# Patient Record
Sex: Male | Born: 1955 | Race: White | Hispanic: No | Smoking: Current every day smoker
Health system: Southern US, Community
[De-identification: ages and names within clinical notes are randomized; demographics above are authoritative.]

## PROBLEM LIST (undated history)

## (undated) DIAGNOSIS — M069 Rheumatoid arthritis, unspecified: Secondary | ICD-10-CM

## (undated) DIAGNOSIS — M359 Systemic involvement of connective tissue, unspecified: Secondary | ICD-10-CM

## (undated) DIAGNOSIS — I219 Acute myocardial infarction, unspecified: Secondary | ICD-10-CM

## (undated) DIAGNOSIS — E785 Hyperlipidemia, unspecified: Secondary | ICD-10-CM

## (undated) HISTORY — PX: LEG SURGERY: SHX1003

---

## 2005-02-16 ENCOUNTER — Emergency Department: Payer: Self-pay | Admitting: Internal Medicine

## 2005-02-23 ENCOUNTER — Emergency Department: Payer: Self-pay | Admitting: Internal Medicine

## 2005-03-21 ENCOUNTER — Emergency Department: Payer: Self-pay | Admitting: Emergency Medicine

## 2005-03-25 ENCOUNTER — Emergency Department: Payer: Self-pay | Admitting: General Practice

## 2005-04-10 ENCOUNTER — Emergency Department: Payer: Self-pay | Admitting: Emergency Medicine

## 2006-05-11 ENCOUNTER — Emergency Department: Payer: Self-pay | Admitting: General Practice

## 2007-11-09 ENCOUNTER — Emergency Department: Payer: Self-pay | Admitting: Emergency Medicine

## 2008-07-09 ENCOUNTER — Emergency Department: Payer: Self-pay | Admitting: Emergency Medicine

## 2009-01-24 ENCOUNTER — Emergency Department: Payer: Self-pay | Admitting: Emergency Medicine

## 2009-03-06 ENCOUNTER — Emergency Department: Payer: Self-pay | Admitting: Emergency Medicine

## 2009-04-24 ENCOUNTER — Emergency Department: Payer: Self-pay | Admitting: Emergency Medicine

## 2009-07-18 ENCOUNTER — Emergency Department: Payer: Self-pay | Admitting: Internal Medicine

## 2010-01-25 ENCOUNTER — Emergency Department: Payer: Self-pay | Admitting: Emergency Medicine

## 2010-04-14 ENCOUNTER — Emergency Department (HOSPITAL_COMMUNITY): Admission: EM | Admit: 2010-04-14 | Discharge: 2010-04-14 | Payer: Self-pay | Admitting: Emergency Medicine

## 2010-04-17 ENCOUNTER — Emergency Department: Payer: Self-pay | Admitting: Emergency Medicine

## 2010-04-21 ENCOUNTER — Emergency Department (HOSPITAL_COMMUNITY): Admission: EM | Admit: 2010-04-21 | Discharge: 2010-04-21 | Payer: Self-pay | Admitting: Emergency Medicine

## 2010-04-21 ENCOUNTER — Emergency Department: Payer: Self-pay | Admitting: Emergency Medicine

## 2010-06-05 ENCOUNTER — Emergency Department: Payer: Self-pay | Admitting: Emergency Medicine

## 2010-12-22 ENCOUNTER — Emergency Department: Payer: Self-pay | Admitting: Emergency Medicine

## 2011-01-11 ENCOUNTER — Emergency Department (HOSPITAL_COMMUNITY)
Admission: EM | Admit: 2011-01-11 | Discharge: 2011-01-11 | Disposition: A | Discharge status: Home / Self Care | Payer: Self-pay | Attending: Emergency Medicine | Admitting: Emergency Medicine

## 2011-01-11 DIAGNOSIS — K029 Dental caries, unspecified: Secondary | ICD-10-CM | POA: Insufficient documentation

## 2011-01-11 DIAGNOSIS — K089 Disorder of teeth and supporting structures, unspecified: Secondary | ICD-10-CM | POA: Insufficient documentation

## 2011-01-12 ENCOUNTER — Emergency Department: Payer: Self-pay | Admitting: Unknown Physician Specialty

## 2011-02-17 LAB — URINALYSIS, ROUTINE W REFLEX MICROSCOPIC
Glucose, UA: NEGATIVE mg/dL
Hgb urine dipstick: NEGATIVE
Ketones, ur: NEGATIVE mg/dL
Nitrite: NEGATIVE
Protein, ur: NEGATIVE mg/dL
Specific Gravity, Urine: 1.038 — ABNORMAL HIGH (ref 1.005–1.030)
Urobilinogen, UA: 1 mg/dL (ref 0.0–1.0)
pH: 5 (ref 5.0–8.0)

## 2011-04-22 ENCOUNTER — Emergency Department (HOSPITAL_COMMUNITY)
Admission: EM | Admit: 2011-04-22 | Discharge: 2011-04-23 | Disposition: A | Discharge status: Home / Self Care | Payer: Self-pay | Attending: Emergency Medicine | Admitting: Emergency Medicine

## 2011-04-22 DIAGNOSIS — K089 Disorder of teeth and supporting structures, unspecified: Secondary | ICD-10-CM | POA: Insufficient documentation

## 2011-04-22 DIAGNOSIS — K0381 Cracked tooth: Secondary | ICD-10-CM | POA: Insufficient documentation

## 2011-04-22 DIAGNOSIS — K029 Dental caries, unspecified: Secondary | ICD-10-CM | POA: Insufficient documentation

## 2011-07-19 ENCOUNTER — Emergency Department: Payer: Self-pay | Admitting: *Deleted

## 2011-12-22 ENCOUNTER — Emergency Department: Payer: Self-pay | Admitting: Emergency Medicine

## 2012-03-28 ENCOUNTER — Emergency Department: Payer: Self-pay | Admitting: Emergency Medicine

## 2012-04-26 ENCOUNTER — Emergency Department (HOSPITAL_COMMUNITY)
Admission: EM | Admit: 2012-04-26 | Discharge: 2012-04-26 | Disposition: A | Discharge status: Home / Self Care | Payer: Self-pay | Attending: Emergency Medicine | Admitting: Emergency Medicine

## 2012-04-26 ENCOUNTER — Encounter (HOSPITAL_COMMUNITY): Payer: Self-pay | Admitting: *Deleted

## 2012-04-26 DIAGNOSIS — X500XXA Overexertion from strenuous movement or load, initial encounter: Secondary | ICD-10-CM | POA: Insufficient documentation

## 2012-04-26 DIAGNOSIS — M545 Low back pain, unspecified: Secondary | ICD-10-CM | POA: Insufficient documentation

## 2012-04-26 DIAGNOSIS — T148XXA Other injury of unspecified body region, initial encounter: Secondary | ICD-10-CM | POA: Insufficient documentation

## 2012-04-26 DIAGNOSIS — R109 Unspecified abdominal pain: Secondary | ICD-10-CM | POA: Insufficient documentation

## 2012-04-26 MED ORDER — HYDROCODONE-ACETAMINOPHEN 5-325 MG PO TABS
1.0000 | ORAL_TABLET | Freq: Once | ORAL | Status: AC
  Administered 2012-04-26: 1 via ORAL
  Filled 2012-04-26: qty 1

## 2012-04-26 MED ORDER — CYCLOBENZAPRINE HCL 10 MG PO TABS
10.0000 mg | ORAL_TABLET | Freq: Once | ORAL | Status: AC
  Administered 2012-04-26: 10 mg via ORAL
  Filled 2012-04-26: qty 1

## 2012-04-26 MED ORDER — HYDROCODONE-ACETAMINOPHEN 5-500 MG PO TABS: 1.0000 | ORAL_TABLET | Freq: Four times a day (QID) | ORAL | Status: DC | PRN

## 2012-04-26 MED ORDER — CYCLOBENZAPRINE HCL 10 MG PO TABS: 10.0000 mg | ORAL_TABLET | Freq: Two times a day (BID) | ORAL | Status: DC | PRN

## 2012-04-26 MED ORDER — IBUPROFEN 800 MG PO TABS
800.0000 mg | ORAL_TABLET | Freq: Once | ORAL | Status: AC
  Administered 2012-04-26: 800 mg via ORAL
  Filled 2012-04-26: qty 1

## 2012-04-26 NOTE — Discharge Instructions (Signed)
Sprain A sprain happens when the bands of tissue that connect bones and hold joints together (ligaments) stretch too much or tear. HOME CARE  Raise (elevate) the injured area to lessen puffiness (swelling).   Put ice on the injured area.   Put ice in a plastic bag.   Place a towel between your skin and the bag.   Leave the ice on for 15 to 20 minutes, 3 to 4 times a day.   Do this for the first 24 hours or as told by your doctor.   Wear any splints, braces, castings, or elastic wraps as told by your child's doctor.   Eat healthy foods.   Only take medicine as told by your doctor.  GET HELP RIGHT AWAY IF:   There is numbness or tingling in the injured limb.   The toes or fingers become blue or white in the injured limb.   The sprained limb is cold to the touch.   There is a sharp, shooting pain in the injured limb.   The puffiness is getting worse instead of better.  MAKE SURE YOU:   Understand these instructions.   Will watch this condition.   Will get help right away if you are not doing well or get worse.  Document Released: 05/05/2008 Document Revised: 11/06/2011 Document Reviewed: 10/03/2009 [facility] Patient Information 2012 [place], [place].Muscle Strain A muscle strain (pulled muscle) happens when a muscle is over-stretched. Recovery usually takes 5 to 6 weeks.  HOME CARE   Put ice on the injured area.   Put ice in a plastic bag.   Place a towel between your skin and the bag.   Leave the ice on for 15 to 20 minutes at a time, every hour for the first 2 days.   Do not use the muscle for several days or until your doctor says you can. Do not use the muscle if you have pain.   Wrap the injured area with an elastic bandage for comfort. Do not put it on too tightly.   Only take medicine as told by your doctor.   Warm up before exercise. This helps prevent muscle strains.  GET HELP RIGHT AWAY IF:  There is increased pain or puffiness (swelling) in the  affected area. MAKE SURE YOU:   Understand these instructions.   Will watch your condition.   Will get help right away if you are not doing well or get worse.  Document Released: 08/26/2008 Document Revised: 11/06/2011 Document Reviewed: 08/26/2008 [facility] Patient Information 2012 [place], [place].

## 2012-04-26 NOTE — ED Provider Notes (Signed)
History   This chart was scribed for Gwyneth Sprout, MD by Brooks Sailors. The patient was seen in room STRE4/STRE4. Patient's care was started at 1040.   CSN: [id number]  Arrival date & time 04/26/12  1040   First MD Initiated Contact with Patient 04/26/12 1122      Chief Complaint  Patient presents with  . Back Pain    (Consider location/radiation/quality/duration/timing/severity/associated sxs/prior treatment) HPI  Paul Day is a 56 y.o. male who presents to the Emergency Department complaining of constant moderate back pain onset 30 minutes ago from an injury. Pt was helping carry a table down stairs when he missed a step putting the full weight of the table on his back. Pt says he feels like he "twisted" his back. Pt says the pain is most severe in his lower back. 9/10 on the pain scale.  Denies loss of bladder/bowel control. Pt says the pain does not radiate and denies pain in the extremities. Has not taken any medications today for pain.   History reviewed. No pertinent past medical history.  History reviewed. No pertinent past surgical history.  History reviewed. No pertinent family history.  History  Substance Use Topics  . Smoking status: Current Some Day Smoker  . Smokeless tobacco: Not on file  . Alcohol Use: No      Review of Systems  All other systems reviewed and are negative.    Allergies  Review of patient's allergies indicates not on file.  Home Medications  No current outpatient prescriptions on file.  BP 128/78  Pulse 74  Temp(Src) 97.4 F (36.3 C) (Oral)  Resp 14  SpO2 99%  Physical Exam  Nursing note and vitals reviewed. Constitutional: He is oriented to person, place, and time. He appears well-developed and well-nourished. No distress.  HENT:  Head: Normocephalic and atraumatic.  Eyes: EOM are normal.  Neck: Neck supple. No tracheal deviation present.  Cardiovascular: Normal rate.   Pulmonary/Chest: Effort normal. No respiratory  distress.  Abdominal: Soft. There is tenderness (subrapubic, mild.).  Musculoskeletal: Normal range of motion. He exhibits tenderness.       Significant tenderness left para lumbar. Normal strength in legs.   Neurological: He is alert and oriented to person, place, and time.  Skin: Skin is warm and dry.  Psychiatric: He has a normal mood and affect. His behavior is normal.    ED Course  Procedures (including critical care time)  Pt seen at 1136. Pt advised to avoid heavy lifting and exertion over the next few days.   Labs Reviewed - No data to display No results found.   1. Muscle strain       MDM   Patient with a mechanical injury to the back today while he was lifting a table and twisted. He has had symptoms suggestive of muscle strain with spasm in the lumbar area. He denies any neurologic issues. He has normal strength and sensation on exam. Patient has some mild suprapubic pain on abdominal exam however he states that it is always there because he has kidney stones. He states that was there before his injury and is not bothering him anymore than usual.  Patient was given supportive care for muscle strain and followup precautions.      I personally performed the services described in this documentation, which was scribed in my presence.  The recorded information has been reviewed and considered.    Gwyneth Sprout, MD 04/26/12 1142

## 2012-04-26 NOTE — ED Notes (Signed)
States he was helping to lift some tables and twisted his back. C/o lower back pain . Patient was ambulatory to ed.

## 2012-05-03 ENCOUNTER — Encounter (HOSPITAL_COMMUNITY): Payer: Self-pay | Admitting: Emergency Medicine

## 2012-05-03 ENCOUNTER — Emergency Department (HOSPITAL_COMMUNITY)
Admission: EM | Admit: 2012-05-03 | Discharge: 2012-05-03 | Disposition: A | Discharge status: Home / Self Care | Payer: Self-pay | Attending: Emergency Medicine | Admitting: Emergency Medicine

## 2012-05-03 ENCOUNTER — Emergency Department (HOSPITAL_COMMUNITY): Payer: Self-pay

## 2012-05-03 DIAGNOSIS — F17200 Nicotine dependence, unspecified, uncomplicated: Secondary | ICD-10-CM | POA: Insufficient documentation

## 2012-05-03 DIAGNOSIS — W010XXA Fall on same level from slipping, tripping and stumbling without subsequent striking against object, initial encounter: Secondary | ICD-10-CM | POA: Insufficient documentation

## 2012-05-03 DIAGNOSIS — S39012A Strain of muscle, fascia and tendon of lower back, initial encounter: Secondary | ICD-10-CM

## 2012-05-03 DIAGNOSIS — R51 Headache: Secondary | ICD-10-CM | POA: Insufficient documentation

## 2012-05-03 DIAGNOSIS — M25519 Pain in unspecified shoulder: Secondary | ICD-10-CM | POA: Insufficient documentation

## 2012-05-03 DIAGNOSIS — Y92009 Unspecified place in unspecified non-institutional (private) residence as the place of occurrence of the external cause: Secondary | ICD-10-CM | POA: Insufficient documentation

## 2012-05-03 DIAGNOSIS — S335XXA Sprain of ligaments of lumbar spine, initial encounter: Secondary | ICD-10-CM | POA: Insufficient documentation

## 2012-05-03 DIAGNOSIS — M069 Rheumatoid arthritis, unspecified: Secondary | ICD-10-CM | POA: Insufficient documentation

## 2012-05-03 HISTORY — DX: Rheumatoid arthritis, unspecified: M06.9

## 2012-05-03 MED ORDER — OXYCODONE-ACETAMINOPHEN 5-325 MG PO TABS
1.0000 | ORAL_TABLET | ORAL | Status: AC
  Administered 2012-05-03: 1 via ORAL
  Filled 2012-05-03: qty 1

## 2012-05-03 MED ORDER — OXYCODONE-ACETAMINOPHEN 5-325 MG PO TABS: 1.0000 | ORAL_TABLET | Freq: Four times a day (QID) | ORAL | Status: AC | PRN

## 2012-05-03 NOTE — ED Notes (Signed)
Patient reports slipping and falling while walking down a slippery grass hill today; fall caused lower back pain. Patient complaining of left shoulder pain for the past 3-4 months; reports history of arthritis.  Patient states that he is out of his pain medication.

## 2012-05-03 NOTE — Discharge Instructions (Signed)
Back Exercises Back exercises help treat and prevent back injuries. The goal of back exercises is to increase the strength of your abdominal and back muscles and the flexibility of your back. These exercises should be started when you no longer have back pain. Back exercises include:  Pelvic Tilt. Lie on your back with your knees bent. Tilt your pelvis until the lower part of your back is against the floor. Hold this position 5 to 10 sec and repeat 5 to 10 times.   Knee to Chest. Pull first 1 knee up against your chest and hold for 20 to 30 seconds, repeat this with the other knee, and then both knees. This may be done with the other leg straight or bent, whichever feels better.   Sit-Ups or Curl-Ups. Bend your knees 90 degrees. Start with tilting your pelvis, and do a partial, slow sit-up, lifting your trunk only 30 to 45 degrees off the floor. Take at least 2 to 3 seconds for each sit-up. Do not do sit-ups with your knees out straight. If partial sit-ups are difficult, simply do the above but with only tightening your abdominal muscles and holding it as directed.   Hip-Lift. Lie on your back with your knees flexed 90 degrees. Push down with your feet and shoulders as you raise your hips a couple inches off the floor; hold for 10 seconds, repeat 5 to 10 times.   Back arches. Lie on your stomach, propping yourself up on bent elbows. Slowly press on your hands, causing an arch in your low back. Repeat 3 to 5 times. Any initial stiffness and discomfort should lessen with repetition over time.   Shoulder-Lifts. Lie face down with arms beside your body. Keep hips and torso pressed to floor as you slowly lift your head and shoulders off the floor.  Do not overdo your exercises, especially in the beginning. Exercises may cause you some mild back discomfort which lasts for a few minutes; however, if the pain is more severe, or lasts for more than 15 minutes, do not continue exercises until you see your  caregiver. Improvement with exercise therapy for back problems is slow.  See your caregivers for assistance with developing a proper back exercise program. Document Released: 12/25/2004 Document Revised: 11/06/2011 Document Reviewed: 11/17/2005 [facility] Patient Information 2012 [place], [place]. RESOURCE GUIDE  Chronic Pain Problems: Contact [facility]  [phone] Patients need to be referred by their primary care doctor.  Insufficient Money for Medicine: Contact United Way:  call "211" or [facility] 760-524-8704.  No Primary Care Doctor: - Call Health Connect  [phone] - can help you locate a primary care doctor that  accepts your insurance, provides certain services, etc. - Physician Referral [phone]  Agencies that provide inexpensive medical care: - [facility]  [phone] - [facility]  [phone] - [facility]  [phone] [facility]  [phone] - [facility]  [phone] [facility]  [phone]  Medicaid-accepting [facility] Providers: - [facility]- [address]  [phone], Mon-Fri 9am-7pm, Sat 9am-1pm - [facility]- [address] [place], Suite [place]  [phone] - [facility]- [address], [address] [place]  [phone] [facility]- [address]  [phone] - Renaye Rakers- [address] [place], [address], [phone]  Only accepts [place] [facility] [place] patients after they have their name  applied to  their card  Self Pay (no insurance) in [facility]: - Sickle Cell Patients: Dr Willey Blade, [facility] - [facility]  [address] [place], [phone] - [facility]- [address] [place]  [phone]       Patrcia Dolly [facility]gent Care [place]- [address]       -     [facility]- see information above (Speak to [employer] if you do not have  insurance)       -  [facility]- [address] [place], [phone]       -  [facility] [place]- [address],  [phone]       -  [facility]- [address] [phone]       -  Dr Julio Sicks-  [address], [place], [phone]       -  [facility]- [address], [phone]       -  [facility]- [address] [phone], also [address], [phone]       -    [facility]- [address] [place], [phone], 1st & 3rd Saturday   every month, 10am-1pm  1) Find a Doctor and Pay Out of Pocket Although you won't have to find out who is covered by your insurance plan, it is a good idea to ask around and get recommendations. You will then need to call the office and see if the doctor you have chosen will accept you as a new patient and what types of options they offer for patients who are self-pay. Some doctors offer discounts or will set up payment plans for their patients who do not have insurance, but you will need to ask so you aren't surprised when you get to your appointment.  2) Contact Your Local Health Department Not all health departments have doctors that can see patients for sick visits, but many do, so it is worth a call to see if yours does. If you don't know where your local health department is, you can check in your phone book. The CDC also has a tool to help you locate your state's health department, and many state websites also have listings of all of their local health departments.  3) Find a Walk-in Clinic If your illness is not likely to be very severe or complicated, you may want to try a walk in clinic. These are popping up all over the country in pharmacies, drugstores, and shopping centers. They're usually staffed by nurse practitioners or physician assistants that have been trained to treat common illnesses and complaints. They're usually fairly quick and inexpensive. However, if you have serious  medical issues or chronic medical problems, these are probably not your best option  STD Testing - [facility] Department of [facility], STD Clinic, [address] [place], phone [phone] or [phone].  Monday - Friday, call for an appointment. [facility] Department of [employer], STD Clinic, [address], [place], phone [phone] or [phone].  Monday - Friday, call for an appointment.  Abuse/Neglect: [facility] Child Abuse Hotline (469)334-4794 [facility] Child Abuse Hotline (281) 856-9201 (After Hours)  Emergency Shelter:  [facility] 660-166-3166  Maternity Homes: - Room at the [place] [facility] 731-486-2597 - [facility] (913)689-1319  MRSA Hotline #:  161-0960  [facility] Resources  [facility] [place]  United Way [facility] Dept. [address]                 [address]         [address] [place] [address]  Blondell Reveal Phone:  [phone]                                  Phone:  [phone]                   Phone:  [phone]  [facility], [phone] - [place] - [phone]       -     [facility] in [place], [address]                                  [phone], [facility] Child Abuse Hotline (639)522-0552 or (205)019-9374 (After Hours)   Behavioral Health Services  Substance Abuse Resources: - [facility]  225-342-7260 - [facility] (725) [phone] - [facility] 743-619-3076 Floydene Flock (631) [phone] - Residential & Outpatient Substance Abuse Program  [phone]  Psychological Services: [facility]  [phone] Services  [phone] - [facility], [phone] [place]. [address], [place], ACCESS LINE: 2286460286 or (504) [phone], [employer]  Dental Assistance  If unable to pay or uninsured, contact:  [facility] or [facility]. to become qualified for the adult dental clinic.  Patients with Medicaid: [facility] [phone] [address], [phone] [address] [phone]  If unable to pay, or uninsured, contact [facility] [phone]) or [facility] Department [phone] in [place], [phone] in [facility]) to become qualified for the adult dental clinic  Other Low-Cost Community Dental Services: - [facility]- [address] [place], [place], [place], [address], [phone], Ext. [phone], 2nd and 4th Thursday of the month at 6:30am.  10 clients each day by appointment, can sometimes see walk-in patients if someone does not show for an appointment. [facility]- [address] Ether Griffins [place], [place], [address], [phone] - [facility]- [address] [place], [place], [address], [phone] - [facility]- [phone] [facility]- [phone] [facility] Department- [phone]

## 2012-05-03 NOTE — ED Provider Notes (Signed)
History     CSN: [id number]  Arrival date & time 05/03/12  2104   First MD Initiated Contact with Patient 05/03/12 2155      Chief Complaint  Patient presents with  . Back Pain  . Shoulder Pain    (Consider location/radiation/quality/duration/timing/severity/associated sxs/prior treatment) Patient is a 56 y.o. male presenting with fall. The history is provided by the patient.  Fall The accident occurred 6 to 12 hours ago. The fall occurred while walking. Distance fallen: from standing. He landed on grass. There was no blood loss. Point of impact: buttocks. Pain location: low back. The pain is moderate. He was ambulatory at the scene. There was no entrapment after the fall. Associated symptoms include headaches (mild). Pertinent negatives include no fever, no numbness, no abdominal pain, no bowel incontinence, no nausea, no vomiting, no hematuria and no loss of consciousness. Treatments tried: vicodin. The treatment provided mild relief.    Past Medical History  Diagnosis Date  . Rheumatoid arthritis     History reviewed. No pertinent past surgical history.  History reviewed. No pertinent family history.  History  Substance Use Topics  . Smoking status: Current Some Day Smoker -- 1.0 packs/day  . Smokeless tobacco: Not on file  . Alcohol Use: No      Review of Systems  Constitutional: Negative for fever.  HENT: Negative for rhinorrhea, drooling and neck pain.   Eyes: Negative for pain.  Respiratory: Negative for cough and shortness of breath.   Cardiovascular: Negative for chest pain and leg swelling.  Gastrointestinal: Negative for nausea, vomiting, abdominal pain, diarrhea and bowel incontinence.  Genitourinary: Negative for dysuria and hematuria.  Musculoskeletal: Negative for gait problem.  Skin: Negative for color change.  Neurological: Positive for headaches (mild). Negative for loss of consciousness and numbness.  Hematological: Negative for adenopathy.    Psychiatric/Behavioral: Negative for behavioral problems.  All other systems reviewed and are negative.    Allergies  Review of patient's allergies indicates no known allergies.  Home Medications   Current Outpatient Rx  Name Route Sig Dispense Refill  . CYCLOBENZAPRINE HCL 10 MG PO TABS Oral Take 10 mg by mouth 2 (two) times daily as needed. For muscle spasms.    Marland Kitchen HYDROCODONE-ACETAMINOPHEN 5-500 MG PO TABS Oral Take 1-2 tablets by mouth every 6 (six) hours as needed. For pain.    . OXYCODONE-ACETAMINOPHEN 5-325 MG PO TABS Oral Take 1 tablet by mouth every 6 (six) hours as needed for pain. 15 tablet 0    BP 124/71  Pulse 70  Temp(Src) 97.9 F (36.6 C) (Oral)  Resp 20  SpO2 98%  Physical Exam  Nursing note and vitals reviewed. Constitutional: He is oriented to person, place, and time. He appears well-developed and well-nourished.  HENT:  Head: Normocephalic and atraumatic.  Right Ear: External ear normal.  Left Ear: External ear normal.  Nose: Nose normal.  Mouth/Throat: Oropharynx is clear and moist. No oropharyngeal exudate.  Eyes: Conjunctivae and EOM are normal. Pupils are equal, round, and reactive to light.  Neck: Normal range of motion. Neck supple.  Cardiovascular: Normal rate, regular rhythm, normal heart sounds and intact distal pulses.  Exam reveals no gallop and no friction rub.   No murmur heard. Pulmonary/Chest: Effort normal and breath sounds normal. No respiratory distress. He has no wheezes.  Abdominal: Soft. Bowel sounds are normal. He exhibits no distension. There is no tenderness. There is no rebound and no guarding.  Musculoskeletal: Normal range of motion. He exhibits no  edema and no tenderness.       Mild ttp of paraspinal lumbar region. Mild ttp of lumbar spinal area.   Neurological: He is alert and oriented to person, place, and time.  Skin: Skin is warm and dry.  Psychiatric: He has a normal mood and affect. His behavior is normal.    ED  Course  Procedures (including critical care time)  Labs Reviewed - No data to display Dg Lumbar Spine Complete  05/03/2012  *RADIOLOGY REPORT*  Clinical Data: Larey Seat.  Back pain.  LUMBAR SPINE - COMPLETE 4+ VIEW  Comparison: None  Findings: Normal alignment of the lumbar vertebral bodies.  No acute fracture.  Mild degenerative disc disease and facet disease. The visualized bony pelvis is intact.  IMPRESSION: Normal alignment and no acute bony findings.  Original Report Authenticated By: P. Loralie Champagne, M.D.     1. Back strain       MDM  11:36 PM 56 y.o. male pw worsening back pain. Pt seen here on 04/26/12 after her twisted his back while lifting a table, pt given vicodin for MSK pain. Pt states that he slipped while walking down a grassy hill today and landed on his buttocks. He notes worsening back pain now. Pt requesting percocet instead of vicodin. Will get plain films and give percocet.  11:36 PM: Pt feeling much better after percocet. Imaging non-contrib.  I have discussed the diagnosis/risks/treatment options with the patient and believe the pt to be eligible for discharge home to follow-up with pcp for any worsening back pain. We also discussed returning to the ED immediately if new or worsening sx occur. We discussed the sx which are most concerning (e.g., back pain red flags discussed) that necessitate immediate return. Any new prescriptions provided to the patient are listed below.  New Prescriptions   OXYCODONE-ACETAMINOPHEN (PERCOCET) 5-325 MG PER TABLET    Take 1 tablet by mouth every 6 (six) hours as needed for pain.    Clinical Impression 1. Back strain           Purvis Sheffield, MD 05/03/12 2336

## 2012-05-03 NOTE — ED Notes (Signed)
Pt denies any radiating pain, numbness or weakness in legs

## 2012-05-04 NOTE — ED Provider Notes (Signed)
I have seen and examined this patient with the resident.  I agree with the resident's note, assessment and plan except as indicated.    Patient with back pain and shoulder pain after 2 separate falls.  Patient had no neurologic deficits here.  He is improved with Percocet here he'll be safely discharged home.  Paul Christen, MD 05/04/12 [phone]

## 2012-12-26 ENCOUNTER — Emergency Department: Payer: Self-pay | Admitting: Unknown Physician Specialty

## 2012-12-26 LAB — COMPREHENSIVE METABOLIC PANEL
Alkaline Phosphatase: 126 U/L (ref 50–136)
Bilirubin,Total: 0.2 mg/dL (ref 0.2–1.0)
Creatinine: 0.98 mg/dL (ref 0.60–1.30)
EGFR (Non-African Amer.): >60
Osmolality: 276 (ref 275–301)
Sodium: 139 mmol/L (ref 136–145)

## 2012-12-26 LAB — URINALYSIS, COMPLETE
Bacteria: NONE SEEN
Glucose,UR: NEGATIVE mg/dL (ref 0–75)
Ketone: NEGATIVE
Nitrite: NEGATIVE
Ph: 5 (ref 4.5–8.0)
RBC,UR: 2470 /HPF (ref 0–5)

## 2012-12-26 LAB — CBC
MCH: 29.7 pg (ref 26.0–34.0)
Platelet: 231 10*3/uL (ref 150–440)
RBC: 4.54 10*6/uL (ref 4.40–5.90)
WBC: 7.7 10*3/uL (ref 3.8–10.6)

## 2012-12-27 ENCOUNTER — Emergency Department: Payer: Self-pay | Admitting: Emergency Medicine

## 2012-12-27 LAB — URINALYSIS, COMPLETE
Bilirubin,UR: NEGATIVE
Glucose,UR: NEGATIVE mg/dL (ref 0–75)
Protein: NEGATIVE
Specific Gravity: 1.014 (ref 1.003–1.030)
Squamous Epithelial: <1
WBC UR: 3 /HPF (ref 0–5)

## 2013-08-01 ENCOUNTER — Emergency Department: Payer: Self-pay | Admitting: Emergency Medicine

## 2013-08-01 LAB — CBC
MCH: 29.5 pg (ref 26.0–34.0)
MCHC: 33.9 g/dL (ref 32.0–36.0)
MCV: 87 fL (ref 80–100)
Platelet: 220 10*3/uL (ref 150–440)
RBC: 4.59 10*6/uL (ref 4.40–5.90)

## 2013-08-01 LAB — BASIC METABOLIC PANEL
Anion Gap: 6 — ABNORMAL LOW (ref 7–16)
Calcium, Total: 9.1 mg/dL (ref 8.5–10.1)
Co2: 25 mmol/L (ref 21–32)
Creatinine: 1.17 mg/dL (ref 0.60–1.30)
EGFR (Non-African Amer.): >60
Osmolality: 284 (ref 275–301)
Potassium: 4.1 mmol/L (ref 3.5–5.1)

## 2013-08-15 ENCOUNTER — Emergency Department: Payer: Self-pay | Admitting: Emergency Medicine

## 2013-08-15 LAB — URINALYSIS, COMPLETE
Bilirubin,UR: NEGATIVE
Ph: 5 (ref 4.5–8.0)
Protein: 25

## 2013-09-07 ENCOUNTER — Emergency Department: Payer: Self-pay | Admitting: Emergency Medicine

## 2013-11-28 ENCOUNTER — Encounter (HOSPITAL_COMMUNITY): Payer: Self-pay | Admitting: Emergency Medicine

## 2013-11-28 ENCOUNTER — Emergency Department (HOSPITAL_COMMUNITY)
Admission: EM | Admit: 2013-11-28 | Discharge: 2013-11-28 | Disposition: A | Discharge status: Home / Self Care | Payer: Self-pay | Attending: Emergency Medicine | Admitting: Emergency Medicine

## 2013-11-28 DIAGNOSIS — F17200 Nicotine dependence, unspecified, uncomplicated: Secondary | ICD-10-CM | POA: Insufficient documentation

## 2013-11-28 DIAGNOSIS — Z87442 Personal history of urinary calculi: Secondary | ICD-10-CM | POA: Insufficient documentation

## 2013-11-28 DIAGNOSIS — Z8739 Personal history of other diseases of the musculoskeletal system and connective tissue: Secondary | ICD-10-CM | POA: Insufficient documentation

## 2013-11-28 DIAGNOSIS — N390 Urinary tract infection, site not specified: Secondary | ICD-10-CM | POA: Insufficient documentation

## 2013-11-28 LAB — URINALYSIS, ROUTINE W REFLEX MICROSCOPIC
Nitrite: NEGATIVE
Specific Gravity, Urine: 1.023 (ref 1.005–1.030)
Urobilinogen, UA: 0.2 mg/dL (ref 0.0–1.0)

## 2013-11-28 LAB — URINE MICROSCOPIC-ADD ON

## 2013-11-28 MED ORDER — CIPROFLOXACIN HCL 500 MG PO TABS
500.0000 mg | ORAL_TABLET | Freq: Two times a day (BID) | ORAL | Status: DC
Start: 2013-11-28 — End: 2016-01-28

## 2013-11-28 MED ORDER — PHENAZOPYRIDINE HCL 200 MG PO TABS: 200.0000 mg | ORAL_TABLET | Freq: Three times a day (TID) | ORAL | Status: DC

## 2013-11-28 MED ORDER — PHENAZOPYRIDINE HCL 100 MG PO TABS
100.0000 mg | ORAL_TABLET | Freq: Once | ORAL | Status: AC
  Administered 2013-11-28: 100 mg via ORAL
  Filled 2013-11-28: qty 1

## 2013-11-28 MED ORDER — CIPROFLOXACIN HCL 500 MG PO TABS
500.0000 mg | ORAL_TABLET | Freq: Once | ORAL | Status: AC
  Administered 2013-11-28: 500 mg via ORAL
  Filled 2013-11-28: qty 1

## 2013-11-28 NOTE — ED Notes (Signed)
Onset today dysuria and "hard to urinate" stated by patient. Pain currently at rest 0/10 upon urinating pain 10/10 burning.

## 2013-11-28 NOTE — ED Notes (Signed)
Pt up ambulatory to the bathroom to attempt to provide an urine specimen

## 2013-11-28 NOTE — ED Provider Notes (Addendum)
CSN: [id number]     Arrival date & time 11/28/13  4098 History   None    Chief Complaint  Patient presents with  . Dysuria    HPI  Patient states "it hurts when I piss".  He states morning he felt like he had to bear down at some pain. He does not use an empty his bladder. He has not had frequency. Her second time urinated about an hour or so later he states it is still painful. The only similar episode he has had  was when he had a kidney stone in the past. He states that it burns when the urine comes out. He has no flank pain. No nausea vomiting or fever.  Past Medical History  Diagnosis Date  . Rheumatoid arthritis(714.0)    History reviewed. No pertinent past surgical history. No family history on file. History  Substance Use Topics  . Smoking status: Current Some Day Smoker -- 1.00 packs/day  . Smokeless tobacco: Not on file  . Alcohol Use: No    Review of Systems  Constitutional: Negative for fever, chills, diaphoresis, appetite change and fatigue.  HENT: Negative for mouth sores, sore throat and trouble swallowing.   Eyes: Negative for visual disturbance.  Respiratory: Negative for cough, chest tightness, shortness of breath and wheezing.   Cardiovascular: Negative for chest pain.  Gastrointestinal: Negative for nausea, vomiting, abdominal pain, diarrhea and abdominal distention.  Endocrine: Negative for polydipsia, polyphagia and polyuria.  Genitourinary: Positive for dysuria and difficulty urinating. Negative for frequency and hematuria.  Musculoskeletal: Negative for gait problem.  Skin: Negative for color change, pallor and rash.  Neurological: Negative for dizziness, syncope, light-headedness and headaches.  Hematological: Does not bruise/bleed easily.  Psychiatric/Behavioral: Negative for behavioral problems and confusion.    Allergies  Review of patient's allergies indicates no known allergies.  Home Medications   Current Outpatient Rx  Name  Route  Sig   Dispense  Refill  . ciprofloxacin (CIPRO) 500 MG tablet   Oral   Take 1 tablet (500 mg total) by mouth every 12 (twelve) hours.   10 tablet   0   . phenazopyridine (PYRIDIUM) 200 MG tablet   Oral   Take 1 tablet (200 mg total) by mouth 3 (three) times daily.   6 tablet   0    BP 117/68  Pulse 72  Temp(Src) 98 F (36.7 C) (Oral)  Resp 18  SpO2 95% Physical Exam  Constitutional: He is oriented to person, place, and time. He appears well-developed and well-nourished. No distress.  HENT:  Head: Normocephalic.  Eyes: Conjunctivae are normal. Pupils are equal, round, and reactive to light. No scleral icterus.  Neck: Normal range of motion. Neck supple. No thyromegaly present.  Cardiovascular: Normal rate and regular rhythm.  Exam reveals no gallop and no friction rub.   No murmur heard. Pulmonary/Chest: Effort normal and breath sounds normal. No respiratory distress. He has no wheezes. He has no rales.  Abdominal: Soft. Bowel sounds are normal. He exhibits no distension. There is no tenderness. There is no rebound.  No suprapubic pain. No flank pain. Nontender the abdomen.  Musculoskeletal: Normal range of motion.  Neurological: He is alert and oriented to person, place, and time.  Skin: Skin is warm and dry. No rash noted.  Psychiatric: He has a normal mood and affect. His behavior is normal.    ED Course  Procedures (including critical care time) Labs Review Labs Reviewed  URINALYSIS, ROUTINE W REFLEX MICROSCOPIC -  Abnormal; Notable for the following:    Color, Urine AMBER (*)    APPearance CLOUDY (*)    Hgb urine dipstick LARGE (*)    Ketones, ur 15 (*)    Leukocytes, UA MODERATE (*)    All other components within normal limits  URINE MICROSCOPIC-ADD ON - Abnormal; Notable for the following:    Bacteria, UA FEW (*)    All other components within normal limits  URINE CULTURE   Imaging Review No results found.  EKG Interpretation   None       MDM   1.  Urinary tract infection     Limited bedside ultrasound by myself shows no asymmetry of the pelvic calyces. No unilateral hydronephrosis. No distention of the urinary bladder. He is nontender over the bladder to examine or with the ultrasound.  UA returned with white blood cells, red blood cells, bacteria, and leukocytes. Culture pending. He has not had a perineal pain is unexpected prostatitis. He does have dysuria. Does not have pain or colic not expect the stone. Plan will be empirically for urinary tract infection with Pyridium and Cipro.    Rolland Porter, MD 11/28/13 1610  Rolland Porter, MD 11/28/13 [id number]

## 2013-11-28 NOTE — ED Notes (Signed)
Pt undressed, in gown, on continuous pulse oximetry and blood pressure cuff 

## 2013-11-29 LAB — URINE CULTURE: Colony Count: NO GROWTH

## 2014-01-06 ENCOUNTER — Emergency Department: Payer: Self-pay | Admitting: Emergency Medicine

## 2014-01-06 LAB — BASIC METABOLIC PANEL
ANION GAP: 6 — AB (ref 7–16)
BUN: 13 mg/dL (ref 7–18)
CHLORIDE: 108 mmol/L — AB (ref 98–107)
CO2: 27 mmol/L (ref 21–32)
Calcium, Total: 8.8 mg/dL (ref 8.5–10.1)
Creatinine: 0.85 mg/dL (ref 0.60–1.30)
Glucose: 163 mg/dL — ABNORMAL HIGH (ref 65–99)
OSMOLALITY: 285 (ref 275–301)
Potassium: 3.7 mmol/L (ref 3.5–5.1)
SODIUM: 141 mmol/L (ref 136–145)

## 2014-01-06 LAB — CBC
HCT: 38.1 % — AB (ref 40.0–52.0)
HGB: 12.7 g/dL — ABNORMAL LOW (ref 13.0–18.0)
MCH: 28.6 pg (ref 26.0–34.0)
MCHC: 33.3 g/dL (ref 32.0–36.0)
MCV: 86 fL (ref 80–100)
PLATELETS: 234 10*3/uL (ref 150–440)
RBC: 4.43 10*6/uL (ref 4.40–5.90)
RDW: 12.8 % (ref 11.5–14.5)
WBC: 6 10*3/uL (ref 3.8–10.6)

## 2014-01-06 LAB — TROPONIN I: Troponin-I: <0.02 ng/mL

## 2014-01-07 LAB — TROPONIN I: Troponin-I: <0.02 ng/mL

## 2014-02-03 ENCOUNTER — Emergency Department: Payer: Self-pay | Admitting: Emergency Medicine

## 2014-02-18 ENCOUNTER — Emergency Department: Payer: Self-pay | Admitting: Emergency Medicine

## 2014-05-14 ENCOUNTER — Emergency Department: Payer: Self-pay | Admitting: Emergency Medicine

## 2015-02-18 ENCOUNTER — Emergency Department: Payer: Self-pay | Admitting: Physician Assistant

## 2015-10-17 ENCOUNTER — Encounter: Payer: Self-pay | Admitting: Intensive Care

## 2015-10-17 ENCOUNTER — Emergency Department
Admission: EM | Admit: 2015-10-17 | Discharge: 2015-10-17 | Disposition: A | Discharge status: Home / Self Care | Payer: Self-pay | Attending: Emergency Medicine | Admitting: Emergency Medicine

## 2015-10-17 DIAGNOSIS — K0889 Other specified disorders of teeth and supporting structures: Secondary | ICD-10-CM | POA: Insufficient documentation

## 2015-10-17 DIAGNOSIS — F17210 Nicotine dependence, cigarettes, uncomplicated: Secondary | ICD-10-CM | POA: Insufficient documentation

## 2015-10-17 DIAGNOSIS — K029 Dental caries, unspecified: Secondary | ICD-10-CM | POA: Insufficient documentation

## 2015-10-17 DIAGNOSIS — K0381 Cracked tooth: Secondary | ICD-10-CM | POA: Insufficient documentation

## 2015-10-17 DIAGNOSIS — Z79899 Other long term (current) drug therapy: Secondary | ICD-10-CM | POA: Insufficient documentation

## 2015-10-17 DIAGNOSIS — Z792 Long term (current) use of antibiotics: Secondary | ICD-10-CM | POA: Insufficient documentation

## 2015-10-17 MED ORDER — AMOXICILLIN 500 MG PO CAPS: 500.0000 mg | ORAL_CAPSULE | Freq: Three times a day (TID) | ORAL | Status: DC

## 2015-10-17 MED ORDER — TRAMADOL HCL 50 MG PO TABS: 50.0000 mg | ORAL_TABLET | Freq: Four times a day (QID) | ORAL | Status: DC | PRN

## 2015-10-17 MED ORDER — IBUPROFEN 800 MG PO TABS: 800.0000 mg | ORAL_TABLET | Freq: Three times a day (TID) | ORAL | Status: DC | PRN

## 2015-10-17 MED ORDER — IBUPROFEN 800 MG PO TABS
800.0000 mg | ORAL_TABLET | Freq: Once | ORAL | Status: AC
Start: 2015-10-17 — End: 2015-10-17
  Administered 2015-10-17: 800 mg via ORAL
  Filled 2015-10-17: qty 1

## 2015-10-17 MED ORDER — TRAMADOL HCL 50 MG PO TABS
50.0000 mg | ORAL_TABLET | Freq: Once | ORAL | Status: AC
  Administered 2015-10-17: 50 mg via ORAL
  Filled 2015-10-17: qty 1

## 2015-10-17 NOTE — ED Notes (Signed)
Patient presents with upper tooth pain X 2-3 days. Patient c/o upper lip and nose pain. Patient states his tooth broke off

## 2015-10-17 NOTE — ED Provider Notes (Signed)
[facility] Emergency Department Provider Note  ____________________________________________  Time seen: Approximately 7:47 PM  I have reviewed the triage vital signs and the nursing notes.   HISTORY  Chief Complaint Dental Pain    HPI Paul Day is a 59 y.o. male patient complain of dental pain for the last 3 days. Patient states pain is radiating from his fractured upper incisor. Patient denies any fever or chills associated with this complaint. Patient is rating his complaint as a 9/10. No palliative measures taken for this complaint.   Past Medical History  Diagnosis Date  . Rheumatoid arthritis(714.0)     Patient Active Problem List   Diagnosis Date Noted  . Rheumatoid arthritis(714.0)     Past Surgical History  Procedure Laterality Date  . Leg surgery Right     Current Outpatient Rx  Name  Route  Sig  Dispense  Refill  . amoxicillin (AMOXIL) 500 MG capsule   Oral   Take 1 capsule (500 mg total) by mouth 3 (three) times daily.   30 capsule   0   . ciprofloxacin (CIPRO) 500 MG tablet   Oral   Take 1 tablet (500 mg total) by mouth every 12 (twelve) hours.   10 tablet   0   . ibuprofen (ADVIL,MOTRIN) 800 MG tablet   Oral   Take 1 tablet (800 mg total) by mouth every 8 (eight) hours as needed for moderate pain.   15 tablet   0   . phenazopyridine (PYRIDIUM) 200 MG tablet   Oral   Take 1 tablet (200 mg total) by mouth 3 (three) times daily.   6 tablet   0   . traMADol (ULTRAM) 50 MG tablet   Oral   Take 1 tablet (50 mg total) by mouth every 6 (six) hours as needed for moderate pain.   12 tablet   0     Allergies Review of patient's allergies indicates no known allergies.  History reviewed. No pertinent family history.  Social History Social History  Substance Use Topics  . Smoking status: Current Every Day Smoker -- 1.00 packs/day    Types: Cigarettes  . Smokeless tobacco: Never Used  . Alcohol Use: No     Review of Systems Constitutional: No fever/chills Eyes: No visual changes. ENT: No sore throat. Dental pain Cardiovascular: Denies chest pain. Respiratory: Denies shortness of breath. Gastrointestinal: No abdominal pain.  No nausea, no vomiting.  No diarrhea.  No constipation. Genitourinary: Negative for dysuria. Musculoskeletal: Negative for back pain. Skin: Negative for rash. Neurological: Negative for headaches, focal weakness or numbness. 10-point ROS otherwise negative.  ____________________________________________   PHYSICAL EXAM:  VITAL SIGNS: ED Triage Vitals  Enc Vitals Group     BP 10/17/15 1946 126/72 mmHg     Pulse Rate 10/17/15 1946 98     Resp 10/17/15 1946 14     Temp 10/17/15 1946 98.5 F (36.9 C)     Temp Source 10/17/15 1946 Oral     SpO2 10/17/15 1946 98 %     Weight 10/17/15 1946 146 lb (66.225 kg)     Height --      Head Cir --      Peak Flow --      Pain Score 10/17/15 1946 9     Pain Loc --      Pain Edu? --      Excl. in GC? --     Constitutional: Alert and oriented. Well appearing and in no  acute distress. Eyes: Conjunctivae are normal. PERRL. EOMI. Head: Atraumatic. Nose: No congestion/rhinnorhea. Mouth/Throat: Mucous membranes are moist.  Oropharynx non-erythematous. Multiple fractured and carious with edematous gingiva. No abscess. Neck: No stridor.  No cervical spine tenderness to palpation. Hematological/Lymphatic/Immunilogical: No cervical lymphadenopathy. Cardiovascular: Normal rate, regular rhythm. Grossly normal heart sounds.  Good peripheral circulation. Respiratory: Normal respiratory effort.  No retractions. Lungs CTAB. Gastrointestinal: Soft and nontender. No distention. No abdominal bruits. No CVA tenderness. Musculoskeletal: No lower extremity tenderness nor edema.  No joint effusions. Neurologic:  Normal speech and language. No gross focal neurologic deficits are appreciated. No gait instability. Skin:  Skin is warm, dry  and intact. No rash noted. Psychiatric: Mood and affect are normal. Speech and behavior are normal.  ____________________________________________   LABS (all labs ordered are listed, but only abnormal results are displayed)  Labs Reviewed - No data to display ____________________________________________  EKG   ____________________________________________  RADIOLOGY   ____________________________________________   PROCEDURES  Procedure(s) performed: None  Critical Care performed: No  ____________________________________________   INITIAL IMPRESSION / ASSESSMENT AND PLAN / ED COURSE  Pertinent labs & imaging results that were available during my care of the patient were reviewed by me and considered in my medical decision making (see chart for details).  The pain secondary to fractured teeth. Patient given a list of dental clinics to choose to follow-up. Patient given a prescription for amoxicillin and ibuprofen and tramadol. ____________________________________________   FINAL CLINICAL IMPRESSION(S) / ED DIAGNOSES  Final diagnoses:  Pain due to dental caries      Joni Reining, PA-C 10/17/15 1958  Myrna Blazer, MD 10/17/15 2015

## 2015-10-17 NOTE — Discharge Instructions (Signed)
Follow-up from the list of dental clinics provided. °OPTIONS FOR DENTAL FOLLOW UP CARE ° °[place] [facility] and [facility] - Local Safety Net Dental Clinics °http://www.ncdhhs.gov/dph/oralhealth/services/safetynetclinics.htm °  °[facility] (336-562-3123) ° °[facility] [place] (919-933-9087) ° °[facility] ([phone] ext [phone]) ° °[facility] ([phone]) ° °[facility] ([phone]) °This clinic caters to the indigent population and is on a lottery system. °Location: °[facility], [address], [place] °Clinic Hours: °Wednesdays from 6pm - 9pm, patients seen by a lottery system. °For dates, call or go to www.med.[facility].edu/[facility]/patients/Dental-[facility] °Services: °Cleanings, fillings and simple extractions. °Payment Options: °DENTAL WORK IS FREE OF CHARGE. Bring proof of income or support. °Best way to get seen: °Arrive at 5:15 pm - this is a lottery, NOT first come/first serve, so arriving earlier will not increase your chances of being seen. °  °  °[facility] °[phone] °Select option 1 for emergencies °  °Location: °[facility], [address], [place] °Clinic Hours: °No walk-ins accepted - call the day before to schedule an appointment. °Check in times are 9:30 am and 1:30 pm. °Services: °Simple extractions, temporary fillings, pulpectomy/pulp debridement, uncomplicated abscess drainage. °Payment Options: °PAYMENT IS DUE AT THE TIME OF SERVICE.  Fee is usually $100-200, additional surgical procedures (e.g. abscess drainage) may be extra. °Cash, checks, Visa/MasterCard accepted.  Can file Medicaid if patient is covered for dental - patient should call case worker to check. °No discount for [facility] patients. °Best way to get seen: °MUST call the day before and get onto the schedule. Can usually be seen the next 1-2 days. No walk-ins accepted. °  °   °[facility] °[phone] °  °Location: °[facility], [address], [place] °Clinic Hours: °M, W, Th, F 8am or 1:30pm, Tues 9a or 1:30 - first come/first served. °Services: °Simple extractions, temporary fillings, uncomplicated abscess drainage.  You do not need to be an [place] resident. °Payment Options: °PAYMENT IS DUE AT THE TIME OF SERVICE. °Dental insurance, otherwise sliding scale - bring proof of income or support. °Depending on income and treatment needed, cost is usually $50-200. °Best way to get seen: °Arrive early as it is first come/first served. °  °  °[facility] °[phone] °  °Location: °[address] °Clinic Hours: °Mon-Thu 8a-5p °Services: °Most basic dental services including extractions and fillings. °Payment Options: °PAYMENT IS DUE AT THE TIME OF SERVICE. °Sliding scale, up to 50% off - bring proof if income or support. °Medicaid with dental option accepted. °Best way to get seen: °Call to schedule an appointment, can usually be seen within 2 weeks OR they will try to see walk-ins - show up at 8a or 2p (you may have to wait). °  °  °[facility] °919-245-2435 °[place] RESIDENTS ONLY °  °Location: °[facility], [address], [place], [place] [address] °Clinic Hours: By appointment only. °Monday - Thursday 8am-5pm, Friday 8am-12pm °Services: Cleanings, fillings, extractions. °Payment Options: °PAYMENT IS DUE AT THE TIME OF SERVICE. °Cash, Visa or MasterCard. Sliding scale - $30 minimum per service. °Best way to get seen: °Come in to office, complete packet and make an appointment - need proof of income °or support monies for each household member and proof of [place] residence. °Usually takes about a month to get in. °  °  °[facility] °[phone] °  °Location: °[address] [place] °Clinic Hours: Walk-in Urgent Care  Dental Services are offered Monday-Friday mornings only. °The numbers of emergencies accepted daily is limited to the number of °providers available. °Maximum 15 - Mondays, Wednesdays & Thursdays °Maximum 10 - Tuesdays & Fridays °Services: °You do not need to be a [place] resident to be seen for a dental emergency. °Emergencies are defined as pain, swelling, abnormal bleeding, or dental trauma. Walkins will receive x-rays if needed. °NOTE: Dental cleaning is not an emergency. °Payment Options: °PAYMENT IS DUE AT THE TIME OF SERVICE. °Minimum co-pay is $40.00 for uninsured patients. °Minimum co-pay is $3.00 for Medicaid with dental coverage. °Dental Insurance is accepted and must be presented at time of visit. °Medicare does not cover dental. °Forms of payment: Cash, credit card, checks. °Best way to get seen: °If not previously registered with the clinic, walk-in dental registration begins at 7:15 am and is on a first come/first serve basis. °If previously registered with the clinic, call to make an appointment. °  °  °[facility] °[phone] °[place] RESIDENTS ONLY °  °Location: °[address], [place], [place] °Clinic Hours: °Mon-Thu 10a-2p °Services: Extractions only! °Payment Options: °FREE (donations accepted) - bring proof of income or support °Best way to get seen: °Call and schedule an appointment OR come at 8am on the 1st Monday of every month (except for holidays) when it is first come/first served. °  °  °[facility] °[phone] °  °Location: °[address], [place] °Clinic Hours: °Friday mornings °Services, Payment Options, Best way to get seen: °Call for info ° °

## 2016-01-28 ENCOUNTER — Emergency Department
Admission: EM | Admit: 2016-01-28 | Discharge: 2016-01-28 | Disposition: A | Discharge status: Home / Self Care | Payer: Self-pay | Attending: Emergency Medicine | Admitting: Emergency Medicine

## 2016-01-28 ENCOUNTER — Encounter: Payer: Self-pay | Admitting: *Deleted

## 2016-01-28 DIAGNOSIS — K029 Dental caries, unspecified: Secondary | ICD-10-CM | POA: Insufficient documentation

## 2016-01-28 DIAGNOSIS — K0889 Other specified disorders of teeth and supporting structures: Secondary | ICD-10-CM | POA: Insufficient documentation

## 2016-01-28 DIAGNOSIS — K0381 Cracked tooth: Secondary | ICD-10-CM | POA: Insufficient documentation

## 2016-01-28 DIAGNOSIS — F17210 Nicotine dependence, cigarettes, uncomplicated: Secondary | ICD-10-CM | POA: Insufficient documentation

## 2016-01-28 MED ORDER — AMOXICILLIN 500 MG PO TABS: 500.0000 mg | ORAL_TABLET | Freq: Two times a day (BID) | ORAL | Status: DC

## 2016-01-28 MED ORDER — OXYCODONE-ACETAMINOPHEN 5-325 MG PO TABS: 1.0000 | ORAL_TABLET | ORAL | Status: DC | PRN

## 2016-01-28 NOTE — ED Notes (Signed)
Pt has left lower toothache since last night. States my tooth broke off.

## 2016-01-28 NOTE — ED Provider Notes (Signed)
[facility] Emergency Department Provider Note  ____________________________________________  Time seen: Approximately 8:32 PM  I have reviewed the triage vital signs and the nursing notes.   HISTORY  Chief Complaint Dental Pain    HPI Paul Day is a 60 y.o. male who presents with severe dental pain and fractured teeth on the lower gums. In addition he complains that his gums appear to be swollen tender difficulty eating. Chronic in nature paced reports not having any insurance or money to see the dentist at this time. No relief with over-the-counter medication and describes pain as 10 over 10 at this time   Past Medical History  Diagnosis Date  . Rheumatoid arthritis(714.0)     Patient Active Problem List   Diagnosis Date Noted  . Rheumatoid arthritis(714.0)     Past Surgical History  Procedure Laterality Date  . Leg surgery Right     Current Outpatient Rx  Name  Route  Sig  Dispense  Refill  . amoxicillin (AMOXIL) 500 MG tablet   Oral   Take 1 tablet (500 mg total) by mouth 2 (two) times daily.   20 tablet   0   . oxyCODONE-acetaminophen (ROXICET) 5-325 MG tablet   Oral   Take 1-2 tablets by mouth every 4 (four) hours as needed for severe pain.   15 tablet   0     Allergies Review of patient's allergies indicates no known allergies.  No family history on file.  Social History Social History  Substance Use Topics  . Smoking status: Current Every Day Smoker -- 1.00 packs/day    Types: Cigarettes  . Smokeless tobacco: Never Used  . Alcohol Use: No    Review of Systems Constitutional: No fever/chills Eyes: No visual changes. ENT: Positive for dental pain. Cardiovascular: Denies chest pain. Respiratory: Denies shortness of breath. Musculoskeletal: Negative for back pain. Skin: Negative for rash. Neurological: Negative for headaches, focal weakness or numbness.  10-point ROS otherwise  negative.  ____________________________________________   PHYSICAL EXAM:  VITAL SIGNS: ED Triage Vitals  Enc Vitals Group     BP 01/28/16 1957 112/66 mmHg     Pulse Rate 01/28/16 1957 110     Resp 01/28/16 1957 20     Temp 01/28/16 1957 98.3 F (36.8 C)     Temp Source 01/28/16 1957 Oral     SpO2 01/28/16 1957 97 %     Weight 01/28/16 1957 130 lb (58.968 kg)     Height 01/28/16 1957 5\' 6"  (1.676 m)     Head Cir --      Peak Flow --      Pain Score 01/28/16 1958 8     Pain Loc --      Pain Edu? --      Excl. in GC? --     Constitutional: Alert and oriented. Well appearing and in no acute distress. Mouth/Throat: Mucous membranes are moist.  Lower gums are erythematous. Teeth are warm to the gums and severely fractured throughout with multiple dental caries noted within the mouth itself. Neck: No stridor.   Skin:  Skin is warm, dry and intact. No rash noted. Psychiatric: Mood and affect are normal. Speech and behavior are normal.  ____________________________________________   LABS (all labs ordered are listed, but only abnormal results are displayed)  Labs Reviewed - No data to display   PROCEDURES  Procedure(s) performed: None  Critical Care performed: No  ____________________________________________   INITIAL IMPRESSION / ASSESSMENT AND PLAN /  ED COURSE  Pertinent labs & imaging results that were available during my care of the patient were reviewed by me and considered in my medical decision making (see chart for details).  Dental caries, chronic in nature with some lower gum erythema. Rx given for amoxicillin 500 mg 3 times a day and Percocet 5/325. A list of local dental providers was provided to the patient is encouraged to follow up with him as soon as possible. ____________________________________________   FINAL CLINICAL IMPRESSION(S) / ED DIAGNOSES  Final diagnoses:  Pain, dental     This chart was dictated using voice recognition  software/Dragon. Despite best efforts to proofread, errors can occur which can change the meaning. Any change was purely unintentional.   Evangeline Dakin, PA-C 01/28/16 2124  Emily Filbert, MD 01/28/16 2219

## 2016-02-05 ENCOUNTER — Emergency Department: Payer: Self-pay

## 2016-02-05 ENCOUNTER — Encounter: Payer: Self-pay | Admitting: Emergency Medicine

## 2016-02-05 ENCOUNTER — Emergency Department
Admission: EM | Admit: 2016-02-05 | Discharge: 2016-02-05 | Disposition: A | Discharge status: Home / Self Care | Payer: Self-pay | Attending: Emergency Medicine | Admitting: Emergency Medicine

## 2016-02-05 DIAGNOSIS — T148 Other injury of unspecified body region: Secondary | ICD-10-CM | POA: Insufficient documentation

## 2016-02-05 DIAGNOSIS — Y998 Other external cause status: Secondary | ICD-10-CM | POA: Insufficient documentation

## 2016-02-05 DIAGNOSIS — F1410 Cocaine abuse, uncomplicated: Secondary | ICD-10-CM | POA: Insufficient documentation

## 2016-02-05 DIAGNOSIS — F1910 Other psychoactive substance abuse, uncomplicated: Secondary | ICD-10-CM

## 2016-02-05 DIAGNOSIS — Y9289 Other specified places as the place of occurrence of the external cause: Secondary | ICD-10-CM | POA: Insufficient documentation

## 2016-02-05 DIAGNOSIS — S8990XA Unspecified injury of unspecified lower leg, initial encounter: Secondary | ICD-10-CM | POA: Insufficient documentation

## 2016-02-05 DIAGNOSIS — Y9389 Activity, other specified: Secondary | ICD-10-CM | POA: Insufficient documentation

## 2016-02-05 DIAGNOSIS — R079 Chest pain, unspecified: Secondary | ICD-10-CM

## 2016-02-05 DIAGNOSIS — S29001A Unspecified injury of muscle and tendon of front wall of thorax, initial encounter: Secondary | ICD-10-CM | POA: Insufficient documentation

## 2016-02-05 DIAGNOSIS — W2209XA Striking against other stationary object, initial encounter: Secondary | ICD-10-CM | POA: Insufficient documentation

## 2016-02-05 DIAGNOSIS — F1210 Cannabis abuse, uncomplicated: Secondary | ICD-10-CM | POA: Insufficient documentation

## 2016-02-05 DIAGNOSIS — S299XXA Unspecified injury of thorax, initial encounter: Secondary | ICD-10-CM | POA: Insufficient documentation

## 2016-02-05 DIAGNOSIS — F1110 Opioid abuse, uncomplicated: Secondary | ICD-10-CM | POA: Insufficient documentation

## 2016-02-05 DIAGNOSIS — T148XXA Other injury of unspecified body region, initial encounter: Secondary | ICD-10-CM

## 2016-02-05 DIAGNOSIS — R000 Tachycardia, unspecified: Secondary | ICD-10-CM | POA: Insufficient documentation

## 2016-02-05 DIAGNOSIS — F17210 Nicotine dependence, cigarettes, uncomplicated: Secondary | ICD-10-CM | POA: Insufficient documentation

## 2016-02-05 LAB — COMPREHENSIVE METABOLIC PANEL
ALT: 35 U/L (ref 17–63)
AST: 40 U/L (ref 15–41)
Albumin: 4.1 g/dL (ref 3.5–5.0)
Alkaline Phosphatase: 322 U/L — ABNORMAL HIGH (ref 38–126)
Anion gap: 13 (ref 5–15)
BILIRUBIN TOTAL: 0.3 mg/dL (ref 0.3–1.2)
BUN: 18 mg/dL (ref 6–20)
CO2: 25 mmol/L (ref 22–32)
CREATININE: 0.62 mg/dL (ref 0.61–1.24)
Calcium: 10.1 mg/dL (ref 8.9–10.3)
Chloride: 100 mmol/L — ABNORMAL LOW (ref 101–111)
GFR calc Af Amer: >60 mL/min (ref >60)
Glucose, Bld: 134 mg/dL — ABNORMAL HIGH (ref 65–99)
POTASSIUM: 3.5 mmol/L (ref 3.5–5.1)
Sodium: 138 mmol/L (ref 135–145)
TOTAL PROTEIN: 9.5 g/dL — AB (ref 6.5–8.1)

## 2016-02-05 LAB — URINE DRUG SCREEN, QUALITATIVE (ARMC ONLY)
Amphetamines, Ur Screen: NOT DETECTED
BARBITURATES, UR SCREEN: NOT DETECTED
BENZODIAZEPINE, UR SCRN: NOT DETECTED
CANNABINOID 50 NG, UR ~~LOC~~: POSITIVE — AB
Cocaine Metabolite,Ur ~~LOC~~: POSITIVE — AB
MDMA (Ecstasy)Ur Screen: NOT DETECTED
METHADONE SCREEN, URINE: NOT DETECTED
OPIATE, UR SCREEN: POSITIVE — AB
PHENCYCLIDINE (PCP) UR S: NOT DETECTED
Tricyclic, Ur Screen: NOT DETECTED

## 2016-02-05 LAB — CBC
HCT: 45.9 % (ref 40.0–52.0)
Hemoglobin: 15.3 g/dL (ref 13.0–18.0)
MCH: 25.6 pg — AB (ref 26.0–34.0)
MCHC: 33.3 g/dL (ref 32.0–36.0)
MCV: 76.7 fL — AB (ref 80.0–100.0)
PLATELETS: 389 10*3/uL (ref 150–440)
RBC: 5.98 MIL/uL — AB (ref 4.40–5.90)
RDW: 13.5 % (ref 11.5–14.5)
WBC: 7.8 10*3/uL (ref 3.8–10.6)

## 2016-02-05 LAB — TROPONIN I: Troponin I: <0.03 ng/mL (ref <0.031)

## 2016-02-05 LAB — LACTIC ACID, PLASMA
LACTIC ACID, VENOUS: 2.4 mmol/L — AB (ref 0.5–2.0)
Lactic Acid, Venous: 1.3 mmol/L (ref 0.5–2.0)

## 2016-02-05 LAB — ETHANOL: Alcohol, Ethyl (B): <5 mg/dL (ref <5)

## 2016-02-05 MED ORDER — SODIUM CHLORIDE 0.9 % IV SOLN
Freq: Once | INTRAVENOUS | Status: AC
  Administered 2016-02-05: 10:00:00 via INTRAVENOUS

## 2016-02-05 MED ORDER — IBUPROFEN 600 MG PO TABS: 600.0000 mg | ORAL_TABLET | Freq: Three times a day (TID) | ORAL | Status: DC | PRN

## 2016-02-05 MED ORDER — MORPHINE SULFATE (PF) 4 MG/ML IV SOLN
4.0000 mg | Freq: Once | INTRAVENOUS | Status: AC
  Administered 2016-02-05: 4 mg via INTRAVENOUS
  Filled 2016-02-05: qty 1

## 2016-02-05 MED ORDER — CYCLOBENZAPRINE HCL 10 MG PO TABS: 10.0000 mg | ORAL_TABLET | Freq: Three times a day (TID) | ORAL | Status: DC | PRN

## 2016-02-05 MED ORDER — LEVOFLOXACIN 500 MG PO TABS: 500.0000 mg | ORAL_TABLET | Freq: Every day | ORAL | Status: DC

## 2016-02-05 MED ORDER — DIAZEPAM 5 MG/ML IJ SOLN
5.0000 mg | Freq: Once | INTRAMUSCULAR | Status: AC
  Administered 2016-02-05: 5 mg via INTRAVENOUS
  Filled 2016-02-05: qty 2

## 2016-02-05 MED ORDER — IOHEXOL 300 MG/ML  SOLN
75.0000 mL | Freq: Once | INTRAMUSCULAR | Status: AC | PRN
  Administered 2016-02-05: 75 mL via INTRAVENOUS
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
## 2016-02-05 NOTE — ED Notes (Signed)
PT WAS GIVEN WARM BLANKET

## 2016-02-05 NOTE — ED Notes (Signed)
Patient transported to CT 

## 2016-02-05 NOTE — ED Notes (Signed)
Patient called out, went in to check on patient.  Family states that patient cant sleep.  Informed patient and family that it is mid afternoon.  When asked what else they needed, the denied further needs.  No acute distress noted.
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
## 2016-02-05 NOTE — ED Notes (Signed)
1015 first set blood cx drawn, 1017 second set blood cx drawn

## 2016-02-05 NOTE — Discharge Instructions (Signed)
Nonspecific Chest Pain  °Chest pain can be caused by many different conditions. There is always a chance that your pain could be related to something serious, such as a heart attack or a blood clot in your lungs. Chest pain can also be caused by conditions that are not life-threatening. If you have chest pain, it is very important to follow up with your health care provider. °CAUSES  °Chest pain can be caused by: °· Heartburn. °· Pneumonia or bronchitis. °· Anxiety or stress. °· Inflammation around your heart (pericarditis) or lung (pleuritis or pleurisy). °· A blood clot in your lung. °· A collapsed lung (pneumothorax). It can develop suddenly on its own (spontaneous pneumothorax) or from trauma to the chest. °· Shingles infection (varicella-zoster virus). °· Heart attack. °· Damage to the bones, muscles, and cartilage that make up your chest wall. This can include: °¨ Bruised bones due to injury. °¨ Strained muscles or cartilage due to frequent or repeated coughing or overwork. °¨ Fracture to one or more ribs. °¨ Sore cartilage due to inflammation (costochondritis). °RISK FACTORS  °Risk factors for chest pain may include: °· Activities that increase your risk for trauma or injury to your chest. °· Respiratory infections or conditions that cause frequent coughing. °· Medical conditions or overeating that can cause heartburn. °· Heart disease or family history of heart disease. °· Conditions or health behaviors that increase your risk of developing a blood clot. °· Having had chicken pox (varicella zoster). °SIGNS AND SYMPTOMS °Chest pain can feel like: °· Burning or tingling on the surface of your chest or deep in your chest. °· Crushing, pressure, aching, or squeezing pain. °· Dull or sharp pain that is worse when you move, cough, or take a deep breath. °· Pain that is also felt in your back, neck, shoulder, or arm, or pain that spreads to any of these areas. °Your chest pain may come and go, or it may stay  constant. °DIAGNOSIS °Lab tests or other studies may be needed to find the cause of your pain. Your health care provider may have you take a test called an ambulatory ECG (electrocardiogram). An ECG records your heartbeat patterns at the time the test is performed. You may also have other tests, such as: °· Transthoracic echocardiogram (TTE). During echocardiography, sound waves are used to create a picture of all of the heart structures and to look at how blood flows through your heart. °· Transesophageal echocardiogram (TEE). This is a more advanced imaging test that obtains images from inside your body. It allows your health care provider to see your heart in finer detail. °· Cardiac monitoring. This allows your health care provider to monitor your heart rate and rhythm in real time. °· Holter monitor. This is a portable device that records your heartbeat and can help to diagnose abnormal heartbeats. It allows your health care provider to track your heart activity for several days, if needed. °· Stress tests. These can be done through exercise or by taking medicine that makes your heart beat more quickly. °· Blood tests. °· Imaging tests. °TREATMENT  °Your treatment depends on what is causing your chest pain. Treatment may include: °· Medicines. These may include: °¨ Acid blockers for heartburn. °¨ Anti-inflammatory medicine. °¨ Pain medicine for inflammatory conditions. °¨ Antibiotic medicine, if an infection is present. °¨ Medicines to dissolve blood clots. °¨ Medicines to treat coronary artery disease. °· Supportive care for conditions that do not require medicines. This may include: °¨ Resting. °¨ Applying heat   or cold packs to injured areas. °¨ Limiting activities until pain decreases. °HOME CARE INSTRUCTIONS °· If you were prescribed an antibiotic medicine, finish it all even if you start to feel better. °· Avoid any activities that bring on chest pain. °· Do not use any tobacco products, including  cigarettes, chewing tobacco, or electronic cigarettes. If you need help quitting, ask your health care provider. °· Do not drink alcohol. °· Take medicines only as directed by your health care provider. °· Keep all follow-up visits as directed by your health care provider. This is important. This includes any further testing if your chest pain does not go away. °· If heartburn is the cause for your chest pain, you may be told to keep your head raised (elevated) while sleeping. This reduces the chance that acid will go from your stomach into your esophagus. °· Make lifestyle changes as directed by your health care provider. These may include: °¨ Getting regular exercise. Ask your health care provider to suggest some activities that are safe for you. °¨ Eating a heart-healthy diet. A registered dietitian can help you to learn healthy eating options. °¨ Maintaining a healthy weight. °¨ Managing diabetes, if necessary. °¨ Reducing stress. °SEEK MEDICAL CARE IF: °· Your chest pain does not go away after treatment. °· You have a rash with blisters on your chest. °· You have a fever. °SEEK IMMEDIATE MEDICAL CARE IF:  °· Your chest pain is worse. °· You have an increasing cough, or you cough up blood. °· You have severe abdominal pain. °· You have severe weakness. °· You faint. °· You have chills. °· You have sudden, unexplained chest discomfort. °· You have sudden, unexplained discomfort in your arms, back, neck, or jaw. °· You have shortness of breath at any time. °· You suddenly start to sweat, or your skin gets clammy. °· You feel nauseous or you vomit. °· You suddenly feel light-headed or dizzy. °· Your heart begins to beat quickly, or it feels like it is skipping beats. °These symptoms may represent a serious problem that is an emergency. Do not wait to see if the symptoms will go away. Get medical help right away. Call your local emergency services (911 in the [place] Do not drive yourself to the hospital. °  °This  information is not intended to replace advice given to you by your health care provider. Make sure you discuss any questions you have with your health care provider. °  °Document Released: 08/27/2005 Document Revised: 12/08/2014 Document Reviewed: 06/23/2014 °Elsevier Interactive Patient Education ©2016 [employer] °Pulmonary Nodule °A pulmonary nodule is a small, round growth of tissue in the lung. Pulmonary nodules can range in size from less than 1/5 inch (4 mm) to a little bigger than an inch (25 mm). Most pulmonary nodules are detected when imaging tests of the lung are being performed for a different problem. Pulmonary nodules are usually not cancerous (benign). However, some pulmonary nodules are cancerous (malignant). Follow-up treatment or testing is based on the size of the pulmonary nodule and your risk of getting lung cancer.  °CAUSES °Benign pulmonary nodules can be caused by various things. Some of the causes include:  °· Bacterial, fungal, or viral infections. This is usually an old infection that is no longer active, but it can sometimes be a current, active infection. °· A benign mass of tissue. °· Inflammation from conditions such as rheumatoid arthritis.   °· Abnormal blood vessels in the lungs. °Malignant pulmonary nodules can result from   lung cancer or from cancers that spread to the lung from other places in the body. °SIGNS AND SYMPTOMS °Pulmonary nodules usually do not cause symptoms. °DIAGNOSIS °Most often, pulmonary nodules are found incidentally when an X-ray or CT scan is performed to look for some other problem in the lung area. To help determine whether a pulmonary nodule is benign or malignant, your health care provider will take a medical history and order a variety of tests. Tests done may include:  °· Blood tests. °· A skin test called a tuberculin test. This test is used to determine if you have been exposed to the germ that causes tuberculosis.   °· Chest X-rays. If possible, a  new X-ray may be compared with X-rays you have had in the past.    °· CT scan. This test shows smaller pulmonary nodules more clearly than an X-ray.   °· Positron emission tomography (PET) scan. In this test, a safe amount of a radioactive substance is injected into the bloodstream. Then, the scan takes a picture of the pulmonary nodule. The radioactive substance is eliminated from your body in your urine.   °· Biopsy. A tiny piece of the pulmonary nodule is removed so it can be checked under a microscope. °TREATMENT  °Pulmonary nodules that are benign normally do not require any treatment because they usually do not cause symptoms or breathing problems. Your health care provider may want to monitor the pulmonary nodule through follow-up CT scans. The frequency of these CT scans will vary based on the size of the nodule and the risk factors for lung cancer. For example, CT scans will need to be done more frequently if the pulmonary nodule is larger and if you have a history of smoking and a family history of cancer. Further testing or biopsies may be done if any follow-up CT scan shows that the size of the pulmonary nodule has increased. °HOME CARE INSTRUCTIONS °· Only take over-the-counter or prescription medicines as directed by your health care provider. °· Keep all follow-up appointments with your health care provider. °SEEK MEDICAL CARE IF: °· You have trouble breathing when you are active.   °· You feel sick or unusually tired.   °· You do not feel like eating.   °· You lose weight without trying to.   °· You develop chills or night sweats.   °SEEK IMMEDIATE MEDICAL CARE IF: °· You cannot catch your breath, or you begin wheezing.   °· You cannot stop coughing.   °· You cough up blood.   °· You become dizzy or feel like you are going to pass out.   °· You have sudden chest pain.   °· You have a fever or persistent symptoms for more than 2-3 days.   °· You have a fever and your symptoms suddenly get worse. °MAKE  SURE YOU: °· Understand these instructions. °· Will watch your condition. °· Will get help right away if you are not doing well or get worse. °  °This information is not intended to replace advice given to you by your health care provider. Make sure you discuss any questions you have with your health care provider. °  °Document Released: 09/14/2009 Document Revised: 07/20/2013 Document Reviewed: 05/09/2013 °Elsevier Interactive Patient Education ©2016 [employer] ° °

## 2016-02-10 LAB — CULTURE, BLOOD (ROUTINE X 2)
CULTURE: NO GROWTH
Culture: NO GROWTH

## 2016-02-11 ENCOUNTER — Encounter: Payer: Self-pay | Admitting: Emergency Medicine

## 2016-02-11 ENCOUNTER — Emergency Department: Payer: Self-pay

## 2016-02-11 ENCOUNTER — Inpatient Hospital Stay
Admission: EM | Admit: 2016-02-11 | Discharge: 2016-02-13 | DRG: 194 | Disposition: A | Discharge status: Home / Self Care | Payer: Self-pay | Attending: Internal Medicine | Admitting: Internal Medicine

## 2016-02-11 DIAGNOSIS — M069 Rheumatoid arthritis, unspecified: Secondary | ICD-10-CM | POA: Diagnosis present

## 2016-02-11 DIAGNOSIS — J189 Pneumonia, unspecified organism: Principal | ICD-10-CM | POA: Diagnosis present

## 2016-02-11 DIAGNOSIS — R6520 Severe sepsis without septic shock: Secondary | ICD-10-CM | POA: Diagnosis present

## 2016-02-11 DIAGNOSIS — Z9889 Other specified postprocedural states: Secondary | ICD-10-CM

## 2016-02-11 DIAGNOSIS — E876 Hypokalemia: Secondary | ICD-10-CM | POA: Diagnosis present

## 2016-02-11 DIAGNOSIS — Z79899 Other long term (current) drug therapy: Secondary | ICD-10-CM

## 2016-02-11 DIAGNOSIS — I249 Acute ischemic heart disease, unspecified: Secondary | ICD-10-CM

## 2016-02-11 DIAGNOSIS — I248 Other forms of acute ischemic heart disease: Secondary | ICD-10-CM | POA: Diagnosis present

## 2016-02-11 DIAGNOSIS — F419 Anxiety disorder, unspecified: Secondary | ICD-10-CM | POA: Diagnosis present

## 2016-02-11 DIAGNOSIS — F1490 Cocaine use, unspecified, uncomplicated: Secondary | ICD-10-CM | POA: Diagnosis present

## 2016-02-11 DIAGNOSIS — R000 Tachycardia, unspecified: Secondary | ICD-10-CM | POA: Diagnosis present

## 2016-02-11 DIAGNOSIS — R0682 Tachypnea, not elsewhere classified: Secondary | ICD-10-CM | POA: Diagnosis present

## 2016-02-11 DIAGNOSIS — E0590 Thyrotoxicosis, unspecified without thyrotoxic crisis or storm: Secondary | ICD-10-CM | POA: Diagnosis present

## 2016-02-11 DIAGNOSIS — I200 Unstable angina: Secondary | ICD-10-CM | POA: Diagnosis present

## 2016-02-11 DIAGNOSIS — F17210 Nicotine dependence, cigarettes, uncomplicated: Secondary | ICD-10-CM | POA: Diagnosis present

## 2016-02-11 DIAGNOSIS — A419 Sepsis, unspecified organism: Secondary | ICD-10-CM | POA: Diagnosis present

## 2016-02-11 LAB — CBC
HCT: 40.7 % (ref 40.0–52.0)
HEMATOCRIT: 45.8 % (ref 40.0–52.0)
HEMOGLOBIN: 15.3 g/dL (ref 13.0–18.0)
HEMOGLOBIN: 13.5 g/dL (ref 13.0–18.0)
MCH: 25.8 pg — AB (ref 26.0–34.0)
MCH: 25.8 pg — AB (ref 26.0–34.0)
MCHC: 33.5 g/dL (ref 32.0–36.0)
MCHC: 33.1 g/dL (ref 32.0–36.0)
MCV: 77.1 fL — AB (ref 80.0–100.0)
MCV: 77.8 fL — ABNORMAL LOW (ref 80.0–100.0)
Platelets: 307 10*3/uL (ref 150–440)
Platelets: 242 10*3/uL (ref 150–440)
RBC: 5.95 MIL/uL — AB (ref 4.40–5.90)
RBC: 5.23 MIL/uL (ref 4.40–5.90)
RDW: 13.8 % (ref 11.5–14.5)
RDW: 13.8 % (ref 11.5–14.5)
WBC: 7.5 10*3/uL (ref 3.8–10.6)
WBC: 6.3 10*3/uL (ref 3.8–10.6)

## 2016-02-11 LAB — CREATININE, SERUM
CREATININE: 0.55 mg/dL — AB (ref 0.61–1.24)
GFR calc Af Amer: >60 mL/min (ref >60)
GFR calc non Af Amer: >60 mL/min (ref >60)

## 2016-02-11 LAB — COMPREHENSIVE METABOLIC PANEL
ALT: 41 U/L (ref 17–63)
ANION GAP: 12 (ref 5–15)
AST: 48 U/L — ABNORMAL HIGH (ref 15–41)
Albumin: 4 g/dL (ref 3.5–5.0)
Alkaline Phosphatase: 260 U/L — ABNORMAL HIGH (ref 38–126)
BUN: 19 mg/dL (ref 6–20)
CHLORIDE: 94 mmol/L — AB (ref 101–111)
CO2: 31 mmol/L (ref 22–32)
CREATININE: 0.86 mg/dL (ref 0.61–1.24)
Calcium: 10.1 mg/dL (ref 8.9–10.3)
GFR calc non Af Amer: >60 mL/min (ref >60)
Glucose, Bld: 177 mg/dL — ABNORMAL HIGH (ref 65–99)
POTASSIUM: 3.6 mmol/L (ref 3.5–5.1)
SODIUM: 137 mmol/L (ref 135–145)
Total Bilirubin: 0.6 mg/dL (ref 0.3–1.2)
Total Protein: 8.9 g/dL — ABNORMAL HIGH (ref 6.5–8.1)

## 2016-02-11 LAB — TROPONIN I
Troponin I: 0.07 ng/mL — ABNORMAL HIGH (ref <0.031)
Troponin I: 0.1 ng/mL — ABNORMAL HIGH (ref <0.031)
Troponin I: 0.04 ng/mL — ABNORMAL HIGH (ref <0.031)

## 2016-02-11 LAB — DIFFERENTIAL
BASOS ABS: 0 10*3/uL (ref 0–0.1)
Basophils Relative: 0 %
Eosinophils Absolute: 0 10*3/uL (ref 0–0.7)
Eosinophils Relative: 0 %
LYMPHS PCT: 16 %
Lymphs Abs: 1.3 10*3/uL (ref 1.0–3.6)
Monocytes Absolute: 0.4 10*3/uL (ref 0.2–1.0)
Monocytes Relative: 6 %
NEUTROS ABS: 6.1 10*3/uL (ref 1.4–6.5)
NEUTROS PCT: 78 %

## 2016-02-11 LAB — ETHANOL: Alcohol, Ethyl (B): <5 mg/dL (ref <5)

## 2016-02-11 LAB — LACTIC ACID, PLASMA: LACTIC ACID, VENOUS: 1.3 mmol/L (ref 0.5–2.0)

## 2016-02-11 LAB — TSH: TSH: 0.2 u[IU]/mL — AB (ref 0.350–4.500)

## 2016-02-11 LAB — LIPASE, BLOOD: LIPASE: 30 U/L (ref 11–51)

## 2016-02-11 MED ORDER — ACETAMINOPHEN 650 MG RE SUPP: 650.0000 mg | Freq: Four times a day (QID) | RECTAL | Status: DC | PRN

## 2016-02-11 MED ORDER — PIPERACILLIN-TAZOBACTAM 3.375 G IVPB
3.3750 g | Freq: Three times a day (TID) | INTRAVENOUS | Status: DC
  Administered 2016-02-11 – 2016-02-13 (×5): 3.375 g via INTRAVENOUS
  Filled 2016-02-11 (×7): qty 50

## 2016-02-11 MED ORDER — ONDANSETRON HCL 4 MG PO TABS: 4.0000 mg | ORAL_TABLET | Freq: Four times a day (QID) | ORAL | Status: DC | PRN

## 2016-02-11 MED ORDER — LORAZEPAM 2 MG/ML IJ SOLN
1.0000 mg | Freq: Once | INTRAMUSCULAR | Status: AC
  Administered 2016-02-11: 1 mg via INTRAVENOUS
  Filled 2016-02-11: qty 1

## 2016-02-11 MED ORDER — ACETAMINOPHEN 325 MG PO TABS: 650.0000 mg | ORAL_TABLET | Freq: Four times a day (QID) | ORAL | Status: DC | PRN

## 2016-02-11 MED ORDER — SODIUM CHLORIDE 0.9 % IV BOLUS (SEPSIS)
1000.0000 mL | Freq: Once | INTRAVENOUS | Status: AC
Start: 2016-02-11 — End: 2016-02-11
  Administered 2016-02-11: 1000 mL via INTRAVENOUS

## 2016-02-11 MED ORDER — ASPIRIN EC 325 MG PO TBEC
325.0000 mg | DELAYED_RELEASE_TABLET | Freq: Once | ORAL | Status: DC
  Filled 2016-02-11: qty 1

## 2016-02-11 MED ORDER — ONDANSETRON HCL 4 MG/2ML IJ SOLN
4.0000 mg | Freq: Once | INTRAMUSCULAR | Status: AC
  Administered 2016-02-11: 4 mg via INTRAVENOUS
  Filled 2016-02-11: qty 2

## 2016-02-11 MED ORDER — NITROGLYCERIN 0.4 MG SL SUBL: 0.4000 mg | SUBLINGUAL_TABLET | SUBLINGUAL | Status: DC | PRN

## 2016-02-11 MED ORDER — GI COCKTAIL ~~LOC~~
30.0000 mL | Freq: Once | ORAL | Status: AC
  Administered 2016-02-11: 30 mL via ORAL
  Filled 2016-02-11: qty 30

## 2016-02-11 MED ORDER — DILTIAZEM HCL 25 MG/5ML IV SOLN
5.0000 mg | Freq: Once | INTRAVENOUS | Status: AC
  Administered 2016-02-11: 5 mg via INTRAVENOUS
  Filled 2016-02-11: qty 5

## 2016-02-11 MED ORDER — ENOXAPARIN SODIUM 40 MG/0.4ML ~~LOC~~ SOLN: 40.0000 mg | SUBCUTANEOUS | Status: DC

## 2016-02-11 MED ORDER — SODIUM CHLORIDE 0.9% FLUSH
3.0000 mL | Freq: Two times a day (BID) | INTRAVENOUS | Status: DC
  Administered 2016-02-11: 3 mL via INTRAVENOUS

## 2016-02-11 MED ORDER — ONDANSETRON HCL 4 MG/2ML IJ SOLN
4.0000 mg | Freq: Four times a day (QID) | INTRAMUSCULAR | Status: DC | PRN
  Administered 2016-02-12: 4 mg via INTRAVENOUS
  Filled 2016-02-11: qty 2

## 2016-02-11 MED ORDER — SODIUM CHLORIDE 0.9 % IV BOLUS (SEPSIS)
1000.0000 mL | Freq: Once | INTRAVENOUS | Status: AC
  Administered 2016-02-11: 1000 mL via INTRAVENOUS

## 2016-02-11 MED ORDER — VANCOMYCIN HCL IN DEXTROSE 750-5 MG/150ML-% IV SOLN
750.0000 mg | Freq: Once | INTRAVENOUS | Status: AC
  Administered 2016-02-11: 750 mg via INTRAVENOUS
  Filled 2016-02-11: qty 150

## 2016-02-11 MED ORDER — VANCOMYCIN HCL IN DEXTROSE 750-5 MG/150ML-% IV SOLN
750.0000 mg | INTRAVENOUS | Status: DC
  Administered 2016-02-12 – 2016-02-13 (×2): 750 mg via INTRAVENOUS
  Filled 2016-02-11 (×4): qty 150

## 2016-02-11 NOTE — Progress Notes (Signed)
Patient arrived to 2A Room 253. Patient denies pain and all questions answered. Patient oriented to unit and Fall Safety Plan signed by patient's son due to patient's mental status. Skin assessment completed with Lexi RN and skin intact. Sleeping but will respond to voice, VSS, and NSR/ST on box #[id number]. Bed alarm on, side rails up x2, and family at bedside. Nursing staff will continue to monitor. Lamonte Richer, RN

## 2016-02-11 NOTE — ED Notes (Signed)
Pt's family members demand that patient discharged. MD notified.

## 2016-02-11 NOTE — ED Notes (Signed)
Pt asked to keep cardiac leads on but pt continues to remove leads.

## 2016-02-11 NOTE — ED Notes (Signed)
Pt sleeping, awakes to voice, pt denies any needs.

## 2016-02-11 NOTE — H&P (Addendum)
[facility] [place] [facility]   PATIENT NAME: Paul Day    MR#:  [id number]  DATE OF BIRTH:  [birth year]  DATE OF ADMISSION:  02/11/2016  PRIMARY CARE PHYSICIAN: Default, Provider, MD   REQUESTING/REFERRING PHYSICIAN: Huel Cote, MD  CHIEF COMPLAINT:   Chief Complaint  Patient presents with  . Nausea  . Emesis    HISTORY OF PRESENT ILLNESS:  Paul Day  is a 60 y.o. male who presents with epigastric pain with nausea and vomiting. He was recently evaluated here for chest discomfort. He was found on that visit to have potentially a right-sided pneumonia. He was sent out on oral Levaquin. He comes back today with nausea and vomiting and epigastric pain. Today lab evaluation shows an elevated troponin 0.07, and a second troponin higher at 0.10. His troponin was negative when he was evaluated here several days ago. Chest x-ray today shows persistent pneumonia. Patient is tachycardic and tachypneic with an elevated blood pressure. Of note, during his last visit here his urine tox was positive for cocaine, opiates, and cannabinoids. Hospitalists were called for further evaluation and admission for ACS, as well as possible sepsis secondary to pneumonia.  PAST MEDICAL HISTORY:   Past Medical History  Diagnosis Date  . Rheumatoid arthritis(714.0)     PAST SURGICAL HISTORY:   Past Surgical History  Procedure Laterality Date  . Leg surgery Right     SOCIAL HISTORY:   Social History  Substance Use Topics  . Smoking status: Current Every Day Smoker -- 1.00 packs/day    Types: Cigarettes  . Smokeless tobacco: Never Used  . Alcohol Use: No    FAMILY HISTORY:  No family history on file.  DRUG ALLERGIES:   Allergies  Allergen Reactions  . Norco [Hydrocodone-Acetaminophen] Itching and Rash    MEDICATIONS AT HOME:   Prior to Admission medications   Medication Sig Start Date End Date Taking? Authorizing Provider  cyclobenzaprine (FLEXERIL) 10 MG  tablet Take 1 tablet (10 mg total) by mouth every 8 (eight) hours as needed for muscle spasms. 02/05/16 02/04/17 Yes Emily Filbert, MD  ibuprofen (ADVIL,MOTRIN) 600 MG tablet Take 600 mg by mouth every 8 (eight) hours as needed for mild pain.   Yes Historical Provider, MD  levofloxacin (LEVAQUIN) 500 MG tablet Take 1 tablet (500 mg total) by mouth daily. 02/05/16 02/15/16 Yes Emily Filbert, MD  amoxicillin (AMOXIL) 500 MG tablet Take 1 tablet (500 mg total) by mouth 2 (two) times daily. Patient not taking: Reported on 02/11/2016 01/28/16   Charmayne Sheer Beers, PA-C  oxyCODONE-acetaminophen (ROXICET) 5-325 MG tablet Take 1-2 tablets by mouth every 4 (four) hours as needed for severe pain. Patient not taking: Reported on 02/11/2016 01/28/16   Evangeline Dakin, PA-C    REVIEW OF SYSTEMS:  Review of Systems  Unable to perform ROS: patient nonverbal    Patient responds only minimally to the question about ongoing chest pain, which he denies with a shake of his head. He otherwise refuses to speak. VITAL SIGNS:   Filed Vitals:   02/11/16 1930 02/11/16 1945 02/11/16 2000 02/11/16 2030  BP: 164/87  172/82 166/97  Pulse: 116 109 113 118  Temp:      TempSrc:      Resp: 25 26 24    Height:      Weight:      SpO2: 96% 97% 99% 98%   Wt Readings from Last 3 Encounters:  02/11/16 45.36 kg (100 lb)  02/05/16  44.453 kg (98 lb)  01/28/16 58.968 kg (130 lb)    PHYSICAL EXAMINATION:  Physical Exam  Vitals reviewed. Constitutional: He appears well-developed and well-nourished. No distress.  HENT:  Head: Normocephalic and atraumatic.  Mouth/Throat: Oropharynx is clear and moist.  Eyes: Conjunctivae and EOM are normal. Pupils are equal, round, and reactive to light. No scleral icterus.  Neck: Normal range of motion. Neck supple. No JVD present. No thyromegaly present.  Cardiovascular: Regular rhythm and intact distal pulses.  Exam reveals no gallop and no friction rub.   No murmur heard. Tachycardic   Respiratory: Effort normal. No respiratory distress. He has no wheezes. He has no rales.  Right lower lobe coarse sounds  GI: Soft. Bowel sounds are normal. He exhibits no distension. There is no tenderness.  Musculoskeletal: Normal range of motion. He exhibits no edema.  No arthritis, no gout  Lymphadenopathy:    He has no cervical adenopathy.  Neurological: He is alert. No cranial nerve deficit.  Patient responds minimally to only a couple of questions with shaking of his head. He refuses to speak otherwise. Unable to fully assess for the same.   Skin: Skin is warm and dry. No rash noted. No erythema.  Psychiatric:  Unable to assess as patient refuses to speak    LABORATORY PANEL:   CBC  Recent Labs Lab 02/11/16 1404  WBC 7.5  HGB 15.3  HCT 45.8  PLT 307   ------------------------------------------------------------------------------------------------------------------  Chemistries   Recent Labs Lab 02/11/16 1404  NA 137  K 3.6  CL 94*  CO2 31  GLUCOSE 177*  BUN 19  CREATININE 0.86  CALCIUM 10.1  AST 48*  ALT 41  ALKPHOS 260*  BILITOT 0.6   ------------------------------------------------------------------------------------------------------------------  Cardiac Enzymes  Recent Labs Lab 02/11/16 1922  TROPONINI 0.10*   ------------------------------------------------------------------------------------------------------------------  RADIOLOGY:  Dg Abd Acute W/chest  02/11/2016  CLINICAL DATA:  3-4 day history of nausea, vomiting and abdominal pain. EXAM: DG ABDOMEN ACUTE W/ 1V CHEST COMPARISON:  Chest CT 02/05/2016 and chest x-ray 02/05/2016. FINDINGS: The upright chest x-ray demonstrates stable advanced emphysematous changes and pulmonary. Stable streaky right basilar airspace opacity likely resolving infiltrate. Artifact from an EKG lead noted over the left lower chest. Two views of the abdomen demonstrate an unremarkable bowel gas pattern except for  moderate stool throughout the colon and down into the rectum. No findings for small bowel obstruction or free air. The soft tissue shadows are maintained. The bony structures are intact. IMPRESSION: 1. Stable emphysematous changes and pulmonary scarring. 2. Persistent right basilar airspace opacity. 3. No acute abdominal findings. Moderate stool throughout the colon. Electronically Signed   By: Rudie Meyer M.D.   On: 02/11/2016 17:53    EKG:   Orders placed or performed during the hospital encounter of 02/11/16  . EKG 12-Lead  . EKG 12-Lead    IMPRESSION AND PLAN:  Principal Problem:   Severe sepsis (HCC) - tachycardic and tachypneic on admission, with elevated troponin as possible end organ damage, unclear if this is due to sepsis, ACS, or some combination of both. When he was seen here a few days ago he was sent out on Levaquin. His imaging today shows persistent right basilar opacity possibly pneumonia. We'll broaden his antibiotics. His blood pressure is okay, we will check a lactic acid. We will also send blood and urine cultures, and get a sputum culture if we can. Active Problems:   Unstable angina (HCC) - with elevated troponin. Urine tox a few  days ago was positive for cocaine. Question potentially cocaine induced ACS. We will avoid beta blockers. Use calcium channel blockers to lower his blood pressure and heart rate. We will trend his troponins serially. If it continues to rise we will anticoagulate him. We will get an echocardiogram and a cardiology consult.   CAP (community acquired pneumonia) - we'll broaden his antibiotics as above, get cultures as above.   Rheumatoid arthritis (HCC) - does not seem to be on any home medications for this, treat when necessary  All the records are reviewed and case discussed with ED provider. Management plans discussed with the patient and/or family.  DVT PROPHYLAXIS: SubQ lovenox  GI PROPHYLAXIS: None  ADMISSION STATUS: Inpatient  CODE  STATUS: Full, the patient refused to speak during interview. If he begins to speak, or if his family as able to be contacted, as they were not tonight, this question can be clarified. Code Status History    This patient does not have a recorded code status. Please follow your organizational policy for patients in this situation.      TOTAL TIME TAKING CARE OF THIS PATIENT: 40 minutes.    Cougar Imel FIELDING 02/11/2016, 8:59 PM  [facility]  Office  [phone]  CC: Primary care physician; Default, Provider, MD

## 2016-02-11 NOTE — ED Notes (Signed)
Pt reports N/V for 3 or 4 days. Pt also reports abdominal pain.

## 2016-02-11 NOTE — Progress Notes (Addendum)
ANTIBIOTIC CONSULT NOTE - INITIAL  Pharmacy Consult for Vancomycin/Zosyn  Indication: sepsis  Allergies  Allergen Reactions  . Norco [Hydrocodone-Acetaminophen] Itching and Rash    Patient Measurements: Height: 5\' 7"  (170.2 cm) Weight: 100 lb (45.36 kg) IBW/kg (Calculated) : 66.1 Adjusted Body Weight: 57.8 kg   Vital Signs: Temp: 98 F (36.7 C) (03/13 1401) Temp Source: Oral (03/13 1401) BP: 166/97 mmHg (03/13 2030) Pulse Rate: 118 (03/13 2030) Intake/Output from previous day:   Intake/Output from this shift:    Labs:  Recent Labs  02/11/16 1404  WBC 7.5  HGB 15.3  PLT 307  CREATININE 0.86   Estimated Creatinine Clearance: 58.7 mL/min (by C-G formula based on Cr of 0.86). No results for input(s): VANCOTROUGH, VANCOPEAK, VANCORANDOM, GENTTROUGH, GENTPEAK, GENTRANDOM, TOBRATROUGH, TOBRAPEAK, TOBRARND, AMIKACINPEAK, AMIKACINTROU, AMIKACIN in the last 72 hours.   Microbiology: Recent Results (from the past 720 hour(s))  Blood culture (routine x 2)     Status: None   Collection Time: 02/05/16 10:13 AM  Result Value Ref Range Status   Specimen Description BLOOD LEFT ARM  Final   Special Requests BOTTLES DRAWN AEROBIC AND ANAEROBIC 04/06/16  Final   Culture NO GROWTH 5 DAYS  Final   Report Status 02/10/2016 FINAL  Final  Blood culture (routine x 2)     Status: None   Collection Time: 02/05/16 10:17 AM  Result Value Ref Range Status   Specimen Description BLOOD RIGHT AC  Final   Special Requests BOTTLES DRAWN AEROBIC AND ANAEROBIC 0.5ML  Final   Culture NO GROWTH 5 DAYS  Final   Report Status 02/10/2016 FINAL  Final    Medical History: Past Medical History  Diagnosis Date  . Rheumatoid arthritis(714.0)     Medications:   (Not in a hospital admission) Assessment: CrCl = 58.7 ml/min Ke =0.053 hr-1 T1/2 = 13 hrs Vd = 31.8 L  Goal of Therapy:  Vancomycin trough level 15-20 mcg/ml  Plan:   Vancomycin 750 mg IV X 1 ordered for 3/13 @ 23:00. Vancomycin 750  mg IV Q18H ordered to start 3/14 @ 9:00, 10 hrs after 1st dose (stacked dosing). This pt will reach Css by 3/16 @ 23:00. Will draw 1st trough on 3/16 @ 14:30, which will be very close to Css.   Zosyn 3.375 gm IV Q8H EI ordered to start 3/13.   Manfred Laspina D 02/11/2016,9:24 PM

## 2016-02-11 NOTE — ED Notes (Signed)
Pt presents with n/v

## 2016-02-11 NOTE — ED Provider Notes (Signed)
Time Seen: Approximately ----------------------------------------- 4:16 PM on 02/11/2016 -----------------------------------------   I have reviewed the triage notes  Chief Complaint: Nausea and Emesis   History of Present Illness: Paul Day is a 60 y.o. male who is apparently was seen and evaluated here recently for chest discomfort. Patient had an extensive evaluation at that time which did not show any etiology. Patient has a long history of polysubstance abuse. He is very evasive about asking any questions about when his last cocaine ingestion was etc. He primarily complains on this visit of epigastric pain with nausea and vomiting. He denies any loose stool. Denies any hematemesis or biliary emesis. He describes generalized weakness and fatigue. He denies any shortness of breath. He denies any history of fever.   Past Medical History  Diagnosis Date  . Rheumatoid arthritis(714.0)     Patient Active Problem List   Diagnosis Date Noted  . Rheumatoid arthritis(714.0)     Past Surgical History  Procedure Laterality Date  . Leg surgery Right     Past Surgical History  Procedure Laterality Date  . Leg surgery Right     Current Outpatient Rx  Name  Route  Sig  Dispense  Refill  . amoxicillin (AMOXIL) 500 MG tablet   Oral   Take 1 tablet (500 mg total) by mouth 2 (two) times daily.   20 tablet   0   . cyclobenzaprine (FLEXERIL) 10 MG tablet   Oral   Take 1 tablet (10 mg total) by mouth every 8 (eight) hours as needed for muscle spasms.   30 tablet   1   . ibuprofen (ADVIL,MOTRIN) 600 MG tablet   Oral   Take 1 tablet (600 mg total) by mouth every 8 (eight) hours as needed.   30 tablet   0   . levofloxacin (LEVAQUIN) 500 MG tablet   Oral   Take 1 tablet (500 mg total) by mouth daily.   10 tablet   0   . oxyCODONE-acetaminophen (ROXICET) 5-325 MG tablet   Oral   Take 1-2 tablets by mouth every 4 (four) hours as needed for severe pain.   15 tablet    0     Allergies:  Review of patient's allergies indicates no known allergies.  Family History: No family history on file.  Social History: Social History  Substance Use Topics  . Smoking status: Current Every Day Smoker -- 1.00 packs/day    Types: Cigarettes  . Smokeless tobacco: Never Used  . Alcohol Use: No     Review of Systems:   10 point review of systems was performed and was otherwise negative:  Constitutional: No fever Eyes: No visual disturbances ENT: No sore throat, ear pain Cardiac: No chest pain Respiratory: No shortness of breath, wheezing, or stridor Abdomen: Patient points to the epigastric area as the source of his abdominal pain. Eyes any lower abdominal pain. Endocrine: No weight loss, No night sweats Extremities: No peripheral edema, cyanosis Skin: No rashes, easy bruising Neurologic: No focal weakness, trouble with speech or swollowing Urologic: No dysuria, Hematuria, or urinary frequency   Physical Exam:  ED Triage Vitals  Enc Vitals Group     BP 02/11/16 1401 140/75 mmHg     Pulse Rate 02/11/16 1401 138     Resp 02/11/16 1401 20     Temp 02/11/16 1401 98 F (36.7 C)     Temp Source 02/11/16 1401 Oral     SpO2 02/11/16 1401 100 %  Weight 02/11/16 1401 100 lb (45.36 kg)     Height 02/11/16 1401 5\' 7"  (1.702 m)     Head Cir --      Peak Flow --      Pain Score --      Pain Loc --      Pain Edu? --      Excl. in GC? --     General: Awake , Alert , and Oriented times 3. Glasgow Coma Scale 15. No signs of respiratory distress. Cachectic in appearance Head: Normal cephalic , atraumatic Eyes: Pupils equal , round, reactive to light Nose/Throat: No nasal drainage, patent upper airway without erythema or exudate.  Neck: Supple, Full range of motion, No anterior adenopathy or palpable thyroid masses Lungs: Clear to ascultation without wheezes , rhonchi, or rales Heart: Tachycardia without murmurs , gallops , or rubs Abdomen: Tender  primarily over the epigastric area without rebound, guarding , or rigidity; bowel sounds positive and symmetric in all 4 quadrants. No organomegaly .        Extremities: 2 plus symmetric pulses. No edema, clubbing or cyanosis Neurologic: normal ambulation, Motor symmetric without deficits, sensory intact Skin: warm, dry, no rashes   Labs:   All laboratory work was reviewed including any pertinent negatives or positives listed below:  Labs Reviewed  COMPREHENSIVE METABOLIC PANEL - Abnormal; Notable for the following:    Chloride 94 (*)    Glucose, Bld 177 (*)    Total Protein 8.9 (*)    AST 48 (*)    Alkaline Phosphatase 260 (*)    All other components within normal limits  CBC - Abnormal; Notable for the following:    RBC 5.95 (*)    MCV 77.1 (*)    MCH 25.8 (*)    All other components within normal limits  LIPASE, BLOOD  URINALYSIS COMPLETEWITH MICROSCOPIC ([facility] ONLY)  ETHANOL   reviewed the patient's laboratory work shows an increasing troponin Patient has persistent elevated alkaline phosphatase of unknown importance EKG: ED ECG REPORT I, , the attending physician, personally viewed and interpreted this ECG.  Date: 02/11/2016 EKG Time: 1530 Rate: 150 Rhythm: Sinus tachycardia QRS Axis: normal Intervals: normal ST/T Wave abnormalities: Nonspecific ST changes may be rate dependent Conduction Disturbances: none Narrative Interpretation: unremarkable No acute ischemic changes  Radiology:    EXAM: DG ABDOMEN ACUTE W/ 1V CHEST  COMPARISON: Chest CT 02/05/2016 and chest x-ray 02/05/2016.  FINDINGS: The upright chest x-ray demonstrates stable advanced emphysematous changes and pulmonary. Stable streaky right basilar airspace opacity likely resolving infiltrate. Artifact from an EKG lead noted over the left lower chest.  Two views of the abdomen demonstrate an unremarkable bowel gas pattern except for moderate stool throughout the colon and down  into the rectum. No findings for small bowel obstruction or free air. The soft tissue shadows are maintained. The bony structures are intact.  IMPRESSION: 1. Stable emphysematous changes and pulmonary scarring. 2. Persistent right basilar airspace opacity. 3. No acute abdominal findings. Moderate stool throughout the colon.      I personally reviewed the radiologic studies     ED Course: * Patient's stay here showed some decrease in his heart rate with the administration of IV fluids and Ativan. Patient has history of polysubstance abuse and will not respond to the question of whether or not he had recent cocaine usage. The patient's troponin appears to be increasing and he also has what shows to be some unimproving pneumonia. He has been  on oral antibiotics. Patient I felt required further inpatient management but for the vast majority of his stay here in emergency department is been a very reluctant historian and resisting treatment. I did review the case with the hospitalist team, who agreed to see and evaluate the patient: Further disposition and management depends upon their evaluation    Assessment:  Polysubstance abuse Possible cocaine-related chest pain      Plan: * Internal medicine evaluation            Jennye Moccasin, MD 02/11/16 2057

## 2016-02-11 NOTE — ED Notes (Signed)
Pulled pt in bed. Pts belongings from his pockets on the floor. Small bag with white "chalklike" substance found on the floor. Substance given to charge nurse and Police notified.

## 2016-02-12 ENCOUNTER — Inpatient Hospital Stay
Admit: 2016-02-12 | Discharge: 2016-02-12 | Disposition: A | Discharge status: Home / Self Care | Payer: Self-pay | Attending: Internal Medicine | Admitting: Internal Medicine

## 2016-02-12 LAB — CBC
HCT: 39.9 % — ABNORMAL LOW (ref 40.0–52.0)
Hemoglobin: 13.2 g/dL (ref 13.0–18.0)
MCH: 25.6 pg — ABNORMAL LOW (ref 26.0–34.0)
MCHC: 33.1 g/dL (ref 32.0–36.0)
MCV: 77.4 fL — ABNORMAL LOW (ref 80.0–100.0)
Platelets: 258 10*3/uL (ref 150–440)
RBC: 5.16 MIL/uL (ref 4.40–5.90)
RDW: 14.2 % (ref 11.5–14.5)
WBC: 8.3 10*3/uL (ref 3.8–10.6)

## 2016-02-12 LAB — BASIC METABOLIC PANEL
Anion gap: 9 (ref 5–15)
BUN: 15 mg/dL (ref 6–20)
CALCIUM: 9.1 mg/dL (ref 8.9–10.3)
CO2: 26 mmol/L (ref 22–32)
Chloride: 100 mmol/L — ABNORMAL LOW (ref 101–111)
Creatinine, Ser: 0.71 mg/dL (ref 0.61–1.24)
GFR calc Af Amer: >60 mL/min (ref >60)
GLUCOSE: 111 mg/dL — AB (ref 65–99)
Potassium: 3.2 mmol/L — ABNORMAL LOW (ref 3.5–5.1)
Sodium: 135 mmol/L (ref 135–145)

## 2016-02-12 LAB — ECHOCARDIOGRAM COMPLETE
HEIGHTINCHES: 67 in
WEIGHTICAEL: 1473.6 [oz_av]

## 2016-02-12 LAB — T4, FREE: Free T4: 3.98 ng/dL — ABNORMAL HIGH (ref 0.61–1.12)

## 2016-02-12 LAB — MRSA PCR SCREENING: MRSA by PCR: POSITIVE — AB

## 2016-02-12 LAB — TROPONIN I
TROPONIN I: 0.04 ng/mL — AB (ref <0.031)
TROPONIN I: 0.26 ng/mL — AB (ref <0.031)

## 2016-02-12 MED ORDER — DILTIAZEM HCL 30 MG PO TABS
30.0000 mg | ORAL_TABLET | Freq: Three times a day (TID) | ORAL | Status: DC
  Administered 2016-02-12 – 2016-02-13 (×5): 30 mg via ORAL
  Filled 2016-02-12 (×5): qty 1

## 2016-02-12 MED ORDER — OXYCODONE HCL 5 MG PO TABS
5.0000 mg | ORAL_TABLET | ORAL | Status: DC | PRN
  Administered 2016-02-12 – 2016-02-13 (×5): 5 mg via ORAL
  Filled 2016-02-12 (×5): qty 1

## 2016-02-12 MED ORDER — ENOXAPARIN SODIUM 40 MG/0.4ML ~~LOC~~ SOLN
1.0000 mg/kg | Freq: Two times a day (BID) | SUBCUTANEOUS | Status: DC
  Administered 2016-02-12 – 2016-02-13 (×2): 40 mg via SUBCUTANEOUS
  Filled 2016-02-12 (×2): qty 0.4

## 2016-02-12 MED ORDER — POTASSIUM CHLORIDE CRYS ER 20 MEQ PO TBCR
40.0000 meq | EXTENDED_RELEASE_TABLET | ORAL | Status: AC
  Administered 2016-02-12 (×2): 40 meq via ORAL
  Filled 2016-02-12 (×2): qty 2

## 2016-02-12 MED ORDER — SODIUM CHLORIDE 0.9 % IV SOLN
INTRAVENOUS | Status: AC
  Administered 2016-02-12: 02:00:00 via INTRAVENOUS

## 2016-02-12 MED ORDER — SODIUM CHLORIDE 0.9 % IV SOLN
INTRAVENOUS | Status: AC
Start: 2016-02-12 — End: 2016-02-13
  Administered 2016-02-12: 15:00:00 via INTRAVENOUS

## 2016-02-12 MED ORDER — ENOXAPARIN SODIUM 40 MG/0.4ML ~~LOC~~ SOLN: 1.0000 mg/kg | SUBCUTANEOUS | Status: DC

## 2016-02-12 MED ORDER — MUPIROCIN 2 % EX OINT
1.0000 "application " | TOPICAL_OINTMENT | Freq: Two times a day (BID) | CUTANEOUS | Status: DC
  Administered 2016-02-12 – 2016-02-13 (×2): 1 via NASAL
  Filled 2016-02-12: qty 22

## 2016-02-12 MED ORDER — TRAZODONE HCL 50 MG PO TABS
50.0000 mg | ORAL_TABLET | Freq: Every evening | ORAL | Status: DC | PRN
  Administered 2016-02-12: 50 mg via ORAL
  Filled 2016-02-12: qty 1

## 2016-02-12 NOTE — Progress Notes (Signed)
Patient c/o back pain. Dr. Clint Guy notified and verbal order received for oxycodone. Ordered placed. Trudee Kuster

## 2016-02-12 NOTE — Progress Notes (Signed)
Patient's HR sustaining in the 120s-130s and occasionally jumping into the 140s. MD notified. Orders for 30mg  PO cardizem and NS @ 73mL/hr placed and acknowledged. Nursing staff will continue to monitor. 72m, RN

## 2016-02-12 NOTE — Progress Notes (Signed)
Dr. Clint Guy notified of elevated troponins, no new orders at this time. Trudee Kuster

## 2016-02-12 NOTE — Progress Notes (Signed)
Pt requesting that bed alarm be left off while family is in room. Family states they will toilet the patient, educated to call nursing if help is needed for any reason and to notify RN when family leaves.

## 2016-02-12 NOTE — Care Management (Signed)
Patient with frequent ED visits.  he is admitted with elevated troponins, thought to be demand- cardiology is involved.  Also found to have pneumonia and abnormal thyroid function tests.  He is without a payor.  Currently on cardizem which is not on the [employer] four dollar list.  Cardizem XR 180 and 240 doses are on the [facility] formulary.

## 2016-02-12 NOTE — Progress Notes (Signed)
Dr. Juliene Pina notified of positive MRSA screening. Trudee Kuster

## 2016-02-12 NOTE — Consult Note (Signed)
Paul Day is a 60 y.o. male  [id number]  Primary Cardiologist: Adrian Blackwater Reason for Consultation: unstable angina  HPI: Paul Day is a 60 year old mail that presented with epigastric pain, nausea and vomiting. He is currently being treated for pneumonia with levaquin. He returned with same complaints of epigastic pain, nausea and vomiting. His initial troponin was 0.10 with subsequent levels of 0.04 and 0.04. He is also known to use cocaine. He has no history of cardiac issues, hypertension or hyperlipidemia that he is aware of. He does smoke.  He is currently chest pain free and without any breathing difficulty. He is anxious and asking for something for his nerves.    Review of Systems: Negative for shortness of breath, edema, palpitations Positive for epigastric pain with nausea that is resolved at present.   Past Medical History  Diagnosis Date  . Rheumatoid arthritis(714.0)     Medications Prior to Admission  Medication Sig Dispense Refill  . cyclobenzaprine (FLEXERIL) 10 MG tablet Take 1 tablet (10 mg total) by mouth every 8 (eight) hours as needed for muscle spasms. 30 tablet 1  . ibuprofen (ADVIL,MOTRIN) 600 MG tablet Take 600 mg by mouth every 8 (eight) hours as needed for mild pain.    Marland Kitchen levofloxacin (LEVAQUIN) 500 MG tablet Take 1 tablet (500 mg total) by mouth daily. 10 tablet 0  . amoxicillin (AMOXIL) 500 MG tablet Take 1 tablet (500 mg total) by mouth 2 (two) times daily. (Patient not taking: Reported on 02/11/2016) 20 tablet 0  . oxyCODONE-acetaminophen (ROXICET) 5-325 MG tablet Take 1-2 tablets by mouth every 4 (four) hours as needed for severe pain. (Patient not taking: Reported on 02/11/2016) 15 tablet 0     . aspirin EC  325 mg Oral Once  . diltiazem  30 mg Oral 3 times per day  . enoxaparin (LOVENOX) injection  40 mg Subcutaneous Q24H  . piperacillin-tazobactam (ZOSYN)  IV  3.375 g Intravenous 3 times per day  . potassium chloride  40 mEq Oral Q4H  .  sodium chloride flush  3 mL Intravenous Q12H  . vancomycin  750 mg Intravenous Q18H    Infusions: . sodium chloride 75 mL/hr at 02/12/16 0139    Allergies  Allergen Reactions  . Norco [Hydrocodone-Acetaminophen] Itching and Rash    Social History   Social History  . Marital Status: Married    Spouse Name: N/A  . Number of Children: N/A  . Years of Education: N/A   Occupational History  . Not on file.   Social History Main Topics  . Smoking status: Current Every Day Smoker -- 1.00 packs/day    Types: Cigarettes  . Smokeless tobacco: Never Used  . Alcohol Use: No  . Drug Use: Yes    Special: Cocaine, Marijuana  . Sexual Activity: Not on file   Other Topics Concern  . Not on file   Social History Narrative    No family history on file.  PHYSICAL EXAM: Filed Vitals:   02/11/16 2244 02/12/16 0602  BP: 146/69 140/72  Pulse: 99 119  Temp: 98.6 F (37 C) 98.3 F (36.8 C)  Resp: 22 20     Intake/Output Summary (Last 24 hours) at 02/12/16 0949 Last data filed at 02/12/16 0603  Gross per 24 hour  Intake      0 ml  Output    700 ml  Net   -700 ml    General:  Very thin man.  No respiratory difficulty HEENT:  normal Neck: supple. no JVD. Carotids 2+ bilat; no bruits. No lymphadenopathy or thryomegaly appreciated. Cor: PMI nondisplaced. Regular rate & rhythm. No rubs, gallops or murmurs. Lungs: clear Abdomen: soft, nontender, nondistended. No hepatosplenomegaly. No bruits or masses. Good bowel sounds. Extremities: no cyanosis, clubbing, rash, edema Neuro: alert & oriented x 3, cranial nerves grossly intact. moves all 4 extremities w/o difficulty. Affect is anxious.  ECG: Sinus tachycardia at 150 bpm last night.  Results for orders placed or performed during the hospital encounter of 02/11/16 (from the past 24 hour(s))  Lipase, blood     Status: None   Collection Time: 02/11/16  2:04 PM  Result Value Ref Range   Lipase 30 11 - 51 U/L  Comprehensive  metabolic panel     Status: Abnormal   Collection Time: 02/11/16  2:04 PM  Result Value Ref Range   Sodium 137 135 - 145 mmol/L   Potassium 3.6 3.5 - 5.1 mmol/L   Chloride 94 (L) 101 - 111 mmol/L   CO2 31 22 - 32 mmol/L   Glucose, Bld 177 (H) 65 - 99 mg/dL   BUN 19 6 - 20 mg/dL   Creatinine, Ser 4.43 0.61 - 1.24 mg/dL   Calcium 15.4 8.9 - 00.8 mg/dL   Total Protein 8.9 (H) 6.5 - 8.1 g/dL   Albumin 4.0 3.5 - 5.0 g/dL   AST 48 (H) 15 - 41 U/L   ALT 41 17 - 63 U/L   Alkaline Phosphatase 260 (H) 38 - 126 U/L   Total Bilirubin 0.6 0.3 - 1.2 mg/dL   GFR calc non Af Amer >60 >60 mL/min   GFR calc Af Amer >60 >60 mL/min   Anion gap 12 5 - 15  CBC     Status: Abnormal   Collection Time: 02/11/16  2:04 PM  Result Value Ref Range   WBC 7.5 3.8 - 10.6 K/uL   RBC 5.95 (H) 4.40 - 5.90 MIL/uL   Hemoglobin 15.3 13.0 - 18.0 g/dL   HCT 67.6 19.5 - 09.3 %   MCV 77.1 (L) 80.0 - 100.0 fL   MCH 25.8 (L) 26.0 - 34.0 pg   MCHC 33.5 32.0 - 36.0 g/dL   RDW 26.7 12.4 - 58.0 %   Platelets 307 150 - 440 K/uL  Ethanol     Status: None   Collection Time: 02/11/16  2:04 PM  Result Value Ref Range   Alcohol, Ethyl (B) <5 <5 mg/dL  Troponin I     Status: Abnormal   Collection Time: 02/11/16  2:04 PM  Result Value Ref Range   Troponin I 0.07 (H) <0.031 ng/mL  Differential     Status: None   Collection Time: 02/11/16  2:04 PM  Result Value Ref Range   Neutrophils Relative % 78 %   Neutro Abs 6.1 1.4 - 6.5 K/uL   Lymphocytes Relative 16 %   Lymphs Abs 1.3 1.0 - 3.6 K/uL   Monocytes Relative 6 %   Monocytes Absolute 0.4 0.2 - 1.0 K/uL   Eosinophils Relative 0 %   Eosinophils Absolute 0.0 0 - 0.7 K/uL   Basophils Relative 0 %   Basophils Absolute 0.0 0 - 0.1 K/uL  Troponin I     Status: Abnormal   Collection Time: 02/11/16  7:22 PM  Result Value Ref Range   Troponin I 0.10 (H) <0.031 ng/mL  TSH     Status: Abnormal   Collection Time: 02/11/16  7:22 PM  Result Value Ref Range  TSH 0.200 (L)  0.350 - 4.500 uIU/mL  Lactic acid, plasma     Status: None   Collection Time: 02/11/16 10:49 PM  Result Value Ref Range   Lactic Acid, Venous 1.3 0.5 - 2.0 mmol/L  CBC     Status: Abnormal   Collection Time: 02/11/16 10:49 PM  Result Value Ref Range   WBC 6.3 3.8 - 10.6 K/uL   RBC 5.23 4.40 - 5.90 MIL/uL   Hemoglobin 13.5 13.0 - 18.0 g/dL   HCT 79.1 50.5 - 69.7 %   MCV 77.8 (L) 80.0 - 100.0 fL   MCH 25.8 (L) 26.0 - 34.0 pg   MCHC 33.1 32.0 - 36.0 g/dL   RDW 94.8 01.6 - 55.3 %   Platelets 242 150 - 440 K/uL  Creatinine, serum     Status: Abnormal   Collection Time: 02/11/16 10:49 PM  Result Value Ref Range   Creatinine, Ser 0.55 (L) 0.61 - 1.24 mg/dL   GFR calc non Af Amer >60 >60 mL/min   GFR calc Af Amer >60 >60 mL/min  Troponin I     Status: Abnormal   Collection Time: 02/11/16 10:49 PM  Result Value Ref Range   Troponin I 0.04 (H) <0.031 ng/mL  Troponin I     Status: Abnormal   Collection Time: 02/12/16  4:46 AM  Result Value Ref Range   Troponin I 0.04 (H) <0.031 ng/mL  Basic metabolic panel     Status: Abnormal   Collection Time: 02/12/16  4:46 AM  Result Value Ref Range   Sodium 135 135 - 145 mmol/L   Potassium 3.2 (L) 3.5 - 5.1 mmol/L   Chloride 100 (L) 101 - 111 mmol/L   CO2 26 22 - 32 mmol/L   Glucose, Bld 111 (H) 65 - 99 mg/dL   BUN 15 6 - 20 mg/dL   Creatinine, Ser 7.48 0.61 - 1.24 mg/dL   Calcium 9.1 8.9 - 27.0 mg/dL   GFR calc non Af Amer >60 >60 mL/min   GFR calc Af Amer >60 >60 mL/min   Anion gap 9 5 - 15  CBC     Status: Abnormal   Collection Time: 02/12/16  4:46 AM  Result Value Ref Range   WBC 8.3 3.8 - 10.6 K/uL   RBC 5.16 4.40 - 5.90 MIL/uL   Hemoglobin 13.2 13.0 - 18.0 g/dL   HCT 78.6 (L) 75.4 - 49.2 %   MCV 77.4 (L) 80.0 - 100.0 fL   MCH 25.6 (L) 26.0 - 34.0 pg   MCHC 33.1 32.0 - 36.0 g/dL   RDW 01.0 07.1 - 21.9 %   Platelets 258 150 - 440 K/uL   Dg Abd Acute W/chest  02/11/2016  CLINICAL DATA:  3-4 day history of nausea, vomiting and  abdominal pain. EXAM: DG ABDOMEN ACUTE W/ 1V CHEST COMPARISON:  Chest CT 02/05/2016 and chest x-ray 02/05/2016. FINDINGS: The upright chest x-ray demonstrates stable advanced emphysematous changes and pulmonary. Stable streaky right basilar airspace opacity likely resolving infiltrate. Artifact from an EKG lead noted over the left lower chest. Two views of the abdomen demonstrate an unremarkable bowel gas pattern except for moderate stool throughout the colon and down into the rectum. No findings for small bowel obstruction or free air. The soft tissue shadows are maintained. The bony structures are intact. IMPRESSION: 1. Stable emphysematous changes and pulmonary scarring. 2. Persistent right basilar airspace opacity. 3. No acute abdominal findings. Moderate stool throughout the colon. Electronically Signed   By:  Rudie Meyer M.D.   On: 02/11/2016 17:53     ASSESSMENT AND PLAN: Pt with epigastric pain, nausea and vomiting. He is being treated for pneumonia and has hypokalemia. His tachycardia is likely related to his respiratory illness and mildly elevated troponin may be demand ischemia from tachycardia. Agree with hydration and potassium repletion. Echo has been ordered. No further testing at present. Treat underlying illness.  Berton Bon, NP 02/12/2016 9:49 AM

## 2016-02-12 NOTE — Progress Notes (Signed)
*  PRELIMINARY RESULTS* Echocardiogram 2D Echocardiogram has been performed.  Paul Day Hege 02/12/2016, 9:17 AM

## 2016-02-12 NOTE — Progress Notes (Signed)
[facility] [place] [facility]   PATIENT NAME: Paul Day    MR#:  [id number]  DATE OF BIRTH:  February 27, 1956  SUBJECTIVE:   Patient admitted yesterday with complaints of nausea vomiting found to be tachycardic with elevated troponin Overnight no episodes of chest pain remained tachycardic Complains this morning of feeling anxious REVIEW OF SYSTEMS:  CONSTITUTIONAL: No fever, fatigue or weakness.  EYES: No blurred or double vision.  EARS, NOSE, AND THROAT: No tinnitus or ear pain.  RESPIRATORY: No cough, shortness of breath, wheezing or hemoptysis.  CARDIOVASCULAR: No chest pain, orthopnea, edema.  GASTROINTESTINAL: No nausea, vomiting, diarrhea or abdominal pain.  GENITOURINARY: No dysuria, hematuria.  ENDOCRINE: No polyuria, nocturia,  HEMATOLOGY: No anemia, easy bruising or bleeding SKIN: No rash or lesion. MUSCULOSKELETAL: No joint pain or arthritis.   NEUROLOGIC: No tingling, numbness, weakness.  PSYCHIATRY: Positive anxiety denies depression.   DRUG ALLERGIES:   Allergies  Allergen Reactions  . Norco [Hydrocodone-Acetaminophen] Itching and Rash    VITALS:  Blood pressure 138/81, pulse 104, temperature 97.9 F (36.6 C), temperature source Oral, resp. rate 17, height 5\' 7"  (1.702 m), weight 41.776 kg (92 lb 1.6 oz), SpO2 96 %.  PHYSICAL EXAMINATION:  VITAL SIGNS: Filed Vitals:   02/12/16 1029 02/12/16 1223  BP: 165/88 138/81  Pulse: 108 104  Temp:  97.9 F (36.6 C)  Resp:  17   GENERAL:59 y.o.male currently in no acute distress. Chronically ill appearing HEAD: Normocephalic, atraumatic.  EYES: Pupils equal, round, reactive to light. Extraocular muscles intact. No scleral icterus.  MOUTH: Moist mucosal membrane. Dentition intact. No abscess noted.  EAR, NOSE, THROAT: Clear without exudates. No external lesions.  NECK: Supple. No thyromegaly. No nodules. No JVD.  PULMONARY: Clear to ascultation, without wheeze rails or rhonci. No use  of accessory muscles, Good respiratory effort. good air entry bilaterally CHEST: Nontender to palpation.  CARDIOVASCULAR: S1 and S2. Tachycardic No murmurs, rubs, or gallops. No edema. Pedal pulses 2+ bilaterally.  GASTROINTESTINAL: Soft, nontender, nondistended. No masses. Positive bowel sounds. No hepatosplenomegaly.  MUSCULOSKELETAL: No swelling, clubbing, or edema. Range of motion full in all extremities.  NEUROLOGIC: Cranial nerves II through XII are intact. No gross focal neurological deficits. Sensation intact. Reflexes intact.  SKIN: No ulceration, lesions, rashes, or cyanosis. Skin warm and dry. Turgor intact.  PSYCHIATRIC: Mood, affect blunted. The patient is awake, alert and oriented x 3. Insight, judgment intact.      LABORATORY PANEL:   CBC  Recent Labs Lab 02/12/16 0446  WBC 8.3  HGB 13.2  HCT 39.9*  PLT 258   ------------------------------------------------------------------------------------------------------------------  Chemistries   Recent Labs Lab 02/11/16 1404  02/12/16 0446  NA 137  --  135  K 3.6  --  3.2*  CL 94*  --  100*  CO2 31  --  26  GLUCOSE 177*  --  111*  BUN 19  --  15  CREATININE 0.86  < > 0.71  CALCIUM 10.1  --  9.1  AST 48*  --   --   ALT 41  --   --   ALKPHOS 260*  --   --   BILITOT 0.6  --   --   < > = values in this interval not displayed. ------------------------------------------------------------------------------------------------------------------  Cardiac Enzymes  Recent Labs Lab 02/12/16 1046  TROPONINI 0.26*   ------------------------------------------------------------------------------------------------------------------  RADIOLOGY:  Dg Abd Acute W/chest  02/11/2016  CLINICAL DATA:  3-4 day history of nausea, vomiting and abdominal  pain. EXAM: DG ABDOMEN ACUTE W/ 1V CHEST COMPARISON:  Chest CT 02/05/2016 and chest x-ray 02/05/2016. FINDINGS: The upright chest x-ray demonstrates stable advanced emphysematous  changes and pulmonary. Stable streaky right basilar airspace opacity likely resolving infiltrate. Artifact from an EKG lead noted over the left lower chest. Two views of the abdomen demonstrate an unremarkable bowel gas pattern except for moderate stool throughout the colon and down into the rectum. No findings for small bowel obstruction or free air. The soft tissue shadows are maintained. The bony structures are intact. IMPRESSION: 1. Stable emphysematous changes and pulmonary scarring. 2. Persistent right basilar airspace opacity. 3. No acute abdominal findings. Moderate stool throughout the colon. Electronically Signed   By: Rudie Meyer M.D.   On: 02/11/2016 17:53    EKG:   Orders placed or performed during the hospital encounter of 02/11/16  . EKG 12-Lead  . EKG 12-Lead    ASSESSMENT AND PLAN:   60 year old Caucasian gentleman admitted with nausea and vomiting elevated troponin 02/11/2016  1. Elevated troponin: Originally cardiac enzymes downward trend however repeat reveals elevation this is still likely demand given tachycardia however, we'll cover her with therapeutic Lovenox until more results are obtained. Cardiology is following input appreciated 2. Abnormal thyroid function tests: Check free T4 this morning found to be elevated given tachycardia and thyroid tests question hyperthyroidism would avoid beta blockade until urine drug screen returns given history of cocaine abuse-consult endocrinology for their input 3. Community acquired pneumonia: Follow culture data he was increased to vancomycin and Zosyn on admission given was previously on Levaquin continue these antibiotics for now follow cultures and downgrade antibiotics as appropriate 4. Venous thromboembolism prophylactic: Therapeutic Lovenox     All the records are reviewed and case discussed with Care Management/Social Workerr. Management plans discussed with the patient, family and they are in agreement.  CODE STATUS:  Full  TOTAL TIME TAKING CARE OF THIS PATIENT: 45 minutes.   POSSIBLE D/C IN 3-4 DAYS, DEPENDING ON CLINICAL CONDITION.   Hower,  Mardi Mainland.D on 02/12/2016 at 1:07 PM  Between 7am to 6pm - Pager - [phone]  After 6pm: House Pager: - [phone]  [facility]  Office  [phone]  CC: Primary care physician; Default, Provider, MD

## 2016-02-12 NOTE — Consult Note (Signed)
ENDOCRINOLOGY CONSULTATION  REFERRING PHYSICIAN: Angelica Ran, MD CONSULTING PHYSICIAN:  A. Wendall Mola, MD  Chief complaint Hyperthyroidism  History of present illness Paul Day is a 60 y.o. admitted yesterday with chest pain and N/V one week after diagnosis of pneumonia, seen in consultation for low TSH and elevated free T4. Labs have been notable for elevated troponin and Cardiology is following for probable demand ischemia. Yesterday TSH level was 0.02 uIU/ml (ref range 0.35-4.5) and today his free T4 was elevated at 3.98 ng/dl (ref range 2.69-4.8). He has denies a know prior history of thyroid disease. He did get contrast dye with a CT scan in the ED on 02/05/16.  He denies neck pain. He has had intermittent fever. No known use of amiodarone, lithium, or glucocorticoids. No heart racing or palpitations. No tremor. No anxiety or tremor or heat intolerance. + 20 lb weight loss over last 1-2 yrs. Has h/o polysubstance use including cocaine and opiates. He is uninsured. Has not had regular medical follow up. No known family h/o thyroid disease.   Medical history Past Medical History  Diagnosis Date  . Rheumatoid arthritis(714.0)      Surgical history Past Surgical History  Procedure Laterality Date  . Leg surgery Right      Medications . aspirin EC  325 mg Oral Once  . diltiazem  30 mg Oral 3 times per day  . enoxaparin (LOVENOX) injection  1 mg/kg Subcutaneous Q12H  . piperacillin-tazobactam (ZOSYN)  IV  3.375 g Intravenous 3 times per day  . sodium chloride flush  3 mL Intravenous Q12H  . vancomycin  750 mg Intravenous Q18H     Social history Social History  Substance Use Topics  . Smoking status: Current Every Day Smoker -- 1.00 packs/day    Types: Cigarettes  . Smokeless tobacco: Never Used  . Alcohol Use: No     Family history No family history on file.   Review of systems GENERAL:  Patient denies fevers.  Patient denies fatigue. Patient denies weight  gain. NEUROLOGIC:  Patient denies headaches. EYES:  Patient denies blurred vision. NECK:  Patient denies neck swelling. He has had neck pain and difficulty with swallowing. CARDIAC:  Patient denies chest pain . He has had palpitations. RESPIRATORY:  Patient has SOB.  Patient has a cough.   GASTROINTESTINAL:  Patient has abdominal pain. He has nausea and vomiting. Denies constipation. Patient denies blood in the stool. GENITOURINARY:  Patient denies dysuria and hematuria.  EXTREMITIES:  Patient denies leg swelling. HEME:  Patient denies easy bruisability. SKIN:  Patient denies any rash.   Exam BP 140/76 mmHg  Pulse 102  Temp(Src) 97.9 F (36.6 C) (Oral)  Resp 17  Ht 5\' 7"  (1.702 m)  Wt 41.776 kg (92 lb 1.6 oz)  BMI 14.42 kg/m2  SpO2 96% GEN: Thin white M in NAD. HEENT: No proptosis. EOMI. No lid lag or stare. Oropharynx is clear. Poor dentition.  NECK: supple, trachea midline, no thyromegaly. No neck TTP. No thyroid nodules are palpable.  RESPIRATORY: decreased BS throughout both lung fields. No wheeze. CV: No carotid bruits, +tachycardia. MUSCULOSKELETAL: no clubbing, no tremor of outstretched hands. ABD: soft, NT/ND, no organomegaly EXT: no peripheral edema SKIN: no dermatopathy or rash.   LYMPH: no submandibular or supraclavicular LAD NEURO: PERRL, speech very quit. Hearing intact. PSYC: alert and oriented, good insight  Laboratory/Radiology Lab Results  Component Value Date   TSH 0.200* 02/11/2016   Assessment Hyperthyroidism, likely due to silent thyroiditis vs contrast-induced  hyperthyroidism  Plan -We discussed hyperthyroidism, its signs, symptoms, causes, natural history and treatment.  -As he is fairly asymptomatic and has only mildly suppressed TSH, I favor silent thyroiditis or contrast-induced hyperthyroidism (from iodinated contrast exposure on 02/05/16) as cause of hyperthyroidism. -He was advised that hyperthyroidism is mild and that it likely will resolve  with time. -He should be reassessed as an out-patient in 3-4 weeks. He was advised about the [employer] coverage and advised to see me in follow up in 3-4 weeks. He was provided with my clinic information and I encouraged him to self schedule a follow up visit in 3-4 weeks.  -Avoid further contrast dye exposure, unless clinically required. -No treatment needed for hyperthyroidism at this time.  Thank you for the kind request for consultation.

## 2016-02-13 DIAGNOSIS — R000 Tachycardia, unspecified: Secondary | ICD-10-CM | POA: Diagnosis present

## 2016-02-13 LAB — URINALYSIS COMPLETE WITH MICROSCOPIC (ARMC ONLY)
BACTERIA UA: NONE SEEN
Bilirubin Urine: NEGATIVE
Glucose, UA: 150 mg/dL — AB
Ketones, ur: NEGATIVE mg/dL
Leukocytes, UA: NEGATIVE
NITRITE: NEGATIVE
PROTEIN: 30 mg/dL — AB
SQUAMOUS EPITHELIAL / LPF: NONE SEEN
Specific Gravity, Urine: 1.013 (ref 1.005–1.030)
pH: 6 (ref 5.0–8.0)

## 2016-02-13 LAB — URINE DRUG SCREEN, QUALITATIVE (ARMC ONLY)
AMPHETAMINES, UR SCREEN: NOT DETECTED
BENZODIAZEPINE, UR SCRN: NOT DETECTED
Barbiturates, Ur Screen: POSITIVE — AB
Cannabinoid 50 Ng, Ur ~~LOC~~: NOT DETECTED
Cocaine Metabolite,Ur ~~LOC~~: NOT DETECTED
MDMA (ECSTASY) UR SCREEN: NOT DETECTED
METHADONE SCREEN, URINE: NOT DETECTED
OPIATE, UR SCREEN: POSITIVE — AB
Phencyclidine (PCP) Ur S: NOT DETECTED
Tricyclic, Ur Screen: NOT DETECTED

## 2016-02-13 LAB — TROPONIN I

## 2016-02-13 MED ORDER — AZITHROMYCIN 250 MG PO TABS: 250.0000 mg | ORAL_TABLET | Freq: Every day | ORAL | Status: DC

## 2016-02-13 MED ORDER — POTASSIUM CHLORIDE CRYS ER 20 MEQ PO TBCR
20.0000 meq | EXTENDED_RELEASE_TABLET | Freq: Three times a day (TID) | ORAL | Status: DC
  Administered 2016-02-13: 20 meq via ORAL
  Filled 2016-02-13: qty 1

## 2016-02-13 MED ORDER — DILTIAZEM HCL ER COATED BEADS 120 MG PO CP24: 120.0000 mg | ORAL_CAPSULE | Freq: Every day | ORAL | Status: DC

## 2016-02-13 MED ORDER — AMOXICILLIN 500 MG PO TABS: 500.0000 mg | ORAL_TABLET | Freq: Two times a day (BID) | ORAL | Status: DC

## 2016-02-13 MED ORDER — OXYCODONE HCL 10 MG PO TABS: 10.0000 mg | ORAL_TABLET | Freq: Four times a day (QID) | ORAL | Status: DC | PRN

## 2016-02-13 NOTE — Progress Notes (Signed)
To Whom It May Concern Mr. Paul Day was under my care at [facility] 02/11/16--02/13/16 and should be excused from work/obligations for those dates. He may return to work 02/15/16 unless he is feeling marked improvement and wishes to return earlier.  Please call for concerns/questions Marge Duncans MD [phone]

## 2016-02-13 NOTE — Care Management (Signed)
Patient is for discharge home today. He does not have insurance he says up until 2 weeks ago, he worked Engineer, manufacturing.  He has shelter- has not filed for [facility] [employer] because he "is able to work."  Provided patient with applications for [facility] and [facility].  Faxed scripts to [facility] and discussed with patient that pain scripts can not be provided by the clinic.  Instructed patient and his son on completion of the [facility] application

## 2016-02-13 NOTE — Discharge Summary (Signed)
[facility] - [place] at [facility]   PATIENT NAME: Paul Day    MR#:  [id number]  DATE OF BIRTH:  12-27-1955  DATE OF ADMISSION:  02/11/2016 ADMITTING PHYSICIAN: Oralia Manis, MD  DATE OF DISCHARGE: No discharge date for patient encounter.  PRIMARY CARE PHYSICIAN: Default, Provider, MD    ADMISSION DIAGNOSIS:  Acute coronary syndrome (HCC) [I24.9]  DISCHARGE DIAGNOSIS:  Acute coronary syndrome-ruled out Community acquired pneumonia Abnormal thyroid function tests  SECONDARY DIAGNOSIS:   Past Medical History  Diagnosis Date  . Rheumatoid arthritis(714.0)     HOSPITAL COURSE:  Paul Day  is a 60 y.o. male admitted 02/11/2016 with chief complaint Nausea and vomiting. Please see H&P performed by Dr. Anne Hahn further information. Patient was noted to have elevated troponin on arrival as well as have tachycardia, through workup revealed to have abnormal thyroid function tests concerning for hyperthyroidism. Cardiology and endocrinology were consult during his hospitalization. The patient was noted to have sinus tachycardia placed on Cardizem with cardiology's assistance which helped improve his symptoms. No further testing required from cardiac standpoint or endocrinology standpoint they do have recommended following up as an outpatient. As far as pneumonia is concerned user to being treated for pneumonia as an outpatient it seems majority of his symptoms resolved around nausea and vomiting which have fortunately resolved denies any further shortness of breath or further symptomatology at on day of discharge. Stable for discharge  DISCHARGE CONDITIONS:   Stable/improved  CONSULTS OBTAINED:  Treatment Team:  Laurier Nancy, MD Raj Janus, MD  DRUG ALLERGIES:   Allergies  Allergen Reactions  . Norco [Hydrocodone-Acetaminophen] Itching and Rash    DISCHARGE MEDICATIONS:   Current Discharge Medication List    START taking these medications   Details  azithromycin (ZITHROMAX) 250 MG tablet Take 1 tablet (250 mg total) by mouth daily. Qty: 4 each, Refills: 0    oxyCODONE 10 MG TABS Take 1 tablet (10 mg total) by mouth every 6 (six) hours as needed for moderate pain. Qty: 30 tablet, Refills: 0      CONTINUE these medications which have CHANGED   Details  amoxicillin (AMOXIL) 500 MG tablet Take 1 tablet (500 mg total) by mouth 2 (two) times daily. Qty: 20 tablet, Refills: 0      CONTINUE these medications which have NOT CHANGED   Details  cyclobenzaprine (FLEXERIL) 10 MG tablet Take 1 tablet (10 mg total) by mouth every 8 (eight) hours as needed for muscle spasms. Qty: 30 tablet, Refills: 1    ibuprofen (ADVIL,MOTRIN) 600 MG tablet Take 600 mg by mouth every 8 (eight) hours as needed for mild pain.      STOP taking these medications     levofloxacin (LEVAQUIN) 500 MG tablet      oxyCODONE-acetaminophen (ROXICET) 5-325 MG tablet          DISCHARGE INSTRUCTIONS:    DIET:  Regular diet  DISCHARGE CONDITION:  Stable  ACTIVITY:  Activity as tolerated  OXYGEN:  Home Oxygen: No.   Oxygen Delivery: room air  DISCHARGE LOCATION:  home   If you experience worsening of your admission symptoms, develop shortness of breath, life threatening emergency, suicidal or homicidal thoughts you must seek medical attention immediately by calling 911 or calling your MD immediately  if symptoms less severe.  You Must read complete instructions/literature along with all the possible adverse reactions/side effects for all the Medicines you take and that have been prescribed to you. Take any new  Medicines after you have completely understood and accpet all the possible adverse reactions/side effects.   Please note  You were cared for by a hospitalist during your hospital stay. If you have any questions about your discharge medications or the care you received while you were in the hospital after you are discharged, you can call  the unit and asked to speak with the hospitalist on call if the hospitalist that took care of you is not available. Once you are discharged, your primary care physician will handle any further medical issues. Please note that NO REFILLS for any discharge medications will be authorized once you are discharged, as it is imperative that you return to your primary care physician (or establish a relationship with a primary care physician if you do not have one) for your aftercare needs so that they can reassess your need for medications and monitor your lab values.    On the day of Discharge:   VITAL SIGNS:  Blood pressure 127/73, pulse 99, temperature 98.1 F (36.7 C), temperature source Oral, resp. rate 18, height 5\' 7"  (1.702 m), weight 41.776 kg (92 lb 1.6 oz), SpO2 96 %.  I/O:   Intake/Output Summary (Last 24 hours) at 02/13/16 1231 Last data filed at 02/13/16 1157  Gross per 24 hour  Intake 578.75 ml  Output   1350 ml  Net -771.25 ml    PHYSICAL EXAMINATION:  GENERAL:  60 y.o.-year-old patient lying in the bed with no acute distress.  EYES: Pupils equal, round, reactive to light and accommodation. No scleral icterus. Extraocular muscles intact.  HEENT: Head atraumatic, normocephalic. Oropharynx and nasopharynx clear.  NECK:  Supple, no jugular venous distention. No thyroid enlargement, no tenderness.  LUNGS: Normal breath sounds bilaterally, no wheezing, rales,rhonchi or crepitation. No use of accessory muscles of respiration.  CARDIOVASCULAR: S1, S2 normal. No murmurs, rubs, or gallops.  ABDOMEN: Soft, non-tender, non-distended. Bowel sounds present. No organomegaly or mass.  EXTREMITIES: No pedal edema, cyanosis, or clubbing.  NEUROLOGIC: Cranial nerves II through XII are intact. Muscle strength 5/5 in all extremities. Sensation intact. Gait not checked.  PSYCHIATRIC: The patient is alert and oriented x 3.  SKIN: No obvious rash, lesion, or ulcer.   DATA REVIEW:   CBC  Recent  Labs Lab 02/12/16 0446  WBC 8.3  HGB 13.2  HCT 39.9*  PLT 258    Chemistries   Recent Labs Lab 02/11/16 1404  02/12/16 0446  NA 137  --  135  K 3.6  --  3.2*  CL 94*  --  100*  CO2 31  --  26  GLUCOSE 177*  --  111*  BUN 19  --  15  CREATININE 0.86  < > 0.71  CALCIUM 10.1  --  9.1  AST 48*  --   --   ALT 41  --   --   ALKPHOS 260*  --   --   BILITOT 0.6  --   --   < > = values in this interval not displayed.  Cardiac Enzymes  Recent Labs Lab 02/13/16 0927  TROPONINI <0.03    Microbiology Results  Results for orders placed or performed during the hospital encounter of 02/11/16  MRSA PCR Screening     Status: Abnormal   Collection Time: 02/12/16  4:22 PM  Result Value Ref Range Status   MRSA by PCR POSITIVE (A) NEGATIVE Final    Comment:        The GeneXpert MRSA Assay (FDA  approved for NASAL specimens only), is one component of a comprehensive MRSA colonization surveillance program. It is not intended to diagnose MRSA infection nor to guide or monitor treatment for MRSA infections. CRITICAL RESULT CALLED TO, READ BACK BY AND VERIFIED WITH:  [facility] [place] AT [id number] 02/12/16 SDR     RADIOLOGY:  Dg Abd Acute W/chest  02/11/2016  CLINICAL DATA:  3-4 day history of nausea, vomiting and abdominal pain. EXAM: DG ABDOMEN ACUTE W/ 1V CHEST COMPARISON:  Chest CT 02/05/2016 and chest x-ray 02/05/2016. FINDINGS: The upright chest x-ray demonstrates stable advanced emphysematous changes and pulmonary. Stable streaky right basilar airspace opacity likely resolving infiltrate. Artifact from an EKG lead noted over the left lower chest. Two views of the abdomen demonstrate an unremarkable bowel gas pattern except for moderate stool throughout the colon and down into the rectum. No findings for small bowel obstruction or free air. The soft tissue shadows are maintained. The bony structures are intact. IMPRESSION: 1. Stable emphysematous changes and pulmonary scarring. 2.  Persistent right basilar airspace opacity. 3. No acute abdominal findings. Moderate stool throughout the colon. Electronically Signed   By: Rudie Meyer M.D.   On: 02/11/2016 17:53     Management plans discussed with the patient, family and they are in agreement.  CODE STATUS:     Code Status Orders        Start     Ordered   02/11/16 2238  Full code   Continuous     02/11/16 2237    Code Status History    Date Active Date Inactive Code Status Order ID Comments User Context   This patient has a current code status but no historical code status.      TOTAL TIME TAKING CARE OF THIS PATIENT: 33 minutes.    Hower,  Mardi Mainland.D on 02/13/2016 at 12:31 PM  Between 7am to 6pm - Pager - [phone]  After 6pm go to [email or web address] - password [email or web address] [facility]  [place] [facility]  Office  [phone]  CC: Primary care physician; Default, Provider, MD

## 2016-02-13 NOTE — Progress Notes (Addendum)
SUBJECTIVE: Patient is more conversant today. He says that he presented to hospital due to projectile vomiting. He had been having left upper chest wall pain since he was hit by a large beam in the left upper back on last Monday at work. He relates the chest soreness in that spot to that injury. He also states that he frequently notes a fast heart rate at home as he sees the beating through his ribs, he is very thin. I discussed cocaine and ill effects on the heart and he says that he only uses it to relieve oral pain due to his poor dentition. He has only a few rotten teeth. I advised him that rubbing it on his gums will also get it into the systemic circulation and affect his heart.   Filed Vitals:   02/12/16 1655 02/12/16 1934 02/13/16 0554 02/13/16 0724  BP: 140/76 156/77 124/75   Pulse: 102 94 120 107  Temp:  98 F (36.7 C) 97.5 F (36.4 C)   TempSrc:  Oral Oral   Resp:  20 20   Height:      Weight:      SpO2:  98% 99%     Intake/Output Summary (Last 24 hours) at 02/13/16 0938 Last data filed at 02/13/16 0330  Gross per 24 hour  Intake 728.75 ml  Output    800 ml  Net -71.25 ml    LABS: Basic Metabolic Panel:  Recent Labs  98/33/82 1404 02/11/16 2249 02/12/16 0446  NA 137  --  135  K 3.6  --  3.2*  CL 94*  --  100*  CO2 31  --  26  GLUCOSE 177*  --  111*  BUN 19  --  15  CREATININE 0.86 0.55* 0.71  CALCIUM 10.1  --  9.1   Liver Function Tests:  Recent Labs  02/11/16 1404  AST 48*  ALT 41  ALKPHOS 260*  BILITOT 0.6  PROT 8.9*  ALBUMIN 4.0    Recent Labs  02/11/16 1404  LIPASE 30   CBC:  Recent Labs  02/11/16 1404 02/11/16 2249 02/12/16 0446  WBC 7.5 6.3 8.3  NEUTROABS 6.1  --   --   HGB 15.3 13.5 13.2  HCT 45.8 40.7 39.9*  MCV 77.1* 77.8* 77.4*  PLT 307 242 258   Cardiac Enzymes:  Recent Labs  02/11/16 2249 02/12/16 0446 02/12/16 1046  TROPONINI 0.04* 0.04* 0.26*   BNP: Invalid input(s): POCBNP D-Dimer: No results for  input(s): DDIMER in the last 72 hours. Hemoglobin A1C: No results for input(s): HGBA1C in the last 72 hours. Fasting Lipid Panel: No results for input(s): CHOL, HDL, LDLCALC, TRIG, CHOLHDL, LDLDIRECT in the last 72 hours. Thyroid Function Tests:  Recent Labs  02/11/16 1922  TSH 0.200*   Anemia Panel: No results for input(s): VITAMINB12, FOLATE, FERRITIN, TIBC, IRON, RETICCTPCT in the last 72 hours.   PHYSICAL EXAM General: Very thin male, in no acute distress HEENT:  Normocephalic and atramatic Neck:  No JVD.  Lungs: Clear bilaterally to auscultation and percussion. Heart: HRRR . Normal S1 and S2 without gallops or murmurs.  Abdomen: Bowel sounds are positive, abdomen soft and non-tender  Extremities: No clubbing, cyanosis or edema.   Neuro: Alert and oriented X 3. Psych:  Good affect, responds appropriately  TELEMETRY: sinus tachycardia at 108  Echo: Normal heart function with EF 65% and normal wall motion, but was a suboptimal study  ASSESSMENT AND PLAN:  1. Sinus tachycardia, perhaps related to  current sepsis or could be more chronic related to cocaine use or hyperthyroid condition. The patient is also very thin, which could be related to hyper thyroid. Endocrine is following. Pt is advised on detrimental effects of cocaine on the heart and also potential heart damage related to poor dental hygiene as he has frequent dental abscesses. He does not get regular primary care or dental care. Advised to get set up with clinic and follow up as his future health is in jeopardy.                Pt should be discharged on diltiazem. Advise to increase diltiazem and get on once daily regimen so that he     more likely to comply.   2. Elevated troponins: Likely demand ischemia related to #1. Should have follow up cardiac care and stress testing once recovered from infection and thyroid straightened out. We will see him in our office if he will come.    3. Hypokalemia:  Potassium  ordered.  Principal Problem:   Severe sepsis (HCC) Active Problems:   Rheumatoid arthritis (HCC)   Unstable angina (HCC)   CAP (community acquired pneumonia)    Berton Bon, NP 02/13/2016 9:38 AM

## 2016-02-13 NOTE — Progress Notes (Signed)
Pt's HR occasionally jumps to 130s-140s, but returns to 100s-110s. Pt is asymptomatic, resting comfortably, states he is feeling "much better". MD Dr. Sheryle Hail notified. RN will continue to monitor. Syliva Overman, RN

## 2016-02-13 NOTE — Progress Notes (Signed)
Pt. Discharged to home via wc. Discharge instructions and medication regimen reviewed at bedside with patient. Pt. verbalizes understanding of instructions and medication regimen. Prescriptions given to patient, all of them have been called to med-management except oxycodone, pt is aware of this. Patient assessment unchanged from this morning. TELE and IV discontinued per policy.

## 2016-02-14 LAB — URINE CULTURE: Culture: NO GROWTH

## 2016-02-22 ENCOUNTER — Ambulatory Visit: Payer: Self-pay

## 2016-05-24 ENCOUNTER — Emergency Department: Payer: Self-pay

## 2016-05-24 ENCOUNTER — Encounter: Payer: Self-pay | Admitting: Emergency Medicine

## 2016-05-24 ENCOUNTER — Other Ambulatory Visit: Payer: Self-pay

## 2016-05-24 ENCOUNTER — Emergency Department
Admission: EM | Admit: 2016-05-24 | Discharge: 2016-05-24 | Disposition: A | Discharge status: Home / Self Care | Payer: Self-pay | Attending: Emergency Medicine | Admitting: Emergency Medicine

## 2016-05-24 DIAGNOSIS — M069 Rheumatoid arthritis, unspecified: Secondary | ICD-10-CM | POA: Insufficient documentation

## 2016-05-24 DIAGNOSIS — F1490 Cocaine use, unspecified, uncomplicated: Secondary | ICD-10-CM | POA: Insufficient documentation

## 2016-05-24 DIAGNOSIS — F1290 Cannabis use, unspecified, uncomplicated: Secondary | ICD-10-CM | POA: Insufficient documentation

## 2016-05-24 DIAGNOSIS — R42 Dizziness and giddiness: Secondary | ICD-10-CM | POA: Insufficient documentation

## 2016-05-24 DIAGNOSIS — R197 Diarrhea, unspecified: Secondary | ICD-10-CM | POA: Insufficient documentation

## 2016-05-24 DIAGNOSIS — Z79899 Other long term (current) drug therapy: Secondary | ICD-10-CM | POA: Insufficient documentation

## 2016-05-24 DIAGNOSIS — R112 Nausea with vomiting, unspecified: Secondary | ICD-10-CM | POA: Insufficient documentation

## 2016-05-24 DIAGNOSIS — Z7982 Long term (current) use of aspirin: Secondary | ICD-10-CM | POA: Insufficient documentation

## 2016-05-24 DIAGNOSIS — F17210 Nicotine dependence, cigarettes, uncomplicated: Secondary | ICD-10-CM | POA: Insufficient documentation

## 2016-05-24 DIAGNOSIS — R531 Weakness: Secondary | ICD-10-CM

## 2016-05-24 LAB — COMPREHENSIVE METABOLIC PANEL
ALBUMIN: 3.4 g/dL — AB (ref 3.5–5.0)
ALT: 21 U/L (ref 17–63)
ANION GAP: 11 (ref 5–15)
AST: 26 U/L (ref 15–41)
Alkaline Phosphatase: 137 U/L — ABNORMAL HIGH (ref 38–126)
BILIRUBIN TOTAL: 0.8 mg/dL (ref 0.3–1.2)
BUN: 9 mg/dL (ref 6–20)
CHLORIDE: 100 mmol/L — AB (ref 101–111)
CO2: 25 mmol/L (ref 22–32)
Calcium: 9.7 mg/dL (ref 8.9–10.3)
Creatinine, Ser: 0.73 mg/dL (ref 0.61–1.24)
GFR calc Af Amer: >60 mL/min (ref >60)
GFR calc non Af Amer: >60 mL/min (ref >60)
GLUCOSE: 164 mg/dL — AB (ref 65–99)
POTASSIUM: 3.3 mmol/L — AB (ref 3.5–5.1)
Sodium: 136 mmol/L (ref 135–145)
TOTAL PROTEIN: 8.1 g/dL (ref 6.5–8.1)

## 2016-05-24 LAB — CBC
HEMATOCRIT: 37.9 % — AB (ref 40.0–52.0)
HEMOGLOBIN: 13 g/dL (ref 13.0–18.0)
MCH: 26.7 pg (ref 26.0–34.0)
MCHC: 34.2 g/dL (ref 32.0–36.0)
MCV: 78.1 fL — ABNORMAL LOW (ref 80.0–100.0)
Platelets: 197 10*3/uL (ref 150–440)
RBC: 4.85 MIL/uL (ref 4.40–5.90)
RDW: 13 % (ref 11.5–14.5)
WBC: 7.4 10*3/uL (ref 3.8–10.6)

## 2016-05-24 LAB — URINALYSIS COMPLETE WITH MICROSCOPIC (ARMC ONLY)
BACTERIA UA: NONE SEEN
Bilirubin Urine: NEGATIVE
Glucose, UA: NEGATIVE mg/dL
Ketones, ur: NEGATIVE mg/dL
Leukocytes, UA: NEGATIVE
NITRITE: NEGATIVE
PROTEIN: NEGATIVE mg/dL
SPECIFIC GRAVITY, URINE: 1.006 (ref 1.005–1.030)
Squamous Epithelial / LPF: NONE SEEN
pH: 7 (ref 5.0–8.0)

## 2016-05-24 LAB — LIPASE, BLOOD: LIPASE: 22 U/L (ref 11–51)

## 2016-05-24 LAB — TROPONIN I

## 2016-05-24 MED ORDER — SODIUM CHLORIDE 0.9 % IV BOLUS (SEPSIS)
1000.0000 mL | Freq: Once | INTRAVENOUS | Status: AC
  Administered 2016-05-24: 1000 mL via INTRAVENOUS

## 2016-05-24 MED ORDER — ONDANSETRON HCL 4 MG/2ML IJ SOLN
4.0000 mg | Freq: Once | INTRAMUSCULAR | Status: AC
  Administered 2016-05-24: 4 mg via INTRAVENOUS
  Filled 2016-05-24: qty 2

## 2016-05-24 NOTE — ED Notes (Addendum)
Pt comes in reporting N/V/D for the last 4 days. Pt denies abd pain but has some tenderness to RUQ upon palpation. Also reports that he has been unsteady on his feet with multiple falls at home. Can't verbalize whether he has been unsteady due to dizziness or weakness. Pt denies HA, reports blurred vision but states that is normal for him. Pt reports that a week ago Tuesday he was released from [facility] after having a heart attack. Pt denies any chest pain or SOB. Initially reported that he was released this past Tuesday but after this RN looked in Care Everywhere and asked if it was a week ago Tuesday due to him not having anything in the system for this past week pt said it was possible. EMS reports that pt has a history of heroin abuse. Pt reports that he last used heroin this morning, when asked how much, states, "Not much." when asked how much is not much, pt states, "very little."

## 2016-05-24 NOTE — Discharge Instructions (Signed)
You were given IV fluids here in the emergency department. Return to emergency department for any worsening condition including weakness, chest pain, trouble breathing, dizziness or passing gas, confusion or altered mental status, black or bloody stools, fever, or any other symptoms concerning to you. Please continue to finish your antibiotic vancomycin.  Diarrhea Diarrhea is frequent loose and watery bowel movements. It can cause you to feel weak and dehydrated. Dehydration can cause you to become tired and thirsty, have a dry mouth, and have decreased urination that often is dark yellow. Diarrhea is a sign of another problem, most often an infection that will not last long. In most cases, diarrhea typically lasts 2-3 days. However, it can last longer if it is a sign of something more serious. It is important to treat your diarrhea as directed by your caregiver to lessen or prevent future episodes of diarrhea. CAUSES  Some common causes include:  Gastrointestinal infections caused by viruses, bacteria, or parasites.  Food poisoning or food allergies.  Certain medicines, such as antibiotics, chemotherapy, and laxatives.  Artificial sweeteners and fructose.  Digestive disorders. HOME CARE INSTRUCTIONS  Ensure adequate fluid intake (hydration): Have 1 cup (8 oz) of fluid for each diarrhea episode. Avoid fluids that contain simple sugars or sports drinks, fruit juices, whole milk products, and sodas. Your urine should be clear or pale yellow if you are drinking enough fluids. Hydrate with an oral rehydration solution that you can purchase at pharmacies, retail stores, and online. You can prepare an oral rehydration solution at home by mixing the following ingredients together:   - tsp table salt.   tsp baking soda.   tsp salt substitute containing potassium chloride.  1  tablespoons sugar.  1 L (34 oz) of water.  Certain foods and beverages may increase the speed at which food moves  through the gastrointestinal (GI) tract. These foods and beverages should be avoided and include:  Caffeinated and alcoholic beverages.  High-fiber foods, such as raw fruits and vegetables, nuts, seeds, and whole grain breads and cereals.  Foods and beverages sweetened with sugar alcohols, such as xylitol, sorbitol, and mannitol.  Some foods may be well tolerated and may help thicken stool including:  Starchy foods, such as rice, toast, pasta, low-sugar cereal, oatmeal, grits, baked potatoes, crackers, and bagels.  Bananas.  Applesauce.  Add probiotic-rich foods to help increase healthy bacteria in the GI tract, such as yogurt and fermented milk products.  Wash your hands well after each diarrhea episode.  Only take over-the-counter or prescription medicines as directed by your caregiver.  Take a warm bath to relieve any burning or pain from frequent diarrhea episodes. SEEK IMMEDIATE MEDICAL CARE IF:   You are unable to keep fluids down.  You have persistent vomiting.  You have blood in your stool, or your stools are black and tarry.  You do not urinate in 6-8 hours, or there is only a small amount of very dark urine.  You have abdominal pain that increases or localizes.  You have weakness, dizziness, confusion, or light-headedness.  You have a severe headache.  Your diarrhea gets worse or does not get better.  You have a fever or persistent symptoms for more than 2-3 days.  You have a fever and your symptoms suddenly get worse. MAKE SURE YOU:   Understand these instructions.  Will watch your condition.  Will get help right away if you are not doing well or get worse.   This information is not intended to  replace advice given to you by your health care provider. Make sure you discuss any questions you have with your health care provider.   Document Released: 11/07/2002 Document Revised: 12/08/2014 Document Reviewed: 07/25/2012 Elsevier Interactive Patient  Education [employer].

## 2016-05-24 NOTE — ED Provider Notes (Signed)
Patient care was signed out to me at shift change from 8:30 AM from Dr. Zenda Alpers.  Patient is on vancomycin by mouth for C. difficile and reports that he is here for weakness and ongoing diarrhea.  Per Dr. Zenda Alpers, he was given IV fluids here and his laboratory studies are reassuring. Patient was able to take by mouth here. He had one episode of loose stool which went on to the ground and was not able to be collected, however he's not had any other episodes here.  When I checked on him around 9 AM he is somewhat chronically ill-appearing, but states that he feels a little bit better and he has his antibiotics. He is anxious to go check on his son who is admitted in the ICU at this hospital right now.  I agree there is no indication right now for the need of acute hospitalization with no fever, tachycardia, hypotension, high white blood cell count, acute renal failure, or significant continued diarrhea here in the emergency department and he is feeling better.  Patient will be discharged with return precautions.  Chest x-ray had been ordered because of documented respiratory rates in the 30s, although Dr. Zenda Alpers noted that the patient had no respiratory complaints or distress and was not tachypnea on her evaluations, and on my evaluation also patient is not tachypnea and has no respiratory complaint. His chest x-ray is clear.  Paul Rooks, MD 05/24/16 [id number]

## 2016-05-24 NOTE — ED Notes (Signed)
Pt resting in bed eyes closed.

## 2016-05-24 NOTE — ED Provider Notes (Signed)
[facility] Emergency Department Provider Note   ____________________________________________  Time seen: Approximately 228 AM  I have reviewed the triage vital signs and the nursing notes.   HISTORY  Chief Complaint Nausea; Emesis; and Weakness    HPI Paul Day is a 60 y.o. male who comes into the hospital today with diarrhea and not feeling well. The patient reports that he has vomited a little bit but he's had diarrhea every 20 minutes for the last week. He reports that the stool is watery and brown. He denies any abdominal pain denies headache denies blurred vision denies chest pain. He denies any sick contacts. The patient reports that he has been feeling dizzy and unsteady. The patient reports that he was at [facility] and had a heart attack last week. The patient also reports that he had been using heroin with his last use was this morning. The patient is here for evaluation.   Past Medical History  Diagnosis Date  . Rheumatoid arthritis(714.0)     Patient Active Problem List   Diagnosis Date Noted  . Tachycardia 02/13/2016  . Unstable angina (HCC) 02/11/2016  . CAP (community acquired pneumonia) 02/11/2016  . Severe sepsis (HCC) 02/11/2016  . Rheumatoid arthritis [facility])     Past Surgical History  Procedure Laterality Date  . Leg surgery Right     Current Outpatient Rx  Name  Route  Sig  Dispense  Refill  . aspirin (GOODSENSE ASPIRIN) 81 MG chewable tablet   Oral   Chew 81 mg by mouth daily.         Marland Kitchen atorvastatin (LIPITOR) 80 MG tablet   Oral   Take 80 mg by mouth every evening.         . lidocaine (XYLOCAINE) 5 % ointment   Topical   Apply 1 application topically 3 (three) times daily.         . methimazole (TAPAZOLE) 10 MG tablet   Oral   Take 20 mg by mouth daily.         . propranolol (INDERAL) 80 MG tablet   Oral   Take 80 mg by mouth 3 (three) times daily.           Allergies Norco  History reviewed. No  pertinent family history.  Social History Social History  Substance Use Topics  . Smoking status: Current Every Day Smoker -- 1.00 packs/day    Types: Cigarettes  . Smokeless tobacco: Never Used  . Alcohol Use: No    Review of Systems Constitutional: No fever/chills Eyes: No visual changes. ENT: No sore throat. Cardiovascular: Denies chest pain. Respiratory: Denies shortness of breath. Gastrointestinal: Vomiting and diarrhea Genitourinary: Negative for dysuria. Musculoskeletal: Negative for back pain. Skin: Negative for rash. Neurological: Negative for headaches, focal weakness or numbness.  10-point ROS otherwise negative.  ____________________________________________   PHYSICAL EXAM:  VITAL SIGNS: ED Triage Vitals  Enc Vitals Group     BP 05/24/16 0213 135/84 mmHg     Pulse Rate 05/24/16 0213 92     Resp 05/24/16 0213 22     Temp 05/24/16 0213 97.9 F (36.6 C)     Temp Source 05/24/16 0213 Oral     SpO2 05/24/16 0213 98 %     Weight 05/24/16 0213 100 lb (45.36 kg)     Height 05/24/16 0213 5\' 6"  (1.676 m)     Head Cir --      Peak Flow --      Pain Score  05/24/16 0215 0     Pain Loc --      Pain Edu? --      Excl. in GC? --     Constitutional: Alert and oriented. Well appearing and in no acute distress. Eyes: Conjunctivae are normal. PERRL. EOMI. Head: Atraumatic. Nose: No congestion/rhinnorhea. Mouth/Throat: Mucous membranes are Dry.  Oropharynx non-erythematous. Cardiovascular: Normal rate, regular rhythm. Grossly normal heart sounds.  Good peripheral circulation. Respiratory: Normal respiratory effort.  No retractions. Lungs CTAB. Gastrointestinal: Soft and nontender. No distention. Positive bowel sounds Musculoskeletal: No lower extremity tenderness nor edema.   Neurologic:  Normal speech and language.  Skin:  Skin is warm, dry and intact.  Psychiatric: Mood and affect are normal.   ____________________________________________   LABS (all labs  ordered are listed, but only abnormal results are displayed)  Labs Reviewed  COMPREHENSIVE METABOLIC PANEL - Abnormal; Notable for the following:    Potassium 3.3 (*)    Chloride 100 (*)    Glucose, Bld 164 (*)    Albumin 3.4 (*)    Alkaline Phosphatase 137 (*)    All other components within normal limits  CBC - Abnormal; Notable for the following:    HCT 37.9 (*)    MCV 78.1 (*)    All other components within normal limits  URINALYSIS COMPLETEWITH MICROSCOPIC ([facility] ONLY) - Abnormal; Notable for the following:    Color, Urine STRAW (*)    APPearance CLEAR (*)    Hgb urine dipstick 1+ (*)    All other components within normal limits  GASTROINTESTINAL PANEL BY PCR, STOOL (REPLACES STOOL CULTURE)  C DIFFICILE QUICK SCREEN W PCR REFLEX  LIPASE, BLOOD  TROPONIN I   ____________________________________________  EKG  ED ECG REPORT I, Rebecka Apley, the attending physician, personally viewed and interpreted this ECG.   Date: 05/24/2016  EKG Time: 209  Rate: 90  Rhythm: normal sinus rhythm  Axis: none  Intervals:none  ST&T Change: none  ____________________________________________  RADIOLOGY  none ____________________________________________   PROCEDURES  Procedure(s) performed: None  Critical Care performed: No  ____________________________________________   INITIAL IMPRESSION / ASSESSMENT AND PLAN / ED COURSE  Pertinent labs & imaging results that were available during my care of the patient were reviewed by me and considered in my medical decision making (see chart for details).  This is a 60 year old male who comes into the hospital today with diarrhea. The patient reports that he's been feeling dizzy and he is very dehydrated. He also reports that he's had some vomiting. The patient's blood work was unremarkable at this time I will give him some normal saline. The patient did have a significant amount of watery uncontrolled diarrhea in his room. We  will attempt to get a specimen to perform a culture as well as a C. difficile evaluation.  The patient received 2 L of normal saline and has been sleeping. He did have an episode of diarrhea here on the floor but were unable to get a sample. We will have the patient sit up and eat and then have him walk around. The patient did have C. difficile at [facility] but he should've completed his antibiotic course. If we are unable to obtain a specimen and he is doing well he will be dispositioned to home. The patient's care has been signed out to Dr. Shaune Pollack who will reassess the patient once he is up and moving around. It appears as though he did have some tachypnea on arrival but on exam he  was not tachypneic. We will have the nurse reevaluate the patient's vital signs to ensure they're within normal limits. ____________________________________________   FINAL CLINICAL IMPRESSION(S) / ED DIAGNOSES  Final diagnoses:  Diarrhea of presumed infectious origin  Lightheadedness  Weakness      NEW MEDICATIONS STARTED DURING THIS VISIT:  New Prescriptions   No medications on file     Note:  This document was prepared using Dragon voice recognition software and may include unintentional dictation errors.    Rebecka Apley, MD 05/24/16 [phone]

## 2016-07-18 ENCOUNTER — Emergency Department
Admission: EM | Admit: 2016-07-18 | Discharge: 2016-07-18 | Disposition: A | Discharge status: Home / Self Care | Payer: Self-pay | Attending: Emergency Medicine | Admitting: Emergency Medicine

## 2016-07-18 DIAGNOSIS — B349 Viral infection, unspecified: Secondary | ICD-10-CM | POA: Insufficient documentation

## 2016-07-18 DIAGNOSIS — Z0000 Encounter for general adult medical examination without abnormal findings: Secondary | ICD-10-CM

## 2016-07-18 DIAGNOSIS — F17210 Nicotine dependence, cigarettes, uncomplicated: Secondary | ICD-10-CM | POA: Insufficient documentation

## 2016-07-18 DIAGNOSIS — I252 Old myocardial infarction: Secondary | ICD-10-CM | POA: Insufficient documentation

## 2016-07-18 HISTORY — DX: Hyperlipidemia, unspecified: E78.5

## 2016-07-18 HISTORY — DX: Acute myocardial infarction, unspecified: I21.9

## 2016-07-18 NOTE — ED Triage Notes (Signed)
Pt reports having a cold 4 days ago. Pt denies any current symptoms. Pt reports having a heart attack 2 months ago. Pt denies any cardiac symptoms. Pt states: "I just wanted a check-up, because I'm around other people and I had a cold a few days ago".

## 2016-07-18 NOTE — ED Provider Notes (Signed)
[facility]-EMERGENCY DEPARTMENT Provider Note   CSN: [id number] Arrival date & time: 07/18/16  1828     History   Chief Complaint Chief Complaint  Patient presents with  . Fatigue    HPI Paul Day is a 60 y.o. male.Presents to the emergency department for evaluation of cold symptoms. Patient states 2 weeks ago he developed cough cold and congestion. Today he states his symptoms have completely resolved and he is asymptomatic. He needs evaluation by healthcare provider before he can go to the homeless shelter. Patient denies any fevers, coughing, and congestion, rashes, chest pain, shortness of breath. He denies any dizziness weakness or fatigue. Patient states his cold symptoms resolved close to one week ago.  HPI  Past Medical History:  Diagnosis Date  . Hyperlipidemia   . Myocardial infarct (HCC)   . Rheumatoid arthritis(714.0)     Patient Active Problem List   Diagnosis Date Noted  . Tachycardia 02/13/2016  . Unstable angina (HCC) 02/11/2016  . CAP (community acquired pneumonia) 02/11/2016  . Severe sepsis (HCC) 02/11/2016  . Rheumatoid arthritis [facility])     Past Surgical History:  Procedure Laterality Date  . LEG SURGERY Right        Home Medications    Prior to Admission medications   Medication Sig Start Date End Date Taking? Authorizing Provider  atorvastatin (LIPITOR) 80 MG tablet Take 80 mg by mouth every evening. 05/13/16 06/12/16  Historical Provider, MD  lidocaine (XYLOCAINE) 5 % ointment Apply 1 application topically 3 (three) times daily. 05/13/16 05/13/17  Historical Provider, MD  methimazole (TAPAZOLE) 10 MG tablet Take 20 mg by mouth daily. 05/13/16 06/12/16  Historical Provider, MD  propranolol (INDERAL) 80 MG tablet Take 80 mg by mouth 3 (three) times daily. 05/13/16 06/12/16  Historical Provider, MD    Family History No family history on file.  Social History Social History  Substance Use Topics  . Smoking status: Current Every Day Smoker   Packs/day: 1.50    Types: Cigarettes  . Smokeless tobacco: Never Used  . Alcohol use No     Allergies   Norco [hydrocodone-acetaminophen]   Review of Systems Review of Systems  Constitutional: Negative.  Negative for activity change, appetite change, chills and fever.  HENT: Negative for congestion, ear pain, mouth sores, rhinorrhea, sinus pressure, sore throat and trouble swallowing.   Eyes: Negative for photophobia, pain and discharge.  Respiratory: Negative for cough, chest tightness and shortness of breath.   Cardiovascular: Negative for chest pain and leg swelling.  Gastrointestinal: Negative for abdominal distention, abdominal pain, diarrhea, nausea and vomiting.  Genitourinary: Negative for difficulty urinating and dysuria.  Musculoskeletal: Negative for arthralgias, back pain and gait problem.  Skin: Negative for color change and rash.  Neurological: Negative for dizziness and headaches.  Hematological: Negative for adenopathy.  Psychiatric/Behavioral: Negative for agitation and behavioral problems.     Physical Exam Updated Vital Signs BP 139/66 (BP Location: Right Arm)   Pulse 81   Temp 98.2 F (36.8 C) (Oral)   Resp 18   Ht 5\' 6"  (1.676 m)   Wt 59 kg   SpO2 94%   BMI 20.98 kg/m   Physical Exam  Constitutional: He appears well-developed and well-nourished.  HENT:  Head: Normocephalic and atraumatic.  Right Ear: External ear normal.  Left Ear: External ear normal.  Nose: Nose normal.  Mouth/Throat: Oropharynx is clear and moist. No oropharyngeal exudate.  Eyes: Conjunctivae and EOM are normal. Pupils are equal, round, and reactive to light.  Right eye exhibits no discharge. Left eye exhibits no discharge.  Neck: Normal range of motion. Neck supple.  Cardiovascular: Normal rate and regular rhythm.   No murmur heard. Pulmonary/Chest: Effort normal and breath sounds normal. No respiratory distress.  Abdominal: Soft. He exhibits no mass. There is no  tenderness. There is no guarding.  Musculoskeletal: He exhibits no edema.  Lymphadenopathy:    He has cervical adenopathy (minimal posterior cervical lymphadenopathy).  Neurological: He is alert.  Skin: Skin is warm and dry. No rash noted.  Psychiatric: He has a normal mood and affect. His behavior is normal. Judgment and thought content normal.  Nursing note and vitals reviewed.    ED Treatments / Results  Labs (all labs ordered are listed, but only abnormal results are displayed) Labs Reviewed - No data to display  EKG  EKG Interpretation None       Radiology No results found.  Procedures Procedures (including critical care time)  Medications Ordered in ED Medications - No data to display   Initial Impression / Assessment and Plan / ED Course  I have reviewed the triage vital signs and the nursing notes.  Pertinent labs & imaging results that were available during my care of the patient were reviewed by me and considered in my medical decision making (see chart for details).  Clinical Course    60 year old male, off cold symptoms 2 weeks ago symptoms have resolved completely. Physical exam is unremarkable except for mild posterior cervical lymphadenopathy. Vital signs are normal, afebrile. No signs of coughing congestion or runny nose on exam. Patient is educated on signs and symptoms to return to the emergency department for.  Final Clinical Impressions(s) / ED Diagnoses   Final diagnoses:  Viral illness  Well adult health check    New Prescriptions New Prescriptions   No medications on file     Evon Slack, PA-C 07/18/16 1932    Sharman Cheek, MD 07/18/16 [phone]

## 2016-07-18 NOTE — ED Triage Notes (Signed)
Pt states all he needs is a note stating that he is ok health wise to return to homeless shelter. NAD.

## 2016-07-18 NOTE — ED Notes (Signed)
Reviewed d/c instructions, follow-up care with pt. Pt verbalized understanding 

## 2016-08-19 ENCOUNTER — Encounter: Payer: Self-pay | Admitting: Emergency Medicine

## 2016-08-19 ENCOUNTER — Inpatient Hospital Stay: Payer: Self-pay

## 2016-08-19 ENCOUNTER — Inpatient Hospital Stay
Admission: EM | Admit: 2016-08-19 | Discharge: 2016-08-22 | DRG: 304 | Disposition: A | Discharge status: Home / Self Care | Payer: Self-pay | Attending: Internal Medicine | Admitting: Internal Medicine

## 2016-08-19 DIAGNOSIS — I10 Essential (primary) hypertension: Secondary | ICD-10-CM

## 2016-08-19 DIAGNOSIS — I160 Hypertensive urgency: Principal | ICD-10-CM | POA: Diagnosis present

## 2016-08-19 DIAGNOSIS — G9340 Encephalopathy, unspecified: Secondary | ICD-10-CM

## 2016-08-19 DIAGNOSIS — I252 Old myocardial infarction: Secondary | ICD-10-CM

## 2016-08-19 DIAGNOSIS — G9349 Other encephalopathy: Secondary | ICD-10-CM | POA: Diagnosis present

## 2016-08-19 DIAGNOSIS — E785 Hyperlipidemia, unspecified: Secondary | ICD-10-CM | POA: Diagnosis present

## 2016-08-19 DIAGNOSIS — F1193 Opioid use, unspecified with withdrawal: Secondary | ICD-10-CM

## 2016-08-19 DIAGNOSIS — M79662 Pain in left lower leg: Secondary | ICD-10-CM

## 2016-08-19 DIAGNOSIS — R52 Pain, unspecified: Secondary | ICD-10-CM

## 2016-08-19 DIAGNOSIS — F1123 Opioid dependence with withdrawal: Secondary | ICD-10-CM | POA: Diagnosis present

## 2016-08-19 DIAGNOSIS — F17210 Nicotine dependence, cigarettes, uncomplicated: Secondary | ICD-10-CM | POA: Diagnosis present

## 2016-08-19 DIAGNOSIS — F1410 Cocaine abuse, uncomplicated: Secondary | ICD-10-CM | POA: Diagnosis present

## 2016-08-19 DIAGNOSIS — F1110 Opioid abuse, uncomplicated: Secondary | ICD-10-CM

## 2016-08-19 DIAGNOSIS — R000 Tachycardia, unspecified: Secondary | ICD-10-CM

## 2016-08-19 DIAGNOSIS — E0590 Thyrotoxicosis, unspecified without thyrotoxic crisis or storm: Secondary | ICD-10-CM | POA: Diagnosis present

## 2016-08-19 DIAGNOSIS — M79605 Pain in left leg: Secondary | ICD-10-CM | POA: Diagnosis present

## 2016-08-19 DIAGNOSIS — R64 Cachexia: Secondary | ICD-10-CM | POA: Diagnosis present

## 2016-08-19 DIAGNOSIS — R262 Difficulty in walking, not elsewhere classified: Secondary | ICD-10-CM

## 2016-08-19 DIAGNOSIS — M069 Rheumatoid arthritis, unspecified: Secondary | ICD-10-CM | POA: Diagnosis present

## 2016-08-19 DIAGNOSIS — Z590 Homelessness: Secondary | ICD-10-CM

## 2016-08-19 DIAGNOSIS — Z79899 Other long term (current) drug therapy: Secondary | ICD-10-CM

## 2016-08-19 DIAGNOSIS — Z681 Body mass index (BMI) 19 or less, adult: Secondary | ICD-10-CM

## 2016-08-19 LAB — BLOOD GAS, ARTERIAL
Acid-Base Excess: 1.7 mmol/L (ref 0.0–2.0)
Bicarbonate: 24.6 mmol/L (ref 20.0–28.0)
FIO2: 0.21
O2 Saturation: 98.3 %
PH ART: 7.48 — AB (ref 7.350–7.450)
Patient temperature: 37
pCO2 arterial: 33 mmHg (ref 32.0–48.0)
pO2, Arterial: 103 mmHg (ref 83.0–108.0)

## 2016-08-19 LAB — TSH: TSH: 0.141 u[IU]/mL — AB (ref 0.350–4.500)

## 2016-08-19 LAB — COMPREHENSIVE METABOLIC PANEL
ALBUMIN: 4.1 g/dL (ref 3.5–5.0)
ALT: 27 U/L (ref 17–63)
AST: 35 U/L (ref 15–41)
Alkaline Phosphatase: 173 U/L — ABNORMAL HIGH (ref 38–126)
Anion gap: 10 (ref 5–15)
BILIRUBIN TOTAL: 0.6 mg/dL (ref 0.3–1.2)
BUN: 14 mg/dL (ref 6–20)
CO2: 26 mmol/L (ref 22–32)
Calcium: 9.8 mg/dL (ref 8.9–10.3)
Chloride: 101 mmol/L (ref 101–111)
Creatinine, Ser: 0.56 mg/dL — ABNORMAL LOW (ref 0.61–1.24)
GFR calc Af Amer: >60 mL/min (ref >60)
GFR calc non Af Amer: >60 mL/min (ref >60)
GLUCOSE: 91 mg/dL (ref 65–99)
POTASSIUM: 3.6 mmol/L (ref 3.5–5.1)
Sodium: 137 mmol/L (ref 135–145)
TOTAL PROTEIN: 8.2 g/dL — AB (ref 6.5–8.1)

## 2016-08-19 LAB — CBC WITH DIFFERENTIAL/PLATELET
BASOS PCT: 1 %
Basophils Absolute: 0 10*3/uL (ref 0–0.1)
Eosinophils Absolute: 0 10*3/uL (ref 0–0.7)
Eosinophils Relative: 1 %
HEMATOCRIT: 43.6 % (ref 40.0–52.0)
Hemoglobin: 14.9 g/dL (ref 13.0–18.0)
LYMPHS ABS: 0.9 10*3/uL — AB (ref 1.0–3.6)
LYMPHS PCT: 13 %
MCH: 27 pg (ref 26.0–34.0)
MCHC: 34.3 g/dL (ref 32.0–36.0)
MCV: 78.7 fL — AB (ref 80.0–100.0)
MONO ABS: 0.4 10*3/uL (ref 0.2–1.0)
MONOS PCT: 6 %
NEUTROS ABS: 5.4 10*3/uL (ref 1.4–6.5)
Neutrophils Relative %: 79 %
Platelets: 169 10*3/uL (ref 150–440)
RBC: 5.53 MIL/uL (ref 4.40–5.90)
RDW: 14.6 % — AB (ref 11.5–14.5)
WBC: 6.8 10*3/uL (ref 3.8–10.6)

## 2016-08-19 LAB — URINALYSIS COMPLETE WITH MICROSCOPIC (ARMC ONLY)
BACTERIA UA: NONE SEEN
BILIRUBIN URINE: NEGATIVE
Glucose, UA: NEGATIVE mg/dL
Hgb urine dipstick: NEGATIVE
LEUKOCYTES UA: NEGATIVE
Nitrite: NEGATIVE
PH: 7 (ref 5.0–8.0)
Protein, ur: NEGATIVE mg/dL
SQUAMOUS EPITHELIAL / LPF: NONE SEEN
Specific Gravity, Urine: 1.01 (ref 1.005–1.030)

## 2016-08-19 LAB — URINE DRUG SCREEN, QUALITATIVE (ARMC ONLY)
AMPHETAMINES, UR SCREEN: NOT DETECTED
Barbiturates, Ur Screen: NOT DETECTED
Benzodiazepine, Ur Scrn: NOT DETECTED
COCAINE METABOLITE, UR ~~LOC~~: NOT DETECTED
Cannabinoid 50 Ng, Ur ~~LOC~~: NOT DETECTED
MDMA (Ecstasy)Ur Screen: NOT DETECTED
METHADONE SCREEN, URINE: NOT DETECTED
OPIATE, UR SCREEN: POSITIVE — AB
Phencyclidine (PCP) Ur S: NOT DETECTED
Tricyclic, Ur Screen: NOT DETECTED

## 2016-08-19 LAB — MAGNESIUM: Magnesium: 1.8 mg/dL (ref 1.7–2.4)

## 2016-08-19 LAB — TROPONIN I
Troponin I: <0.03 ng/mL (ref <0.03)
Troponin I: <0.03 ng/mL (ref <0.03)
Troponin I: <0.03 ng/mL (ref <0.03)

## 2016-08-19 LAB — ETHANOL: Alcohol, Ethyl (B): <5 mg/dL (ref <5)

## 2016-08-19 LAB — CK: Total CK: 108 U/L (ref 49–397)

## 2016-08-19 MED ORDER — ENOXAPARIN SODIUM 40 MG/0.4ML ~~LOC~~ SOLN
40.0000 mg | SUBCUTANEOUS | Status: DC
  Administered 2016-08-19 – 2016-08-21 (×3): 40 mg via SUBCUTANEOUS
  Filled 2016-08-19 (×3): qty 0.4

## 2016-08-19 MED ORDER — THIAMINE HCL 100 MG/ML IJ SOLN
100.0000 mg | Freq: Once | INTRAMUSCULAR | Status: AC
  Administered 2016-08-19: 100 mg via INTRAVENOUS
  Filled 2016-08-19: qty 2

## 2016-08-19 MED ORDER — LIDOCAINE 5 % EX OINT
1.0000 "application " | TOPICAL_OINTMENT | Freq: Three times a day (TID) | CUTANEOUS | Status: DC
  Administered 2016-08-20 – 2016-08-21 (×4): 1 via TOPICAL
  Filled 2016-08-19: qty 35.44

## 2016-08-19 MED ORDER — PROPRANOLOL HCL 40 MG PO TABS
80.0000 mg | ORAL_TABLET | Freq: Three times a day (TID) | ORAL | Status: DC
  Administered 2016-08-20 – 2016-08-22 (×7): 80 mg via ORAL
  Filled 2016-08-19 (×7): qty 2

## 2016-08-19 MED ORDER — SODIUM CHLORIDE 0.9% FLUSH
3.0000 mL | Freq: Two times a day (BID) | INTRAVENOUS | Status: DC
  Administered 2016-08-19 – 2016-08-22 (×7): 3 mL via INTRAVENOUS

## 2016-08-19 MED ORDER — LORAZEPAM 2 MG/ML IJ SOLN
2.0000 mg | INTRAMUSCULAR | Status: DC | PRN
  Administered 2016-08-19 (×5): 2 mg via INTRAVENOUS
  Filled 2016-08-19 (×5): qty 1

## 2016-08-19 MED ORDER — PROMETHAZINE HCL 25 MG/ML IJ SOLN
12.5000 mg | Freq: Four times a day (QID) | INTRAMUSCULAR | Status: DC | PRN
  Administered 2016-08-19 – 2016-08-20 (×3): 12.5 mg via INTRAVENOUS
  Filled 2016-08-19 (×4): qty 1

## 2016-08-19 MED ORDER — ENALAPRILAT 1.25 MG/ML IV SOLN
1.2500 mg | Freq: Four times a day (QID) | INTRAVENOUS | Status: DC
  Administered 2016-08-19 (×2): 1.25 mg via INTRAVENOUS
  Filled 2016-08-19 (×2): qty 2

## 2016-08-19 MED ORDER — DEXMEDETOMIDINE HCL IN NACL 400 MCG/100ML IV SOLN
0.4000 ug/kg/h | INTRAVENOUS | Status: DC
  Administered 2016-08-19 – 2016-08-20 (×2): 0.4 ug/kg/h via INTRAVENOUS
  Filled 2016-08-19: qty 100
  Filled 2016-08-19 (×2): qty 50

## 2016-08-19 MED ORDER — SODIUM CHLORIDE 0.9 % IV SOLN
1.0000 mg | Freq: Once | INTRAVENOUS | Status: AC
  Administered 2016-08-19: 1 mg via INTRAVENOUS
  Filled 2016-08-19: qty 0.2

## 2016-08-19 MED ORDER — LABETALOL HCL 5 MG/ML IV SOLN
10.0000 mg | INTRAVENOUS | Status: DC | PRN
  Filled 2016-08-19: qty 4

## 2016-08-19 MED ORDER — SODIUM CHLORIDE 0.9 % IV BOLUS (SEPSIS)
1000.0000 mL | Freq: Once | INTRAVENOUS | Status: AC
  Administered 2016-08-19: 1000 mL via INTRAVENOUS

## 2016-08-19 MED ORDER — NITROGLYCERIN 2 % TD OINT
1.0000 [in_us] | TOPICAL_OINTMENT | Freq: Four times a day (QID) | TRANSDERMAL | Status: DC
  Administered 2016-08-19 (×3): 1 [in_us] via TOPICAL
  Filled 2016-08-19 (×4): qty 1

## 2016-08-19 MED ORDER — ONDANSETRON HCL 4 MG PO TABS: 4.0000 mg | ORAL_TABLET | Freq: Four times a day (QID) | ORAL | Status: DC | PRN

## 2016-08-19 MED ORDER — LORAZEPAM 2 MG/ML IJ SOLN
1.0000 mg | Freq: Once | INTRAMUSCULAR | Status: AC
  Administered 2016-08-19: 1 mg via INTRAVENOUS
  Filled 2016-08-19: qty 1

## 2016-08-19 MED ORDER — ENALAPRILAT 1.25 MG/ML IV SOLN
1.2500 mg | Freq: Once | INTRAVENOUS | Status: AC
  Administered 2016-08-19: 1.25 mg via INTRAVENOUS
  Filled 2016-08-19: qty 2

## 2016-08-19 MED ORDER — KCL IN DEXTROSE-NACL 20-5-0.45 MEQ/L-%-% IV SOLN
INTRAVENOUS | Status: DC
  Administered 2016-08-19 – 2016-08-21 (×5): via INTRAVENOUS
  Filled 2016-08-19 (×6): qty 1000

## 2016-08-19 MED ORDER — ATORVASTATIN CALCIUM 20 MG PO TABS
80.0000 mg | ORAL_TABLET | Freq: Every evening | ORAL | Status: DC
  Administered 2016-08-20 – 2016-08-21 (×2): 80 mg via ORAL
  Filled 2016-08-19 (×3): qty 4

## 2016-08-19 MED ORDER — METHIMAZOLE 5 MG PO TABS
20.0000 mg | ORAL_TABLET | Freq: Every day | ORAL | Status: DC
  Administered 2016-08-20 – 2016-08-22 (×3): 20 mg via ORAL
  Filled 2016-08-19: qty 2
  Filled 2016-08-19 (×2): qty 4

## 2016-08-19 MED ORDER — ONDANSETRON HCL 4 MG/2ML IJ SOLN: 4.0000 mg | Freq: Four times a day (QID) | INTRAMUSCULAR | Status: DC | PRN

## 2016-08-19 NOTE — ED Notes (Signed)
Patient transported to CT 

## 2016-08-19 NOTE — ED Notes (Signed)
Pt changed and cleaned before transfer to CCU

## 2016-08-19 NOTE — ED Notes (Signed)
EDP at bedside  

## 2016-08-19 NOTE — ED Notes (Signed)
Pt resting in bed, resp even and unlabored, eyes closed 

## 2016-08-19 NOTE — Plan of Care (Signed)
Problem: Safety: Goal: Ability to remain free from injury will improve Outcome: Not Met (add Reason) Patient is incoherent Air cabin crew at bedside

## 2016-08-19 NOTE — H&P (Signed)
[facility] [place] at [facility]   PATIENT NAME: Paul Day    MR#:  [id number]  DATE OF BIRTH:  [birth year]  DATE OF ADMISSION:  08/19/2016  PRIMARY CARE PHYSICIAN: Default, Provider, MD   REQUESTING/REFERRING PHYSICIAN:   CHIEF COMPLAINT:   Chief Complaint  Patient presents with  . Addiction Problem    HISTORY OF PRESENT ILLNESS: Jordi Lacko  is a 60 y.o. male with a known history of Heroin and cocaine abuse, last used heroin/cocaine last night, comes to the hospital with complaints of withdrawal from heroin. He came in with whole body cramps, feeling anxious, he denied alcohol abuse. Patient is homeless. He denied suicidal or homicidal ideation. In emergency room. He was tachycardic and hypotensive, received Ativan, now asleep, unable to review systems. Hospitalist services were contacted this patient remains tachycardic and severely hypertensive. Urine drug screen is pending. EKG revealed biatrial enlargement, LVH, nonspecific T abnormalities in anterolateral leads. Patient denies any chest pain today, but admits of intermittent chest pains in the past, not able to provide more information. Complains of bilateral lower extremity pain     PAST MEDICAL HISTORY:   Past Medical History:  Diagnosis Date  . Hyperlipidemia   . Myocardial infarct (HCC)   . Rheumatoid arthritis(714.0)     PAST SURGICAL HISTORY: Past Surgical History:  Procedure Laterality Date  . LEG SURGERY Right     SOCIAL HISTORY:  Social History  Substance Use Topics  . Smoking status: Current Every Day Smoker    Packs/day: 1.50    Types: Cigarettes  . Smokeless tobacco: Never Used  . Alcohol use No    FAMILY HISTORY: History reviewed. No pertinent family history.  DRUG ALLERGIES:  Allergies  Allergen Reactions  . Norco [Hydrocodone-Acetaminophen] Itching and Rash    Review of Systems  Unable to perform ROS: Mental acuity    MEDICATIONS AT HOME:  Prior to  Admission medications   Medication Sig Start Date End Date Taking? Authorizing Provider  atorvastatin (LIPITOR) 80 MG tablet Take 80 mg by mouth every evening. 05/13/16 06/12/16  Historical Provider, MD  lidocaine (XYLOCAINE) 5 % ointment Apply 1 application topically 3 (three) times daily. 05/13/16 05/13/17  Historical Provider, MD  methimazole (TAPAZOLE) 10 MG tablet Take 20 mg by mouth daily. 05/13/16 06/12/16  Historical Provider, MD  propranolol (INDERAL) 80 MG tablet Take 80 mg by mouth 3 (three) times daily. 05/13/16 06/12/16  Historical Provider, MD      PHYSICAL EXAMINATION:   VITAL SIGNS: Blood pressure (!) 180/97, pulse (!) 119, temperature 97.8 F (36.6 C), temperature source Oral, resp. rate (!) 25, height 5\' 6"  (1.676 m), weight 45.4 kg (100 lb), SpO2 98 %.  GENERAL:  60 y.o.-year-old patient lying in the bed with no acute distress, Somnolent, barely able to open eyes, mumbles trying answering questions, understandably.  EYES: Pupils equal, round, reactive to light and accommodation. No scleral icterus. Extraocular muscles intact.  HEENT: Head atraumatic, normocephalic. Oropharynx and nasopharynx clear.  NECK:  Supple, no jugular venous distention. No thyroid enlargement, no tenderness.  LUNGS: Some diminished breath sounds bilaterally, no wheezing, rales,rhonchi or crepitation. No use of accessory muscles of respiration.  CARDIOVASCULAR: S1, S2 , tachycardic. No murmurs, rubs, or gallops.  ABDOMEN: Soft, nontender, nondistended. Bowel sounds present. No organomegaly or mass.  EXTREMITIES: No pedal edema, cyanosis, or clubbing.  NEUROLOGIC: Cranial nerves II through XII are intact. Muscle strength 5/5 in all extremities. Sensation intact. Gait not checked.  PSYCHIATRIC: The  patient is very somnolent, barely to awaken, however, opens eyes, mumbles by himself, does not follow commands, unable to assess orientation.  SKIN: No obvious rash, lesion, or ulcer.   LABORATORY PANEL:    CBC  Recent Labs Lab 08/19/16 0844  WBC 6.8  HGB 14.9  HCT 43.6  PLT 169  MCV 78.7*  MCH 27.0  MCHC 34.3  RDW 14.6*  LYMPHSABS 0.9*  MONOABS 0.4  EOSABS 0.0  BASOSABS 0.0   ------------------------------------------------------------------------------------------------------------------  Chemistries   Recent Labs Lab 08/19/16 0844  NA 137  K 3.6  CL 101  CO2 26  GLUCOSE 91  BUN 14  CREATININE 0.56*  CALCIUM 9.8  MG 1.8  AST 35  ALT 27  ALKPHOS 173*  BILITOT 0.6   ------------------------------------------------------------------------------------------------------------------  Cardiac Enzymes No results for input(s): TROPONINI in the last 168 hours. ------------------------------------------------------------------------------------------------------------------  RADIOLOGY: No results found.  EKG: Orders placed or performed during the hospital encounter of 08/19/16  . EKG 12-Lead  . EKG 12-Lead  . EKG 12-Lead  . EKG 12-Lead   EKG in emergency room revealed sinus tachycardia at rate of 121 bpm, left atrial enlargement, consider biatrial enlargement, left ventricular hypertrophy, nonspecific T wave abnormality in anterolateral leads.    IMPRESSION AND PLAN:  Principal Problem:   Malignant essential hypertension Active Problems:   Sinus tachycardia (HCC) #1. Malignant essential hypertension, likely due to cocaine abuse, heroin withdrawal , continue Ativan, supportive therapy, initiate ACE inhibitor, give 1 dose intravenously, discontinue beta blockers if urine drug screen shows cocaine. Initiate nitroglycerin topically .  #2. Sinus tachycardia, again due to cocaine likely, Supportive therapy, get TSH, Urine drug screen, IV fluids. Get cardiac enzymes 3 #3. Encephalopathy, acute, possibly related to lorazepam given in the emergency room, follow closely, get a head CT #4. Lower extremity pain, unclear etiology, magnesium level was checked, 1.8,  borderline, supportive therapy, peripheral perfusion is satisfactory, pulses are well palpable. Get CK level to rule out rhabdomyolysis.   All the records are reviewed and case discussed with ED provider. Management plans discussed with the patient, family and they are in agreement.  CODE STATUS: Code Status History    Date Active Date Inactive Code Status Order ID Comments User Context   02/11/2016 10:37 PM 02/13/2016  6:13 PM Full Code [id number]  Oralia Manis, MD Inpatient       TOTAL Critical care TIME TAKING CARE OF THIS PATIENT: 50  minutes.    Katharina Caper M.D on 08/19/2016 at 1:06 PM  Between 7am to 6pm - Pager - [phone] After 6pm go to [email or web address] - password [email or web address] [facility]  [facility]  Office  [phone]  CC: Primary care physician; Default, Provider, MD

## 2016-08-19 NOTE — ED Triage Notes (Signed)
Patient states that he is addicted to heroin and the last time that he took heroin was last night. His complaint is that he is now widthdrawing from heroin and needs to detox.

## 2016-08-19 NOTE — ED Notes (Signed)
Patient had diarrhea in the bed. Patient called out to be cleaned up. Tech crystal helped clean patient up

## 2016-08-19 NOTE — ED Provider Notes (Addendum)
[facility] Emergency Department Provider Note  ____________________________________________   I have reviewed the triage vital signs and the nursing notes.   HISTORY  Chief Complaint Addiction Problem    HPI Paul Day is a 60 y.o. male presents today complaining of being in withdrawal from heroin. His been using heroin for 4 years. Denies IV heroin abuse. Denies any IV drug abuse. States he also uses cocaine. Last cocaine was yesterday. Last heroin use yesterday. He only snorts heroin he states. He feels whole body cramps and anxious because he stopped using it. He wants to stop forever he states.He specifically and emphatically denies using any alcohol. He is not a drinker.  Patient is homeless. He has no SI or HI. He has no other complaints. Did use cocaine this morning before coming in he states. Also has a history of "tachycardia" as a diagnosis.  Past Medical History:  Diagnosis Date  . Hyperlipidemia   . Myocardial infarct (HCC)   . Rheumatoid arthritis(714.0)     Patient Active Problem List   Diagnosis Date Noted  . Tachycardia 02/13/2016  . Unstable angina (HCC) 02/11/2016  . CAP (community acquired pneumonia) 02/11/2016  . Severe sepsis (HCC) 02/11/2016  . Rheumatoid arthritis [facility])     Past Surgical History:  Procedure Laterality Date  . LEG SURGERY Right     Prior to Admission medications   Medication Sig Start Date End Date Taking? Authorizing Provider  atorvastatin (LIPITOR) 80 MG tablet Take 80 mg by mouth every evening. 05/13/16 06/12/16  Historical Provider, MD  lidocaine (XYLOCAINE) 5 % ointment Apply 1 application topically 3 (three) times daily. 05/13/16 05/13/17  Historical Provider, MD  methimazole (TAPAZOLE) 10 MG tablet Take 20 mg by mouth daily. 05/13/16 06/12/16  Historical Provider, MD  propranolol (INDERAL) 80 MG tablet Take 80 mg by mouth 3 (three) times daily. 05/13/16 06/12/16  Historical Provider, MD     Allergies Norco [hydrocodone-acetaminophen]  History reviewed. No pertinent family history.  Social History Social History  Substance Use Topics  . Smoking status: Current Every Day Smoker    Packs/day: 1.50    Types: Cigarettes  . Smokeless tobacco: Never Used  . Alcohol use No    Review of Systems Constitutional: No fever/chills Eyes: No visual changes. ENT: No sore throat. No stiff neck no neck pain Cardiovascular: Denies chest pain. Respiratory: Denies shortness of breath. Gastrointestinal:   no vomiting.  No diarrhea.  No constipation. Genitourinary: Negative for dysuria. Musculoskeletal: Negative lower extremity swelling Skin: Negative for rash. Neurological: Negative for severe headaches, focal weakness or numbness. 10-point ROS otherwise negative.  ____________________________________________   PHYSICAL EXAM:  VITAL SIGNS: ED Triage Vitals  Enc Vitals Group     BP 08/19/16 0811 (!) 143/89     Pulse Rate 08/19/16 0811 (!) 118     Resp 08/19/16 0811 20     Temp 08/19/16 0811 97.8 F (36.6 C)     Temp Source 08/19/16 0811 Oral     SpO2 08/19/16 0811 98 %     Weight 08/19/16 0812 100 lb (45.4 kg)     Height 08/19/16 0812 5\' 6"  (1.676 m)     Head Circumference --      Peak Flow --      Pain Score 08/19/16 0812 8     Pain Loc --      Pain Edu? --      Excl. in GC? --     Constitutional: Alert and oriented.Chronically ill  and unkempt appearing, very low BMI, however in no acute medical distress Eyes: Conjunctivae are normal. PERRL. EOMI. Head: Atraumatic. Nose: No congestion/rhinnorhea. Mouth/Throat: Mucous membranes are moist.  Oropharynx non-erythematous. Neck: No stridor.   Nontender with no meningismus Cardiovascular: Mild tachycardia noted, regular rhythm. Grossly normal heart sounds.  Good peripheral circulation. Respiratory: Normal respiratory effort.  No retractions. Lungs CTAB. Abdominal: Soft and nontender. No distention. No guarding no  rebound Back:  There is no focal tenderness or step off.  there is no midline tenderness there are no lesions noted. there is no CVA tenderness Musculoskeletal: No lower extremity tenderness, no upper extremity tenderness. No joint effusions, no DVT signs strong distal pulses no edema Neurologic:  Normal speech and language. No gross focal neurologic deficits are appreciated.  Skin:  Skin is warm, dry and intact. No rash noted. Psychiatric: Mood and affect are normal. Speech and behavior are normal.  ____________________________________________   LABS (all labs ordered are listed, but only abnormal results are displayed)  Labs Reviewed  COMPREHENSIVE METABOLIC PANEL - Abnormal; Notable for the following:       Result Value   Creatinine, Ser 0.56 (*)    Total Protein 8.2 (*)    Alkaline Phosphatase 173 (*)    All other components within normal limits  CBC WITH DIFFERENTIAL/PLATELET - Abnormal; Notable for the following:    MCV 78.7 (*)    RDW 14.6 (*)    Lymphs Abs 0.9 (*)    All other components within normal limits  MAGNESIUM  URINE DRUG SCREEN, QUALITATIVE ([facility] ONLY)  URINALYSIS COMPLETEWITH MICROSCOPIC ([facility] ONLY)   ____________________________________________  EKG  I personally interpreted any EKGs ordered by me or triage Sinus tach rate 121 acute ST elevation or depression, LVH noted, nonspecific ST changes noted ____________________________________________  RADIOLOGY  I reviewed any imaging ordered by me or triage that were performed during my shift and, if possible, patient and/or family made aware of any abnormal findings. ____________________________________________   PROCEDURES  Procedure(s) performed: None  Procedures  Critical Care performed: None  ____________________________________________   INITIAL IMPRESSION / ASSESSMENT AND PLAN / ED COURSE  Pertinent labs & imaging results that were available during my care of the patient were reviewed by me  and considered in my medical decision making (see chart for details).  Patient presents complaining of diffuse body cramps in the context of not taking his usual amount of heroin. We will give him IV fluids, check magnesium and electrolytes, give him some Ativan and some nausea medication and see if the counselors can help him with his problem at this time.  ----------------------------------------- 12:32 PM on 08/19/2016 -----------------------------------------  Patient remains awake and alert radiated sleep briefly after Ativan and his heart rate came down but then has gone back up. Concern for other kinds of withdrawal. I have given him 2 L of fluid and he still is mildly tachycardic and his blood pressures up. We'll give him more Ativan. This could possibly be alcohol or other withdrawal syndrome and given that I am not able to get his heart rate down with IV fluids and Ativan we will admit.  Clinical Course   ____________________________________________   FINAL CLINICAL IMPRESSION(S) / ED DIAGNOSES  Final diagnoses:  None      This chart was dictated using voice recognition software.  Despite best efforts to proofread,  errors can occur which can change meaning.      Jeanmarie PlantJames A Maydell Knoebel, MD 08/19/16 [id number]    Jeanmarie PlantJames A Glenard Keesling,  MD 08/19/16 [id number]    Jeanmarie Plant, MD 08/19/16 1048    Jeanmarie Plant, MD 08/19/16 [phone]

## 2016-08-19 NOTE — BH Assessment (Addendum)
Assessment Note  Paul Day is an 60 y.o. male who presents to the ER seeking assistance for heroin detox. Patient states he started using at the age of 1. He's using on a daily basis and it's in the amount of "Three bags."  His current symptoms of withdrawal are; cold chills, diarrhea, runny nose and shakes.  He have no history of seizures or blackouts.  Patient denies the use of any other mind altering substances.  He denies current involvement with the legal system and with DSS. He have no history of aggression or violence.  Patient denies SI/HI and AV/H.   Diagnosis: Opioid Use, Disorder  Past Medical History:  Past Medical History:  Diagnosis Date  . Hyperlipidemia   . Myocardial infarct (HCC)   . Rheumatoid arthritis(714.0)     Past Surgical History:  Procedure Laterality Date  . LEG SURGERY Right     Family History: History reviewed. No pertinent family history.  Social History:  reports that he has been smoking Cigarettes.  He has been smoking about 1.50 packs per day. He has never used smokeless tobacco. He reports that he uses drugs, including Cocaine and Marijuana. He reports that he does not drink alcohol.  Additional Social History:  Alcohol / Drug Use Pain Medications: See PTA Prescriptions: See PTA Over the Counter: See PTA History of alcohol / drug use?: Yes Longest period of sobriety (when/how long): Unable to quantify Withdrawal Symptoms: Diarrhea, Nausea / Vomiting, Weakness Substance #1 Name of Substance 1: Heroin 1 - Age of First Use: 54 1 - Amount (size/oz): "Like three bags." 1 - Frequency: Daily 1 - Duration: Unable to quantify  1 - Last Use / Amount: 08/18/2016  CIWA: CIWA-Ar BP: (!) 181/97 Pulse Rate: (!) 114 Nausea and Vomiting: mild nausea with no vomiting Tactile Disturbances: none Tremor: no tremor Auditory Disturbances: not present Paroxysmal Sweats: no sweat visible Visual Disturbances: not present Anxiety: no anxiety, at  ease Headache, Fullness in Head: none present Agitation: normal activity Orientation and Clouding of Sensorium: oriented and can do serial additions CIWA-Ar Total: 1 COWS:    Allergies:  Allergies  Allergen Reactions  . Norco [Hydrocodone-Acetaminophen] Itching and Rash    Home Medications:  (Not in a hospital admission)  OB/GYN Status:  No LMP for male patient.  General Assessment Data Location of Assessment: [facility] ED TTS Assessment: In system Is this a Tele or Face-to-Face Assessment?: Face-to-Face Is this an Initial Assessment or a Re-assessment for this encounter?: Initial Assessment Marital status: Single Maiden name: n/a Is patient pregnant?: No Pregnancy Status: No Living Arrangements: Parent (Mother) Can pt return to current living arrangement?: Yes Admission Status: Voluntary Is patient capable of signing voluntary admission?: Yes Referral Source: Self/Family/Friend Insurance type: None  Medical Screening Exam [facility] Walk-in ONLY) Medical Exam completed: Yes  Crisis Care Plan Living Arrangements: Parent (Mother) Legal Guardian: Other: (Reports of none) Name of Psychiatrist: Reports of none Name of Therapist: Reports of none  Education Status Is patient currently in school?: No Current Grade: n/a Highest grade of school patient has completed: 12th Grade Name of school: n/a Contact person: n/a  Risk to self with the past 6 months Suicidal Ideation: No Has patient been a risk to self within the past 6 months prior to admission? : No Suicidal Intent: No Has patient had any suicidal intent within the past 6 months prior to admission? : No Is patient at risk for suicide?: No Suicidal Plan?: No Has patient had any suicidal plan  within the past 6 months prior to admission? : No Access to Means: No What has been your use of drugs/alcohol within the last 12 months?: Heroin Previous Attempts/Gestures: No How many times?: 0 Other Self Harm Risks: Active  Addiction Triggers for Past Attempts: None known Intentional Self Injurious Behavior: None Family Suicide History: No Recent stressful life event(s): Other (Comment) (Active Addiction ) Persecutory voices/beliefs?: No Depression: Yes Depression Symptoms: Guilt, Feeling worthless/self pity Substance abuse history and/or treatment for substance abuse?: No Suicide prevention information given to non-admitted patients: Not applicable  Risk to Others within the past 6 months Homicidal Ideation: No Does patient have any lifetime risk of violence toward others beyond the six months prior to admission? : No Thoughts of Harm to Others: No Current Homicidal Intent: No Current Homicidal Plan: No Access to Homicidal Means: No Identified Victim: Reports of none History of harm to others?: No Assessment of Violence: None Noted Violent Behavior Description: Reports of none Does patient have access to weapons?: No Criminal Charges Pending?: No Does patient have a court date: No Is patient on probation?: No  Psychosis Hallucinations: None noted Delusions: None noted  Mental Status Report Appearance/Hygiene: Disheveled Eye Contact: Poor Motor Activity: Unable to assess (Patient laying in the bed) Speech: Logical/coherent, Soft, Slurred Level of Consciousness: Alert Mood: Anxious, Sad Affect: Appropriate to circumstance, Sad Anxiety Level: None Thought Processes: Coherent, Relevant Judgement: Partial Orientation: Person, Place, Time, Situation, Appropriate for developmental age Obsessive Compulsive Thoughts/Behaviors: Minimal  Cognitive Functioning Concentration: Decreased Memory: Recent Intact, Remote Intact IQ: Average Insight: Fair Impulse Control: Poor Appetite: Fair Weight Loss: 0 Weight Gain: 0 Sleep: No Change Total Hours of Sleep: 0 Vegetative Symptoms: None  ADLScreening [facility] Assessment Services) Patient's cognitive ability adequate to safely complete daily  activities?: Yes Patient able to express need for assistance with ADLs?: Yes Independently performs ADLs?: Yes (appropriate for developmental age)  Prior Inpatient Therapy Prior Inpatient Therapy: No Prior Therapy Dates: Reports of none Prior Therapy Facilty/Provider(s): Reports of none Reason for Treatment: Reports of none  Prior Outpatient Therapy Prior Outpatient Therapy: No Prior Therapy Dates: Reports of none Prior Therapy Facilty/Provider(s): Reports of none Reason for Treatment: Reports of none Does patient have an ACCT team?: No Does patient have Intensive In-House Services?  : No Does patient have [facility] services? : No Does patient have P4CC services?: No  ADL Screening (condition at time of admission) Patient's cognitive ability adequate to safely complete daily activities?: Yes Is the patient deaf or have difficulty hearing?: No Does the patient have difficulty seeing, even when wearing glasses/contacts?: No Does the patient have difficulty concentrating, remembering, or making decisions?: No Patient able to express need for assistance with ADLs?: Yes Does the patient have difficulty dressing or bathing?: No Independently performs ADLs?: Yes (appropriate for developmental age) Does the patient have difficulty walking or climbing stairs?: No Weakness of Legs: None Weakness of Arms/Hands: None  Home Assistive Devices/Equipment Home Assistive Devices/Equipment: None  Therapy Consults (therapy consults require a physician order) PT Evaluation Needed: No OT Evalulation Needed: No SLP Evaluation Needed: No Abuse/Neglect Assessment (Assessment to be complete while patient is alone) Physical Abuse: Denies Verbal Abuse: Denies Sexual Abuse: Denies Exploitation of patient/patient's resources: Denies Self-Neglect: Denies Values / Beliefs Cultural Requests During Hospitalization: None Spiritual Requests During Hospitalization: None Consults Spiritual Care Consult  Needed: No Social Work Consult Needed: No Merchant navy officer (For Healthcare) Does patient have an advance directive?: No    Additional Information 1:1 In Past 12 Months?:  No CIRT Risk: No Elopement Risk: No Does patient have medical clearance?: Yes  Child/Adolescent Assessment Running Away Risk: Denies (Patient is an adult)  Disposition:  Disposition Initial Assessment Completed for this Encounter: Yes Disposition of Patient: Referred to Patient referred to: RTS  On Site Evaluation by:   Reviewed with Physician:    Lilyan Gilford MS, LCAS, LPC, NCC, CCSI Therapeutic Triage Specialist 08/19/2016 11:29 AM

## 2016-08-19 NOTE — ED Triage Notes (Signed)
Pt states he is having bilateral cramping in legs. N/V/D x 1.

## 2016-08-19 NOTE — ED Notes (Signed)
Pt given crackers and diet cola

## 2016-08-19 NOTE — ED Notes (Signed)
Pt stood up from bed and had diarrhea in the floor. Patient assisted to bathroom

## 2016-08-20 ENCOUNTER — Inpatient Hospital Stay: Payer: Self-pay

## 2016-08-20 DIAGNOSIS — F1110 Opioid abuse, uncomplicated: Secondary | ICD-10-CM

## 2016-08-20 DIAGNOSIS — F1193 Opioid use, unspecified with withdrawal: Secondary | ICD-10-CM

## 2016-08-20 DIAGNOSIS — F1123 Opioid dependence with withdrawal: Secondary | ICD-10-CM

## 2016-08-20 LAB — COMPREHENSIVE METABOLIC PANEL
ALBUMIN: 3.7 g/dL (ref 3.5–5.0)
ALT: 25 U/L (ref 17–63)
AST: 29 U/L (ref 15–41)
Alkaline Phosphatase: 149 U/L — ABNORMAL HIGH (ref 38–126)
Anion gap: 9 (ref 5–15)
BILIRUBIN TOTAL: 0.6 mg/dL (ref 0.3–1.2)
BUN: 14 mg/dL (ref 6–20)
CO2: 24 mmol/L (ref 22–32)
CREATININE: 1.03 mg/dL (ref 0.61–1.24)
Calcium: 9.3 mg/dL (ref 8.9–10.3)
Chloride: 106 mmol/L (ref 101–111)
GFR calc Af Amer: >60 mL/min (ref >60)
GFR calc non Af Amer: >60 mL/min (ref >60)
GLUCOSE: 143 mg/dL — AB (ref 65–99)
POTASSIUM: 3.2 mmol/L — AB (ref 3.5–5.1)
Sodium: 139 mmol/L (ref 135–145)
TOTAL PROTEIN: 7.3 g/dL (ref 6.5–8.1)

## 2016-08-20 LAB — CBC
HEMATOCRIT: 42.5 % (ref 40.0–52.0)
Hemoglobin: 14.6 g/dL (ref 13.0–18.0)
MCH: 26.8 pg (ref 26.0–34.0)
MCHC: 34.3 g/dL (ref 32.0–36.0)
MCV: 78.2 fL — AB (ref 80.0–100.0)
PLATELETS: 169 10*3/uL (ref 150–440)
RBC: 5.43 MIL/uL (ref 4.40–5.90)
RDW: 14.4 % (ref 11.5–14.5)
WBC: 6.8 10*3/uL (ref 3.8–10.6)

## 2016-08-20 LAB — MRSA PCR SCREENING: MRSA by PCR: POSITIVE — AB

## 2016-08-20 LAB — GLUCOSE, CAPILLARY: Glucose-Capillary: 101 mg/dL — ABNORMAL HIGH (ref 65–99)

## 2016-08-20 LAB — POTASSIUM: Potassium: 4.5 mmol/L (ref 3.5–5.1)

## 2016-08-20 LAB — TROPONIN I: Troponin I: <0.03 ng/mL (ref <0.03)

## 2016-08-20 MED ORDER — BUPRENORPHINE HCL 2 MG SL SUBL
4.0000 mg | SUBLINGUAL_TABLET | Freq: Two times a day (BID) | SUBLINGUAL | Status: DC
  Administered 2016-08-21 – 2016-08-22 (×3): 4 mg via SUBLINGUAL
  Filled 2016-08-20 (×4): qty 2

## 2016-08-20 MED ORDER — MUPIROCIN 2 % EX OINT
1.0000 "application " | TOPICAL_OINTMENT | Freq: Two times a day (BID) | CUTANEOUS | Status: DC
  Administered 2016-08-20 – 2016-08-22 (×5): 1 via NASAL
  Filled 2016-08-20: qty 22

## 2016-08-20 MED ORDER — BUPRENORPHINE HCL 8 MG SL SUBL
8.0000 mg | SUBLINGUAL_TABLET | SUBLINGUAL | Status: AC
Start: 2016-08-20 — End: 2016-08-20
  Administered 2016-08-20: 8 mg via SUBLINGUAL
  Filled 2016-08-20: qty 1

## 2016-08-20 MED ORDER — SODIUM CHLORIDE 0.9 % IV SOLN
Freq: Once | INTRAVENOUS | Status: AC
  Administered 2016-08-20: 03:00:00 via INTRAVENOUS

## 2016-08-20 MED ORDER — CHLORHEXIDINE GLUCONATE CLOTH 2 % EX PADS
6.0000 | MEDICATED_PAD | Freq: Every day | CUTANEOUS | Status: DC
  Administered 2016-08-20 – 2016-08-22 (×3): 6 via TOPICAL

## 2016-08-20 MED ORDER — POTASSIUM CHLORIDE 10 MEQ/100ML IV SOLN
10.0000 meq | INTRAVENOUS | Status: AC
  Administered 2016-08-20 (×4): 10 meq via INTRAVENOUS
  Filled 2016-08-20 (×4): qty 100

## 2016-08-20 NOTE — Progress Notes (Signed)
Patient ID: Paul Day, male   DOB: November 16, 1956, 60 y.o.   MRN: [id number]  [employer] PROGRESS NOTE  Paul Day WLN:[id number] DOB: 07-29-1956 DOA: 08/19/2016 PCP: Default, Provider, MD  HPI/Subjective: Patient seen earlier while on Precedex drip. He complained of left leg pain. He states he uses heroin. As per nursing staff did have nausea vomiting diarrhea last night. He became agitated last night and Precedex drip was started.  Objective: Vitals:   08/20/16 1430 08/20/16 1500  BP: 98/65 107/62  Pulse:  85  Resp:  (!) 22  Temp: 98.1 F (36.7 C)     Filed Weights   08/19/16 0812 08/19/16 1522  Weight: 45.4 kg (100 lb) 44.4 kg (97 lb 14.2 oz)    ROS: Review of Systems  Unable to perform ROS: Acuity of condition  Respiratory: Negative for cough and shortness of breath.   Cardiovascular: Negative for chest pain.  Gastrointestinal: Negative for abdominal pain.  Musculoskeletal: Positive for joint pain.   Exam: Physical Exam  Constitutional: He appears cachectic.  HENT:  Nose: No mucosal edema.  Mouth/Throat: No oropharyngeal exudate or posterior oropharyngeal edema.  Eyes: Conjunctivae, EOM and lids are normal. Pupils are equal, round, and reactive to light.  Neck: No JVD present. Carotid bruit is not present. No edema present. No thyroid mass and no thyromegaly present.  Cardiovascular: S1 normal and S2 normal.  Exam reveals no gallop.   No murmur heard. Pulses:      Dorsalis pedis pulses are 2+ on the right side, and 2+ on the left side.  Respiratory: No respiratory distress. He has no wheezes. He has no rhonchi. He has no rales.  GI: Soft. Bowel sounds are normal. There is no tenderness.  Musculoskeletal:       Right ankle: He exhibits no swelling.       Left ankle: He exhibits no swelling.  Lymphadenopathy:    He has no cervical adenopathy.  Neurological: He is alert.  Skin: Skin is warm. No rash noted. Nails show no clubbing.  Psychiatric: He has a  normal mood and affect.      Data Reviewed: Basic Metabolic Panel:  Recent Labs Lab 08/19/16 0844 08/20/16 0402  NA 137 139  K 3.6 3.2*  CL 101 106  CO2 26 24  GLUCOSE 91 143*  BUN 14 14  CREATININE 0.56* 1.03  CALCIUM 9.8 9.3  MG 1.8  --    Liver Function Tests:  Recent Labs Lab 08/19/16 0844 08/20/16 0402  AST 35 29  ALT 27 25  ALKPHOS 173* 149*  BILITOT 0.6 0.6  PROT 8.2* 7.3  ALBUMIN 4.1 3.7   CBC:  Recent Labs Lab 08/19/16 0844 08/20/16 0402  WBC 6.8 6.8  NEUTROABS 5.4  --   HGB 14.9 14.6  HCT 43.6 42.5  MCV 78.7* 78.2*  PLT 169 169   Cardiac Enzymes:  Recent Labs Lab 08/19/16 1325 08/19/16 1751 08/19/16 2136 08/20/16 0114  CKTOTAL 108  --   --   --   TROPONINI <0.03 <0.03 <0.03 <0.03    CBG:  Recent Labs Lab 08/19/16 1522  GLUCAP 101*    Recent Results (from the past 240 hour(s))  MRSA PCR Screening     Status: Abnormal   Collection Time: 08/19/16  3:29 PM  Result Value Ref Range Status   MRSA by PCR POSITIVE (A) NEGATIVE Final    Comment:        The GeneXpert MRSA Assay (FDA approved for NASAL  specimens only), is one component of a comprehensive MRSA colonization surveillance program. It is not intended to diagnose MRSA infection nor to guide or monitor treatment for MRSA infections. CRITICAL RESULT CALLED TO, READ BACK BY AND VERIFIED WITH: JAIME SMITH GREGORY@0900  ON 08/20/16 BY HKP      Studies: Ct Head Wo Contrast  Result Date: 08/19/2016 CLINICAL DATA:  60 year old male undergoing heroin withdrawal. Encephalopathy. Initial encounter. EXAM: CT HEAD WITHOUT CONTRAST TECHNIQUE: Contiguous axial images were obtained from the base of the skull through the vertex without intravenous contrast. COMPARISON:  None. FINDINGS: Study is intermittently degraded by motion artifact despite repeated imaging attempts. Brain: No midline shift, ventriculomegaly, mass effect, evidence of mass lesion, intracranial hemorrhage or evidence  of cortically based acute infarction. Gray-white matter differentiation is within normal limits throughout the brain. Vascular: Calcified atherosclerosis at the skull base. No suspicious intracranial vascular hyperdensity. Skull: No acute osseous abnormality identified. Sinuses/Orbits: Mild ethmoid and frontal sinus mucoperiosteal thickening. Tympanic cavities and mastoids are clear. Other: Negative orbit and scalp soft tissues. IMPRESSION: 1. Negative for age non contrast CT appearance of the brain. 2. Chronic appearing ethmoid and frontal sinusitis. Electronically Signed   By: Odessa Fleming M.D.   On: 08/19/2016 14:43   Dg Chest Port 1 View  Result Date: 08/20/2016 CLINICAL DATA:  Tachycardia. EXAM: PORTABLE CHEST 1 VIEW COMPARISON:  05/24/2016 FINDINGS: Mild hyperinflation. The heart size and mediastinal contours are within normal limits. Both lungs are clear. The visualized skeletal structures are unremarkable. IMPRESSION: No active disease. Electronically Signed   By: Burman Nieves M.D.   On: 08/20/2016 05:57    Scheduled Meds: . atorvastatin  80 mg Oral QPM  . Chlorhexidine Gluconate Cloth  6 each Topical Q0600  . enoxaparin (LOVENOX) injection  40 mg Subcutaneous Q24H  . lidocaine  1 application Topical TID  . methimazole  20 mg Oral Daily  . mupirocin ointment  1 application Nasal BID  . propranolol  80 mg Oral TID  . sodium chloride flush  3 mL Intravenous Q12H   Continuous Infusions: . dexmedetomidine Stopped (08/20/16 1322)  . dextrose 5 % and 0.45 % NaCl with KCl 20 mEq/L 100 mL/hr at 08/20/16 1400    Assessment/Plan:  1. Heroin withdrawal with nausea vomiting and diarrhea. Patient was placed on Precedex drip last night. Nurse was able to taper off the drip by this afternoon. Continue to monitor closely. Psych consult. 2. 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 Discontinue IV enalapril and nitroglycerin patch. Blood pressure too low at this point. 3. Acute encephalopathy secondary  to her room withdrawal. 4. Hyperthyroidism on methimazole and propranolol 5. Hyperlipidemia unspecified on atorvastatin 6. History of rheumatoid arthritis  Code Status:     Code Status Orders        Start     Ordered   08/19/16 1525  Full code  Continuous     08/19/16 1524    Code Status History    Date Active Date Inactive Code Status Order ID Comments User Context   02/11/2016 10:37 PM 02/13/2016  6:13 PM Full Code [id number]  Oralia Manis, MD Inpatient     Disposition Plan: To be determined  Consultants:  Psychiatry  Time spent:  25 minutes  Alford Highland  [employer]

## 2016-08-20 NOTE — Consult Note (Signed)
[facility] Face-to-Face Psychiatry Consult   Reason for Consult:  Consult for this 60 year old man who came to the emergency room initially requesting assistance with heroin detox and has now been admitted to the critical care unit. Referring Physician:  Leslye Peer Patient Identification: Paul Day MRN:  [id number] Principal Diagnosis: Opiate withdrawal [facility]) Diagnosis:   Patient Active Problem List   Diagnosis Date Noted  . Opiate withdrawal ([place]) [F11.23] 08/20/2016  . Opiate abuse, continuous [F11.10] 08/20/2016  . 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 Sinus tachycardia ([place]) [R00.0] 08/19/2016  . Tachycardia [R00.0] 02/13/2016  . Unstable angina ([place]) [I20.0] 02/11/2016  . CAP (community acquired pneumonia) [J18.9] 02/11/2016  . Severe sepsis ([place]) [A41.9, R65.20] 02/11/2016  . Rheumatoid arthritis ([place]) [M06.9]     Total Time spent with patient: 45 minutes  Subjective:   Paul Day is a 60 y.o. male patient admitted with "I'm pretty sick".  HPI:  Chart reviewed. Patient interviewed. Labs reviewed. This 60 year old gentleman with a history of cardiac disease came into the emergency room yesterday requesting assistance with heroin withdrawal. He has been admitted to the critical care unit for cardiac stabilization. I interviewed him briefly this afternoon. He is clearly not feeling well so I did not get all of the details. Patient tells me that he's been using heroin intravenously recently and that his last use was just prior to coming to the emergency room. He tells me he was also using cocaine although his drug screen is only positive for opiates. He denies that he was drinking or using any other sedatives. [place] his general pain although he has a particular soreness in his left ankle that may be something separate. He's been feeling sick to his stomach and has been having nausea and vomiting and some diarrhea today.  Social history: We didn't go into details but he  indicated to me that he really didn't have a stable place to live right now.  Medical history: Patient has a history of coronary artery disease rheumatoid arthritis angina hypertension. He is quite cachectic right now 2 and looks terribly nourished.  Substance abuse history: I didn't go into the whole history with him but he reports he's been using heroin recently. His symptoms and drug screen are certainly consistent with that.  Past Psychiatric History: Patient is denying any current suicidal ideation. We don't have any past psychiatric history in our records. Again we can follow-up on that when he is feeling a little better.  Risk to Self: Suicidal Ideation: No Suicidal Intent: No Is patient at risk for suicide?: No Suicidal Plan?: No Access to Means: No What has been your use of drugs/alcohol within the last 12 months?: Heroin How many times?: 0 Other Self Harm Risks: Active Addiction Triggers for Past Attempts: None known Intentional Self Injurious Behavior: None Risk to Others: Homicidal Ideation: No Thoughts of Harm to Others: No Current Homicidal Intent: No Current Homicidal Plan: No Access to Homicidal Means: No Identified Victim: Reports of none History of harm to others?: No Assessment of Violence: None Noted Violent Behavior Description: Reports of none Does patient have access to weapons?: No Criminal Charges Pending?: No Does patient have a court date: No Prior Inpatient Therapy: Prior Inpatient Therapy: No Prior Therapy Dates: Reports of none Prior Therapy Facilty/Provider(s): Reports of none Reason for Treatment: Reports of none Prior Outpatient Therapy: Prior Outpatient Therapy: No Prior Therapy Dates: Reports of none Prior Therapy Facilty/Provider(s): Reports of none Reason for Treatment: Reports of none Does patient  have an ACCT team?: No Does patient have Intensive In-House Services?  : No Does patient have [facility] services? : No Does patient have P4CC  services?: No  Past Medical History:  Past Medical History:  Diagnosis Date  . Hyperlipidemia   . Myocardial infarct ([place])   . Rheumatoid arthritis(714.0)     Past Surgical History:  Procedure Laterality Date  . LEG SURGERY Right    Family History: History reviewed. No pertinent family history. Family Psychiatric  History: None identified at this point Social History:  History  Alcohol Use No     History  Drug Use  . Types: Cocaine, Marijuana    Social History   Social History  . Marital status: Married    Spouse name: N/A  . Number of children: N/A  . Years of education: N/A   Social History Main Topics  . Smoking status: Current Every Day Smoker    Packs/day: 1.50    Types: Cigarettes  . Smokeless tobacco: Never Used  . Alcohol use No  . Drug use:     Types: Cocaine, Marijuana  . Sexual activity: Not Asked   Other Topics Concern  . None   Social History Narrative  . None   Additional Social History:    Allergies:   Allergies  Allergen Reactions  . Norco [Hydrocodone-Acetaminophen] Itching and Rash    Labs:  Results for orders placed or performed during the hospital encounter of 08/19/16 (from the past 48 hour(s))  Comprehensive metabolic panel     Status: Abnormal   Collection Time: 08/19/16  8:44 AM  Result Value Ref Range   Sodium 137 135 - 145 mmol/L   Potassium 3.6 3.5 - 5.1 mmol/L   Chloride 101 101 - 111 mmol/L   CO2 26 22 - 32 mmol/L   Glucose, Bld 91 65 - 99 mg/dL   BUN 14 6 - 20 mg/dL   Creatinine, Ser 0.56 (L) 0.61 - 1.24 mg/dL   Calcium 9.8 8.9 - 10.3 mg/dL   Total Protein 8.2 (H) 6.5 - 8.1 g/dL   Albumin 4.1 3.5 - 5.0 g/dL   AST 35 15 - 41 U/L   ALT 27 17 - 63 U/L   Alkaline Phosphatase 173 (H) 38 - 126 U/L   Total Bilirubin 0.6 0.3 - 1.2 mg/dL   GFR calc non Af Amer >60 >60 mL/min   GFR calc Af Amer >60 >60 mL/min    Comment: (NOTE) The eGFR has been calculated using the CKD EPI equation. This calculation has not been  validated in all clinical situations. eGFR's persistently <60 mL/min signify possible Chronic Kidney Disease.    Anion gap 10 5 - 15  Magnesium     Status: None   Collection Time: 08/19/16  8:44 AM  Result Value Ref Range   Magnesium 1.8 1.7 - 2.4 mg/dL  CBC with Differential     Status: Abnormal   Collection Time: 08/19/16  8:44 AM  Result Value Ref Range   WBC 6.8 3.8 - 10.6 K/uL   RBC 5.53 4.40 - 5.90 MIL/uL   Hemoglobin 14.9 13.0 - 18.0 g/dL   HCT 43.6 40.0 - 52.0 %   MCV 78.7 (L) 80.0 - 100.0 fL   MCH 27.0 26.0 - 34.0 pg   MCHC 34.3 32.0 - 36.0 g/dL   RDW 14.6 (H) 11.5 - 14.5 %   Platelets 169 150 - 440 K/uL   Neutrophils Relative % 79 %   Neutro Abs 5.4 1.4 -  6.5 K/uL   Lymphocytes Relative 13 %   Lymphs Abs 0.9 (L) 1.0 - 3.6 K/uL   Monocytes Relative 6 %   Monocytes Absolute 0.4 0.2 - 1.0 K/uL   Eosinophils Relative 1 %   Eosinophils Absolute 0.0 0 - 0.7 K/uL   Basophils Relative 1 %   Basophils Absolute 0.0 0 - 0.1 K/uL  Ethanol     Status: None   Collection Time: 08/19/16  8:44 AM  Result Value Ref Range   Alcohol, Ethyl (B) <5 <5 mg/dL    Comment:        LOWEST DETECTABLE LIMIT FOR SERUM ALCOHOL IS 5 mg/dL FOR MEDICAL PURPOSES ONLY   Urinalysis complete, with microscopic     Status: Abnormal   Collection Time: 08/19/16 11:51 AM  Result Value Ref Range   Color, Urine STRAW (A) YELLOW   APPearance CLEAR (A) CLEAR   Glucose, UA NEGATIVE NEGATIVE mg/dL   Bilirubin Urine NEGATIVE NEGATIVE   Ketones, ur 1+ (A) NEGATIVE mg/dL   Specific Gravity, Urine 1.010 1.005 - 1.030   Hgb urine dipstick NEGATIVE NEGATIVE   pH 7.0 5.0 - 8.0   Protein, ur NEGATIVE NEGATIVE mg/dL   Nitrite NEGATIVE NEGATIVE   Leukocytes, UA NEGATIVE NEGATIVE   RBC / HPF 0-5 0 - 5 RBC/hpf   WBC, UA 0-5 0 - 5 WBC/hpf   Bacteria, UA NONE SEEN NONE SEEN   Squamous Epithelial / LPF NONE SEEN NONE SEEN  Urine Drug Screen, Qualitative     Status: Abnormal   Collection Time: 08/19/16 11:51 AM   Result Value Ref Range   Tricyclic, Ur Screen NONE DETECTED NONE DETECTED   Amphetamines, Ur Screen NONE DETECTED NONE DETECTED   MDMA (Ecstasy)Ur Screen NONE DETECTED NONE DETECTED   Cocaine Metabolite,Ur [place] NONE DETECTED NONE DETECTED   Opiate, Ur Screen POSITIVE (A) NONE DETECTED   Phencyclidine (PCP) Ur S NONE DETECTED NONE DETECTED   Cannabinoid 50 Ng, Ur [place] NONE DETECTED NONE DETECTED   Barbiturates, Ur Screen NONE DETECTED NONE DETECTED   Benzodiazepine, Ur Scrn NONE DETECTED NONE DETECTED   Methadone Scn, Ur NONE DETECTED NONE DETECTED    Comment: (NOTE) 440  Tricyclics, urine               Cutoff 1000 ng/mL 200  Amphetamines, urine             Cutoff 1000 ng/mL 300  MDMA (Ecstasy), urine           Cutoff 500 ng/mL 400  Cocaine Metabolite, urine       Cutoff 300 ng/mL 500  Opiate, urine                   Cutoff 300 ng/mL 600  Phencyclidine (PCP), urine      Cutoff 25 ng/mL 700  Cannabinoid, urine              Cutoff 50 ng/mL 800  Barbiturates, urine             Cutoff 200 ng/mL 900  Benzodiazepine, urine           Cutoff 200 ng/mL 1000 Methadone, urine                Cutoff 300 ng/mL 1100 1200 The urine drug screen provides only a preliminary, unconfirmed 1300 analytical test result and should not be used for non-medical 1400 purposes. Clinical consideration and professional judgment should 1500 be applied to any positive drug screen result  due to possible 1600 interfering substances. A more specific alternate chemical method 1700 must be used in order to obtain a confirmed analytical result.  1800 Gas chromato graphy / mass spectrometry (GC/MS) is the preferred 1900 confirmatory method.   CK     Status: None   Collection Time: 08/19/16  1:25 PM  Result Value Ref Range   Total CK 108 49 - 397 U/L  Troponin I     Status: None   Collection Time: 08/19/16  1:25 PM  Result Value Ref Range   Troponin I <0.03 <0.03 ng/mL  TSH     Status: Abnormal   Collection Time:  08/19/16  1:25 PM  Result Value Ref Range   TSH 0.141 (L) 0.350 - 4.500 uIU/mL  Blood gas, arterial     Status: Abnormal   Collection Time: 08/19/16  1:31 PM  Result Value Ref Range   FIO2 0.21    pH, Arterial 7.48 (H) 7.350 - 7.450   pCO2 arterial 33 32.0 - 48.0 mmHg   pO2, Arterial 103 83.0 - 108.0 mmHg   Bicarbonate 24.6 20.0 - 28.0 mmol/L   Acid-Base Excess 1.7 0.0 - 2.0 mmol/L   O2 Saturation 98.3 %   Patient temperature 37.0    Collection site RIGHT RADIAL    Sample type ARTERIAL DRAW    Allens test (pass/fail) PASS PASS  Glucose, capillary     Status: Abnormal   Collection Time: 08/19/16  3:22 PM  Result Value Ref Range   Glucose-Capillary 101 (H) 65 - 99 mg/dL  MRSA PCR Screening     Status: Abnormal   Collection Time: 08/19/16  3:29 PM  Result Value Ref Range   MRSA by PCR POSITIVE (A) NEGATIVE    Comment:        The GeneXpert MRSA Assay (FDA approved for NASAL specimens only), is one component of a comprehensive MRSA colonization surveillance program. It is not intended to diagnose MRSA infection nor to guide or monitor treatment for MRSA infections. CRITICAL RESULT CALLED TO, READ BACK BY AND VERIFIED WITH: JAIME SMITH GREGORY'@0900'  ON 08/20/16 BY HKP   Troponin I     Status: None   Collection Time: 08/19/16  5:51 PM  Result Value Ref Range   Troponin I <0.03 <0.03 ng/mL  Troponin I     Status: None   Collection Time: 08/19/16  9:36 PM  Result Value Ref Range   Troponin I <0.03 <0.03 ng/mL  Troponin I     Status: None   Collection Time: 08/20/16  1:14 AM  Result Value Ref Range   Troponin I <0.03 <0.03 ng/mL  Comprehensive metabolic panel     Status: Abnormal   Collection Time: 08/20/16  4:02 AM  Result Value Ref Range   Sodium 139 135 - 145 mmol/L   Potassium 3.2 (L) 3.5 - 5.1 mmol/L   Chloride 106 101 - 111 mmol/L   CO2 24 22 - 32 mmol/L   Glucose, Bld 143 (H) 65 - 99 mg/dL   BUN 14 6 - 20 mg/dL   Creatinine, Ser 1.03 0.61 - 1.24 mg/dL   Calcium  9.3 8.9 - 10.3 mg/dL   Total Protein 7.3 6.5 - 8.1 g/dL   Albumin 3.7 3.5 - 5.0 g/dL   AST 29 15 - 41 U/L   ALT 25 17 - 63 U/L   Alkaline Phosphatase 149 (H) 38 - 126 U/L   Total Bilirubin 0.6 0.3 - 1.2 mg/dL   GFR calc non Af  Amer >60 >60 mL/min   GFR calc Af Amer >60 >60 mL/min    Comment: (NOTE) The eGFR has been calculated using the CKD EPI equation. This calculation has not been validated in all clinical situations. eGFR's persistently <60 mL/min signify possible Chronic Kidney Disease.    Anion gap 9 5 - 15  CBC     Status: Abnormal   Collection Time: 08/20/16  4:02 AM  Result Value Ref Range   WBC 6.8 3.8 - 10.6 K/uL   RBC 5.43 4.40 - 5.90 MIL/uL   Hemoglobin 14.6 13.0 - 18.0 g/dL   HCT 42.5 40.0 - 52.0 %   MCV 78.2 (L) 80.0 - 100.0 fL   MCH 26.8 26.0 - 34.0 pg   MCHC 34.3 32.0 - 36.0 g/dL   RDW 14.4 11.5 - 14.5 %   Platelets 169 150 - 440 K/uL    Current Facility-Administered Medications  Medication Dose Route Frequency Provider Last Rate Last Dose  . atorvastatin (LIPITOR) tablet 80 mg  80 mg Oral QPM Theodoro Grist, MD   80 mg at 08/20/16 1744  . [START ON 08/21/2016] buprenorphine (SUBUTEX) SL tablet 4 mg  4 mg Sublingual BID Gonzella Lex, MD      . buprenorphine (SUBUTEX) SL tablet 8 mg  8 mg Sublingual STAT Gonzella Lex, MD      . Chlorhexidine Gluconate Cloth 2 % PADS 6 each  6 each Topical Q0600 Loletha Grayer, MD   6 each at 08/20/16 1054  . dexmedetomidine (PRECEDEX) 400 MCG/100ML (4 mcg/mL) infusion  0.4-0.7 mcg/kg/hr Intravenous Titrated Theodoro Grist, MD   Stopped at 08/20/16 1322  . dextrose 5 % and 0.45 % NaCl with KCl 20 mEq/L infusion   Intravenous Continuous Theodoro Grist, MD 100 mL/hr at 08/20/16 1400    . enoxaparin (LOVENOX) injection 40 mg  40 mg Subcutaneous Q24H Theodoro Grist, MD   40 mg at 08/19/16 1931  . labetalol (NORMODYNE,TRANDATE) injection 10 mg  10 mg Intravenous Q4H PRN Theodoro Grist, MD      . lidocaine (XYLOCAINE) 5 % ointment 1  application  1 application Topical TID Theodoro Grist, MD   1 application at 48/25/00 1052  . LORazepam (ATIVAN) injection 2 mg  2 mg Intravenous Q1H PRN Theodoro Grist, MD   2 mg at 08/19/16 2309  . methimazole (TAPAZOLE) tablet 20 mg  20 mg Oral Daily Theodoro Grist, MD   20 mg at 08/20/16 1054  . mupirocin ointment (BACTROBAN) 2 % 1 application  1 application Nasal BID Loletha Grayer, MD   1 application at 37/04/88 1052  . ondansetron (ZOFRAN) tablet 4 mg  4 mg Oral Q6H PRN Theodoro Grist, MD       Or  . ondansetron (ZOFRAN) injection 4 mg  4 mg Intravenous Q6H PRN Theodoro Grist, MD      . potassium chloride 10 mEq in 100 mL IVPB  10 mEq Intravenous Q1 Hr x 4 Merilyn Baba, RPH   10 mEq at 08/20/16 1749  . promethazine (PHENERGAN) injection 12.5 mg  12.5 mg Intravenous Q6H PRN Schuyler Amor, MD   12.5 mg at 08/20/16 0352  . propranolol (INDERAL) tablet 80 mg  80 mg Oral TID Theodoro Grist, MD   80 mg at 08/20/16 1744  . sodium chloride flush (NS) 0.9 % injection 3 mL  3 mL Intravenous Q12H Theodoro Grist, MD   3 mL at 08/20/16 1000    Musculoskeletal: Strength & Muscle Tone: decreased Gait &  Station: unable to stand Patient leans: N/A  Psychiatric Specialty Exam: Physical Exam  Nursing note and vitals reviewed. Constitutional: He appears well-developed. He appears cachectic.  HENT:  Head: Normocephalic and atraumatic.  Eyes: Conjunctivae are normal. Pupils are equal, round, and reactive to light.  Neck: Normal range of motion.  Cardiovascular: Tachycardia present.   Respiratory: Effort normal.  GI: Soft.  Musculoskeletal: Normal range of motion.  Neurological: He is alert.  Skin: Skin is warm and dry.  Psychiatric: His mood appears anxious. His speech is delayed. He is withdrawn. He expresses no suicidal ideation.    ROS  Blood pressure 118/66, pulse (!) 101, temperature 98.1 F (36.7 C), temperature source Oral, resp. rate (!) 22, height '5\' 6"'  (1.676 m), weight 44.4 kg (97 lb  14.2 oz), SpO2 94 %.Body mass index is 15.8 kg/m.  General Appearance: Disheveled  Eye Contact:  Fair  Speech:  Garbled and Slow  Volume:  Decreased  Mood:  Dysphoric  Affect:  Constricted  Thought Process:  Coherent  Orientation:  Full (Time, Place, and Person)  Thought Content:  Logical  Suicidal Thoughts:  No  Homicidal Thoughts:  No  Memory:  Negative  Judgement:  Fair  Insight:  Good  Psychomotor Activity:  Decreased  Concentration:  Concentration: Fair  Recall:  [employer] of Knowledge:  Fair  Language:  Fair  Akathisia:  No  Handed:  Right  AIMS (if indicated):     Assets:  Communication Skills Desire for Improvement  ADL's:  Impaired  Cognition:  Impaired,  Mild  Sleep:        Treatment Plan Summary: Daily contact with patient to assess and evaluate symptoms and progress in treatment, Medication management and Plan 60 year old man with opiate abuse and dependence. Currently having symptoms consistent with opiate withdrawal. Law allows for the use of buprenorphine for up to 3 days in a hospital setting with the goal of opiate withdrawal. Patient will be given 8 mg of Subutex tonight and then 4 mg twice a day for the following 2 days. I explained the procedure to him and that this may not get him through a full detox but will give [place] a couple days for him to physically stabilize and then we can deal with other symptoms as needed. Patient is agreeable to the plan. Reviewed this with nursing. I will follow-up with him tomorrow.  Disposition: Supportive therapy provided about ongoing stressors.  Alethia Berthold, MD 08/20/2016 6:30 PM

## 2016-08-20 NOTE — Progress Notes (Signed)
Pt rr has continuously registered high (30-38 bpm) on monitor throughout day, this rn has manually monitored rr several times (documented on flow sheets) and rr is between 20-26 bpm. Dr made aware. rn will continue to monitor.

## 2016-08-20 NOTE — Progress Notes (Signed)
CONSULT NOTE  Pharmacy has been consulted to assist in electrolyte management in this 8 yoM admitted on 9/19 with complaints of heroin withdrawal and bilateral lower extremity pain.  Electrolytes: 9/20 am labs K = 3.2; no mag. Patient receiving potassium ~48 mEq/day continuous infusion with D5 and 0.45% NaCl and will be given another K 10 mEq q 1 hour x 4. Will recheck K at 2230. BMP, magnesium ordered with am labs   Allergies  Allergen Reactions  . Norco [Hydrocodone-Acetaminophen] Itching and Rash    Patient Measurements: Height: 5\' 6"  (167.6 cm) Weight: 97 lb 14.2 oz (44.4 kg) IBW/kg (Calculated) : 63.8   Vital Signs: Temp: 98.1 F (36.7 C) (09/20 1430) Temp Source: Oral (09/20 1430) BP: 107/62 (09/20 1500) Pulse Rate: 85 (09/20 1500) Intake/Output from previous day: 09/19 0701 - 09/20 0700 In: 1557.3 [I.V.:1557.3] Out: 325 [Urine:325] Intake/Output from this shift: Total I/O In: 950.2 [P.O.:120; I.V.:830.2] Out: 350 [Urine:350]  Labs:  Recent Labs  08/19/16 0844 08/20/16 0402  WBC 6.8 6.8  HGB 14.9 14.6  HCT 43.6 42.5  PLT 169 169     Recent Labs  08/19/16 0844 08/20/16 0402  NA 137 139  K 3.6 3.2*  CL 101 106  CO2 26 24  GLUCOSE 91 143*  BUN 14 14  CREATININE 0.56* 1.03  CALCIUM 9.8 9.3  MG 1.8  --   PROT 8.2* 7.3  ALBUMIN 4.1 3.7  AST 35 29  ALT 27 25  ALKPHOS 173* 149*  BILITOT 0.6 0.6   Estimated Creatinine Clearance: 47.3 mL/min (by C-G formula based on SCr of 1.03 mg/dL).    Recent Labs  08/19/16 1522  GLUCAP 101*    Medications:  Scheduled:  . atorvastatin  80 mg Oral QPM  . Chlorhexidine Gluconate Cloth  6 each Topical Q0600  . enoxaparin (LOVENOX) injection  40 mg Subcutaneous Q24H  . lidocaine  1 application Topical TID  . methimazole  20 mg Oral Daily  . mupirocin ointment  1 application Nasal BID  . potassium chloride  10 mEq Intravenous Q1 Hr x 4  . propranolol  80 mg Oral TID  . sodium chloride flush  3 mL  Intravenous Q12H   Infusions:  . dexmedetomidine Stopped (08/20/16 1322)  . dextrose 5 % and 0.45 % NaCl with KCl 20 mEq/L 100 mL/hr at 08/20/16 1400    08/22/16, PharmD Pharmacy Resident 08/20/2016 3:27 PM

## 2016-08-20 NOTE — Consult Note (Signed)
  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 His TSH was low on admission this time. I have gone ahead and ordered a thyroid panel as well.

## 2016-08-21 ENCOUNTER — Inpatient Hospital Stay: Payer: Self-pay

## 2016-08-21 LAB — BASIC METABOLIC PANEL
ANION GAP: 3 — AB (ref 5–15)
BUN: 20 mg/dL (ref 6–20)
CO2: 27 mmol/L (ref 22–32)
Calcium: 9.3 mg/dL (ref 8.9–10.3)
Chloride: 110 mmol/L (ref 101–111)
Creatinine, Ser: 0.73 mg/dL (ref 0.61–1.24)
GLUCOSE: 116 mg/dL — AB (ref 65–99)
POTASSIUM: 4.4 mmol/L (ref 3.5–5.1)
Sodium: 140 mmol/L (ref 135–145)

## 2016-08-21 LAB — MAGNESIUM: Magnesium: 1.7 mg/dL (ref 1.7–2.4)

## 2016-08-21 MED ORDER — LORAZEPAM 2 MG/ML IJ SOLN: 0.5000 mg | Freq: Four times a day (QID) | INTRAMUSCULAR | Status: DC | PRN

## 2016-08-21 MED ORDER — IBUPROFEN 400 MG PO TABS
600.0000 mg | ORAL_TABLET | Freq: Three times a day (TID) | ORAL | Status: DC
  Administered 2016-08-21 – 2016-08-22 (×3): 600 mg via ORAL
  Filled 2016-08-21 (×3): qty 2

## 2016-08-21 MED ORDER — MAGNESIUM SULFATE 2 GM/50ML IV SOLN
2.0000 g | Freq: Once | INTRAVENOUS | Status: AC
  Administered 2016-08-21: 2 g via INTRAVENOUS
  Filled 2016-08-21: qty 50

## 2016-08-21 MED ORDER — DIPHENOXYLATE-ATROPINE 2.5-0.025 MG PO TABS: 1.0000 | ORAL_TABLET | Freq: Four times a day (QID) | ORAL | Status: DC | PRN

## 2016-08-21 NOTE — Progress Notes (Signed)
CONSULT NOTE  Pharmacy has been consulted to assist in electrolyte management in this 62 yoM admitted on 9/19 with complaints of heroin withdrawal and bilateral lower extremity pain.  Electrolytes: 9/20 am labs K = 3.2; no mag. Patient receiving potassium ~48 mEq/day continuous infusion with D5 and 0.45% NaCl and will be given another K 10 mEq q 1 hour x 4. Will recheck K at 2230. BMP, magnesium ordered with am labs  9/20 23:30 K+ 4.5. No supplementation indicated. Recheck in AM.  Allergies  Allergen Reactions  . Norco [Hydrocodone-Acetaminophen] Itching and Rash    Patient Measurements: Height: 5\' 6"  (167.6 cm) Weight: 101 lb (45.8 kg) IBW/kg (Calculated) : 63.8   Vital Signs: Temp: 98.5 F (36.9 C) (09/21 0214) Temp Source: Oral (09/21 0214) BP: 124/71 (09/21 0214) Pulse Rate: 84 (09/21 0214) Intake/Output from previous day: 09/20 0701 - 09/21 0700 In: 1590.2 [P.O.:260; I.V.:1230.2; IV Piggyback:100] Out: 600 [Urine:600] Intake/Output from this shift: No intake/output data recorded.  Labs:  Recent Labs  08/19/16 0844 08/20/16 0402  WBC 6.8 6.8  HGB 14.9 14.6  HCT 43.6 42.5  PLT 169 169     Recent Labs  08/19/16 0844 08/20/16 0402 08/20/16 2326  NA 137 139  --   K 3.6 3.2* 4.5  CL 101 106  --   CO2 26 24  --   GLUCOSE 91 143*  --   BUN 14 14  --   CREATININE 0.56* 1.03  --   CALCIUM 9.8 9.3  --   MG 1.8  --   --   PROT 8.2* 7.3  --   ALBUMIN 4.1 3.7  --   AST 35 29  --   ALT 27 25  --   ALKPHOS 173* 149*  --   BILITOT 0.6 0.6  --    Estimated Creatinine Clearance: 48.8 mL/min (by C-G formula based on SCr of 1.03 mg/dL).    Recent Labs  08/19/16 1522  GLUCAP 101*    Medications:  Scheduled:  . atorvastatin  80 mg Oral QPM  . buprenorphine  4 mg Sublingual BID  . Chlorhexidine Gluconate Cloth  6 each Topical Q0600  . enoxaparin (LOVENOX) injection  40 mg Subcutaneous Q24H  . lidocaine  1 application Topical TID  . methimazole  20 mg  Oral Daily  . mupirocin ointment  1 application Nasal BID  . propranolol  80 mg Oral TID  . sodium chloride flush  3 mL Intravenous Q12H   Infusions:  . dexmedetomidine Stopped (08/20/16 1322)  . dextrose 5 % and 0.45 % NaCl with KCl 20 mEq/L 100 mL/hr at 08/20/16 2016    2017, PharmD Pharmacy Resident 08/21/2016 2:43 AM

## 2016-08-21 NOTE — Progress Notes (Signed)
Patient ID: Paul Day, male   DOB: 1956-04-15, 60 y.o.   MRN: [id number]  [facility] PROGRESS NOTE  MARKES SHATSWELL ONG:[id number] DOB: 03-Oct-1956 DOA: 08/19/2016 PCP: Default, Provider, MD  HPI/Subjective: Patient still complaining of left leg pain. His voice is a little stronger today. He is asking for something for his heroin withdrawal  Objective: Vitals:   08/21/16 0820 08/21/16 1207  BP: 113/62 105/64  Pulse: 80 70  Resp: 18 14  Temp: 98.4 F (36.9 C) 98.3 F (36.8 C)    Filed Weights   08/19/16 0812 08/19/16 1522 08/21/16 0214  Weight: 45.4 kg (100 lb) 44.4 kg (97 lb 14.2 oz) 45.8 kg (101 lb)    ROS: Review of Systems  Respiratory: Negative for cough and shortness of breath.   Cardiovascular: Negative for chest pain.  Gastrointestinal: Positive for diarrhea. Negative for abdominal pain, nausea and vomiting.  Musculoskeletal: Positive for joint pain.  Neurological: Positive for weakness.   Exam: Physical Exam  Constitutional: He appears cachectic.  HENT:  Nose: No mucosal edema.  Mouth/Throat: No oropharyngeal exudate or posterior oropharyngeal edema.  Eyes: Conjunctivae, EOM and lids are normal. Pupils are equal, round, and reactive to light.  Neck: No JVD present. Carotid bruit is not present. No edema present. No thyroid mass and no thyromegaly present.  Cardiovascular: S1 normal and S2 normal.  Exam reveals no gallop.   No murmur heard. Pulses:      Dorsalis pedis pulses are 2+ on the right side, and 2+ on the left side.  Respiratory: No respiratory distress. He has no wheezes. He has no rhonchi. He has no rales.  GI: Soft. Bowel sounds are normal. There is no tenderness.  Musculoskeletal:       Right ankle: He exhibits no swelling.       Left ankle: He exhibits no swelling.  Pain over the left anterior tibialis muscle.  Lymphadenopathy:    He has no cervical adenopathy.  Neurological: He is alert.  Skin: Skin is warm. No rash noted. Nails show  no clubbing.  Psychiatric: He has a normal mood and affect.      Data Reviewed: Basic Metabolic Panel:  Recent Labs Lab 08/19/16 0844 08/20/16 0402 08/20/16 2326 08/21/16 0434  NA 137 139  --  140  K 3.6 3.2* 4.5 4.4  CL 101 106  --  110  CO2 26 24  --  27  GLUCOSE 91 143*  --  116*  BUN 14 14  --  20  CREATININE 0.56* 1.03  --  0.73  CALCIUM 9.8 9.3  --  9.3  MG 1.8  --   --  1.7   Liver Function Tests:  Recent Labs Lab 08/19/16 0844 08/20/16 0402  AST 35 29  ALT 27 25  ALKPHOS 173* 149*  BILITOT 0.6 0.6  PROT 8.2* 7.3  ALBUMIN 4.1 3.7   CBC:  Recent Labs Lab 08/19/16 0844 08/20/16 0402  WBC 6.8 6.8  NEUTROABS 5.4  --   HGB 14.9 14.6  HCT 43.6 42.5  MCV 78.7* 78.2*  PLT 169 169   Cardiac Enzymes:  Recent Labs Lab 08/19/16 1325 08/19/16 1751 08/19/16 2136 08/20/16 0114  CKTOTAL 108  --   --   --   TROPONINI <0.03 <0.03 <0.03 <0.03    CBG:  Recent Labs Lab 08/19/16 1522  GLUCAP 101*    Recent Results (from the past 240 hour(s))  MRSA PCR Screening     Status: Abnormal  Collection Time: 08/19/16  3:29 PM  Result Value Ref Range Status   MRSA by PCR POSITIVE (A) NEGATIVE Final    Comment:        The GeneXpert MRSA Assay (FDA approved for NASAL specimens only), is one component of a comprehensive MRSA colonization surveillance program. It is not intended to diagnose MRSA infection nor to guide or monitor treatment for MRSA infections. CRITICAL RESULT CALLED TO, READ BACK BY AND VERIFIED WITH: JAIME SMITH GREGORY@0900  ON 08/20/16 BY [facility]      Studies: Dg Tibia/fibula Left  Result Date: 08/21/2016 CLINICAL DATA:  Patient with lower extremity pain for 4 hours. No known injury. Initial encounter. EXAM: LEFT TIBIA AND FIBULA - 2 VIEW COMPARISON:  None. FINDINGS: There is no evidence of fracture or other focal bone lesions. Soft tissues are unremarkable. IMPRESSION: No acute osseous abnormality. Electronically Signed   By: Annia Belt M.D.   On: 08/21/2016 09:39   Dg Chest Port 1 View  Result Date: 08/20/2016 CLINICAL DATA:  Tachycardia. EXAM: PORTABLE CHEST 1 VIEW COMPARISON:  05/24/2016 FINDINGS: Mild hyperinflation. The heart size and mediastinal contours are within normal limits. Both lungs are clear. The visualized skeletal structures are unremarkable. IMPRESSION: No active disease. Electronically Signed   By: Burman Nieves M.D.   On: 08/20/2016 05:57    Scheduled Meds: . atorvastatin  80 mg Oral QPM  . buprenorphine  4 mg Sublingual BID  . Chlorhexidine Gluconate Cloth  6 each Topical Q0600  . enoxaparin (LOVENOX) injection  40 mg Subcutaneous Q24H  . lidocaine  1 application Topical TID  . methimazole  20 mg Oral Daily  . mupirocin ointment  1 application Nasal BID  . propranolol  80 mg Oral TID  . sodium chloride flush  3 mL Intravenous Q12H    Assessment/Plan:  1. Heroin withdrawal with nausea vomiting and diarrhea. Patient on buprenorphine. 2. 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 Likely secondary to her when withdrawal. Blood pressure on the lower side. 3. Acute encephalopathy secondary to heroin withdrawal. 4. Hyperthyroidism on methimazole and propranolol 5. Hyperlipidemia unspecified on atorvastatin 6. History of rheumatoid arthritis 7. Left anterior tibialis inflammation. We'll give anti-inflammatory ibuprofen.  Code Status:     Code Status Orders        Start     Ordered   08/19/16 1525  Full code  Continuous     08/19/16 1524    Code Status History    Date Active Date Inactive Code Status Order ID Comments User Context   02/11/2016 10:37 PM 02/13/2016  6:13 PM Full Code [id number]  Oralia Manis, MD Inpatient     Disposition Plan: Reevaluate tomorrow  Consultants:  Psychiatry  Time spent:  24 minutes  Alford Highland  [facility]

## 2016-08-21 NOTE — Consult Note (Signed)
[facility] Consult   Reason for Consult:  Consult for this 60 year old man with a history of heroin abuse who came into the hospital for detox Referring Physician:  Earleen Newport Patient Identification: Paul Day MRN:  [id number] Principal Diagnosis: Opiate withdrawal [facility]) Diagnosis:   Patient Active Problem List   Diagnosis Date Noted  . Opiate withdrawal ([place]) [F11.23] 08/20/2016  . Opiate abuse, continuous [F11.10] 08/20/2016  . 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 Sinus tachycardia ([place]) [R00.0] 08/19/2016  . Tachycardia [R00.0] 02/13/2016  . Unstable angina ([place]) [I20.0] 02/11/2016  . CAP (community acquired pneumonia) [J18.9] 02/11/2016  . Severe sepsis ([place]) [A41.9, R65.20] 02/11/2016  . Rheumatoid arthritis ([place]) [M06.9]     Total Time spent with patient: 45 minutes  Subjective:   Paul Day is a 61 y.o. male patient admitted with "I just need to get this monkey off my back".  HPI:  60 year old man. This is a follow-up after having seen him yesterday. Patient came into the hospital with symptoms of opiate withdrawal including generalized pain and weakness, nausea and vomiting, diarrhea. Patient reports he had been using multiple bags of intravenous heroin prior to coming into the hospital. Additionally he was having tachycardia and unstable cardiac status. He was briefly in the intensive care unit and has now been transferred out to the floor of the telemetry unit. He tells me today he is feeling better after getting the Subutex. Patient denies depression. Denies suicidal or homicidal ideation. Denies any hallucinations. He is still having some nausea but has been able to eat today. Patient reports that he has a place to live outside the hospital. He has significant financial problems and has been having a difficult time working lately because of his debilitated physical shape.  Social history: Seems to be with limited support. He says he has a  place to stay outside the hospital not that of his own. He does tree work for a living. It's kind of hard to believe somebody as debilitated as he is right now is able to do that kind of heavy manual labor.  Medical history: History of rheumatoid arthritis history of angina history of pneumonia.  Substance abuse history: Patient reports he's been using heroin for about 4 years now. He's been able to stay off of it for brief periods of time when he has bought Suboxone on the street. Otherwise hasn't really been able to stop. He intermittently uses other drugs such as cocaine but narcotics of the dominant problem. He has never really engaged in any kind of appropriate outpatient substance abuse treatment.  Past Psychiatric History: Denies any treatment for psychiatric illness other than substance abuse. Denies suicidal or homicidal ideation. Denies any psychotic symptoms. Not on any other psychiatric medicine.  Risk to Self: Suicidal Ideation: No Suicidal Intent: No Is patient at risk for suicide?: No Suicidal Plan?: No Access to Means: No What has been your use of drugs/alcohol within the last 12 months?: Heroin How many times?: 0 Other Self Harm Risks: Active Addiction Triggers for Past Attempts: None known Intentional Self Injurious Behavior: None Risk to Others: Homicidal Ideation: No Thoughts of Harm to Others: No Current Homicidal Intent: No Current Homicidal Plan: No Access to Homicidal Means: No Identified Victim: Reports of none History of harm to others?: No Assessment of Violence: None Noted Violent Behavior Description: Reports of none Does patient have access to weapons?: No Criminal Charges Pending?: No Does patient have a court date: No Prior Inpatient Therapy: Prior  Inpatient Therapy: No Prior Therapy Dates: Reports of none Prior Therapy Facilty/Provider(s): Reports of none Reason for Treatment: Reports of none Prior Outpatient Therapy: Prior Outpatient Therapy:  No Prior Therapy Dates: Reports of none Prior Therapy Facilty/Provider(s): Reports of none Reason for Treatment: Reports of none Does patient have an ACCT team?: No Does patient have Intensive In-House Services?  : No Does patient have [facility] services? : No Does patient have P4CC services?: No  Past Medical History:  Past Medical History:  Diagnosis Date  . Hyperlipidemia   . Myocardial infarct ([place])   . Rheumatoid arthritis(714.0)     Past Surgical History:  Procedure Laterality Date  . LEG SURGERY Right    Family History: History reviewed. No pertinent family history. Family Psychiatric  History: Denies knowing of any family history of mental health problems Social History:  History  Alcohol Use No     History  Drug Use  . Types: Cocaine, Marijuana    Social History   Social History  . Marital status: Married    Spouse name: N/A  . Number of children: N/A  . Years of education: N/A   Social History Main Topics  . Smoking status: Current Every Day Smoker    Packs/day: 1.50    Types: Cigarettes  . Smokeless tobacco: Never Used  . Alcohol use No  . Drug use:     Types: Cocaine, Marijuana  . Sexual activity: Not Asked   Other Topics Concern  . None   Social History Narrative  . None   Additional Social History:    Allergies:   Allergies  Allergen Reactions  . Norco [Hydrocodone-Acetaminophen] Itching and Rash    Labs:  Results for orders placed or performed during the hospital encounter of 08/19/16 (from the past 48 hour(s))  Troponin I     Status: None   Collection Time: 08/19/16  9:36 PM  Result Value Ref Range   Troponin I <0.03 <0.03 ng/mL  Troponin I     Status: None   Collection Time: 08/20/16  1:14 AM  Result Value Ref Range   Troponin I <0.03 <0.03 ng/mL  Comprehensive metabolic panel     Status: Abnormal   Collection Time: 08/20/16  4:02 AM  Result Value Ref Range   Sodium 139 135 - 145 mmol/L   Potassium 3.2 (L) 3.5 - 5.1 mmol/L    Chloride 106 101 - 111 mmol/L   CO2 24 22 - 32 mmol/L   Glucose, Bld 143 (H) 65 - 99 mg/dL   BUN 14 6 - 20 mg/dL   Creatinine, Ser 1.03 0.61 - 1.24 mg/dL   Calcium 9.3 8.9 - 10.3 mg/dL   Total Protein 7.3 6.5 - 8.1 g/dL   Albumin 3.7 3.5 - 5.0 g/dL   AST 29 15 - 41 U/L   ALT 25 17 - 63 U/L   Alkaline Phosphatase 149 (H) 38 - 126 U/L   Total Bilirubin 0.6 0.3 - 1.2 mg/dL   GFR calc non Af Amer >60 >60 mL/min   GFR calc Af Amer >60 >60 mL/min    Comment: (NOTE) The eGFR has been calculated using the CKD EPI equation. This calculation has not been validated in all clinical situations. eGFR's persistently <60 mL/min signify possible Chronic Kidney Disease.    Anion gap 9 5 - 15  CBC     Status: Abnormal   Collection Time: 08/20/16  4:02 AM  Result Value Ref Range   WBC 6.8 3.8 - 10.6 K/uL  RBC 5.43 4.40 - 5.90 MIL/uL   Hemoglobin 14.6 13.0 - 18.0 g/dL   HCT 42.5 40.0 - 52.0 %   MCV 78.2 (L) 80.0 - 100.0 fL   MCH 26.8 26.0 - 34.0 pg   MCHC 34.3 32.0 - 36.0 g/dL   RDW 14.4 11.5 - 14.5 %   Platelets 169 150 - 440 K/uL  Potassium     Status: None   Collection Time: 08/20/16 11:26 PM  Result Value Ref Range   Potassium 4.5 3.5 - 5.1 mmol/L  Basic metabolic panel     Status: Abnormal   Collection Time: 08/21/16  4:34 AM  Result Value Ref Range   Sodium 140 135 - 145 mmol/L   Potassium 4.4 3.5 - 5.1 mmol/L   Chloride 110 101 - 111 mmol/L   CO2 27 22 - 32 mmol/L   Glucose, Bld 116 (H) 65 - 99 mg/dL   BUN 20 6 - 20 mg/dL   Creatinine, Ser 0.73 0.61 - 1.24 mg/dL   Calcium 9.3 8.9 - 10.3 mg/dL   GFR calc non Af Amer >60 >60 mL/min   GFR calc Af Amer >60 >60 mL/min    Comment: (NOTE) The eGFR has been calculated using the CKD EPI equation. This calculation has not been validated in all clinical situations. eGFR's persistently <60 mL/min signify possible Chronic Kidney Disease.    Anion gap 3 (L) 5 - 15  Magnesium     Status: None   Collection Time: 08/21/16  4:34 AM   Result Value Ref Range   Magnesium 1.7 1.7 - 2.4 mg/dL    Current Facility-Administered Medications  Medication Dose Route Frequency Provider Last Rate Last Dose  . atorvastatin (LIPITOR) tablet 80 mg  80 mg Oral QPM Theodoro Grist, MD   80 mg at 08/21/16 1719  . buprenorphine (SUBUTEX) SL tablet 4 mg  4 mg Sublingual BID Gonzella Lex, MD   4 mg at 08/21/16 1016  . Chlorhexidine Gluconate Cloth 2 % PADS 6 each  6 each Topical Q0600 Loletha Grayer, MD   6 each at 08/21/16 0600  . diphenoxylate-atropine (LOMOTIL) 2.5-0.025 MG per tablet 1 tablet  1 tablet Oral QID PRN Loletha Grayer, MD      . enoxaparin (LOVENOX) injection 40 mg  40 mg Subcutaneous Q24H Theodoro Grist, MD   40 mg at 08/20/16 2015  . ibuprofen (ADVIL,MOTRIN) tablet 600 mg  600 mg Oral TID Loletha Grayer, MD   600 mg at 08/21/16 1536  . labetalol (NORMODYNE,TRANDATE) injection 10 mg  10 mg Intravenous Q4H PRN Theodoro Grist, MD      . lidocaine (XYLOCAINE) 5 % ointment 1 application  1 application Topical TID Theodoro Grist, MD   1 application at 89/16/94 1536  . LORazepam (ATIVAN) injection 0.5 mg  0.5 mg Intravenous Q6H PRN Loletha Grayer, MD      . methimazole (TAPAZOLE) tablet 20 mg  20 mg Oral Daily Theodoro Grist, MD   20 mg at 08/21/16 1015  . mupirocin ointment (BACTROBAN) 2 % 1 application  1 application Nasal BID Loletha Grayer, MD   1 application at 50/38/88 1016  . ondansetron (ZOFRAN) tablet 4 mg  4 mg Oral Q6H PRN Theodoro Grist, MD       Or  . ondansetron (ZOFRAN) injection 4 mg  4 mg Intravenous Q6H PRN Theodoro Grist, MD      . promethazine (PHENERGAN) injection 12.5 mg  12.5 mg Intravenous Q6H PRN Schuyler Amor, MD  12.5 mg at 08/20/16 0352  . propranolol (INDERAL) tablet 80 mg  80 mg Oral TID Theodoro Grist, MD   80 mg at 08/21/16 1536  . sodium chloride flush (NS) 0.9 % injection 3 mL  3 mL Intravenous Q12H Theodoro Grist, MD   3 mL at 08/21/16 1017    Musculoskeletal: Strength & Muscle Tone:  decreased Gait & Station: unsteady Patient leans: N/A  Psychiatric Specialty Exam: Physical Exam  Nursing note and vitals reviewed. Constitutional: He appears well-nourished. He appears cachectic.  HENT:  Head: Normocephalic and atraumatic.  Eyes: Conjunctivae are normal. Pupils are equal, round, and reactive to light.  Neck: Normal range of motion.  Cardiovascular: Normal heart sounds.   Respiratory: Effort normal.  GI: Soft.  Musculoskeletal: Normal range of motion.  Neurological: He is alert.  Skin: Skin is warm and dry.  Psychiatric: His speech is normal. Judgment normal. His affect is blunt. He is slowed. Cognition and memory are normal. He expresses no homicidal and no suicidal ideation.    Review of Systems  Constitutional: Positive for malaise/fatigue.  HENT: Negative.   Eyes: Negative.   Respiratory: Negative.   Cardiovascular: Negative.   Gastrointestinal: Negative.   Musculoskeletal: Negative.   Skin: Negative.   Neurological: Negative.   Psychiatric/Behavioral: Positive for substance abuse. Negative for depression, hallucinations, memory loss and suicidal ideas. The patient is not nervous/anxious and does not have insomnia.     Blood pressure 105/64, pulse 70, temperature 98.3 F (36.8 C), temperature source Oral, resp. rate 14, height '5\' 6"'  (1.676 m), weight 45.8 kg (101 lb), SpO2 97 %.Body mass index is 16.3 kg/m.  General Appearance: Disheveled  Eye Contact:  Minimal  Speech:  Slow  Volume:  Decreased  Mood:  Euthymic  Affect:  Blunt  Thought Process:  Goal Directed  Orientation:  Full (Time, Place, and Person)  Thought Content:  Logical  Suicidal Thoughts:  No  Homicidal Thoughts:  No  Memory:  Immediate;   Good Recent;   Fair Remote;   Fair  Judgement:  Fair  Insight:  Fair  Psychomotor Activity:  Decreased  Concentration:  Concentration: Fair  Recall:  [employer] of Knowledge:  Fair  Language:  Fair  Akathisia:  No  Handed:  Right  AIMS (if  indicated):     Assets:  Communication Skills Desire for Improvement Resilience  ADL's:  Impaired  Cognition:  Impaired,  Mild  Sleep:        Treatment Plan Summary: Daily contact with patient to assess and evaluate symptoms and progress in treatment, Medication management and Plan See note below note below  Disposition: Daily contact with patient to assess and evaluate symptoms and progress in treatment, Medication management and Plan 60 year old man with heroin addiction. I had put him on a modest dose of Subutex yesterday. As I said yesterday legally this can be done for only 72 hours. The order is written to continue through tomorrow evening at which time we are no longer able to do narcotic replacement. If he is still in the hospital he will need to be treated with other options such as muscle relaxers, antiemetic medicines or clonidine if tolerated. Patient and I talked about his plans for the future. There is no question he has a difficult prognosis ahead of him with no financial support. There is really no way to get on Suboxone treatment without financing. Patient will be strongly encouraged to follow-up with outpatient substance abuse treatment. [facility] would probably be his  best bet. No other psychiatric medicine indicated or likely to be of any help at this point. I will follow up tomorrow if he is still in the hospital.  Alethia Berthold, MD 08/21/2016 5:58 PM

## 2016-08-21 NOTE — Clinical Social Work Note (Signed)
Clinical Social Work Assessment  Patient Details  Name: Paul Day MRN: [id number] Date of Birth: 09-06-1956  Date of referral:  08/21/16               Reason for consult:  Substance Use/ETOH Abuse                Permission sought to share information with:    Permission granted to share information::     Name::        Agency::     Relationship::     Contact Information:     Housing/Transportation Living arrangements for the past 2 months:  [place], Homeless, [place] of Information:  Patient, Adult Children Patient Interpreter Needed:  None Criminal Activity/Legal Involvement Pertinent to Current Situation/Hospitalization:  No - Comment as needed Significant Relationships:  Adult Children, Friend Lives with:  Adult Children, Friends Do you feel safe going back to the place where you live?  Yes Need for family participation in patient care:  No (Coment)  Care giving concerns:  Patient and [name] are currently staying at a friends house in [place]. Patient and his son have stayed in the homeless shelter and on the streets the past few months.     Social Worker assessment / plan:  Holiday representative (CSW) received verbal substance abuse consult from [facility]A CSW. CSW met with patient and his son Paul Day at bedside. Son Paul Day was discharged from 1A today and went to patient's room. CSW introduced self and explained role of CSW department. Per patient him and his son are staying with a friend in [place] and they are planning on going back there when patient is discharged. Patient requested for his son to stay during the assessment. 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 Patient reported that he does not have a full time job and no insurance. Patient reported that he has been using heroin for the past 3 to 4 years. Patient reported that he "shoots up" and would never use herion through an IV. Patient denied other drug use and alcohol  use. Patient's drug screen on admission was positive for opiates only. CSW provided patient with residential substance abuse treatment options and outpatient substance abuse resources including [facility] and [facility]. Per son he knows where [facility] is at. Patient reported that he does have transportation. CSW also provided patient with transportation, shelter and food resources. Per patient he is motivated to follow up and start treatment. Patient reported no other needs or concerns. CSW will continue to follow and assist as needed.       Employment status:  Systems developer information:  Self Pay (Medicaid Pending) PT Recommendations:  Not assessed at this time Information / Referral to community resources:  Residential Substance Abuse Treatment Options, Outpatient Substance Abuse Treatment Options  Patient/Family's Response to care:  Patient appears motivated to follow up and start substance abuse treatment.   Patient/Family's Understanding of and Emotional Response to Diagnosis, Current Treatment, and Prognosis:  Patient and son were pleasant and thanked CSW for assistance.   Emotional Assessment Appearance:  Appears older than stated age Attitude/Demeanor/Rapport:    Affect (typically observed):  Accepting, Adaptable, Pleasant Orientation:  Oriented to Self, Oriented to Place, Oriented to  Time, Oriented to Situation Alcohol / Substance use:  Illicit Drugs Psych involvement (Current and /or in the community):  Yes (Comment) (Dr. Weber Cooks is following patient while in the hospital. )  Discharge Needs  Concerns to be addressed:  Discharge Planning Concerns Readmission within the last 30 days:  No Current discharge risk:  Substance Abuse Barriers to Discharge:  Continued Medical Work up   [employer], Paul Maggio, LCSW 08/21/2016, 12:10 PM

## 2016-08-21 NOTE — Evaluation (Signed)
Physical Therapy Evaluation Patient Details Name: Paul Day MRN: [id number] DOB: [birth year] Today's Date: 08/21/2016   History of Present Illness  Pt admitted for opiate withdrawl. Pt with history of heroin and cocaine abuse. Pt came to ER for possible detox. Pt with complaints of body cramps and anxiety. Recently complaints of L LE pain, however imaging negative for injury.   Clinical Impression  Pt is a pleasant 60 year old male who was admitted for opiate withdrawl. Pt performs bed mobility with independence, transfers with mod I, and ambulation with cga and RW. Secondary to pain, pt has increased difficulty WBing through L LE and prefers to hop on R LE. No swelling or redness noted, however pain increased with WB and movement. Suspect muscle strain in L ankle. Pt demonstrates deficits with ambulation/strength/endurance. Pt needs a RW for mobility at this time secondary to pain. Would benefit from skilled PT to address above deficits and promote optimal return to PLOF.      Follow Up Recommendations Outpatient PT    Equipment Recommendations  Rolling walker with 5" wheels    Recommendations for Other Services       Precautions / Restrictions Precautions Precautions: Fall Restrictions Weight Bearing Restrictions: No      Mobility  Bed Mobility Overal bed mobility: Independent             General bed mobility comments: safe technique performed with pt able to sit with independence once at side of bed  Transfers Overall transfer level: Modified independent Equipment used: Rolling walker (2 wheeled)             General transfer comment: safe technique with cues to push from seated surface. Once standing, pt able to stand, however unable to put weight on L foot.   Ambulation/Gait Ambulation/Gait assistance: Min guard Ambulation Distance (Feet): 40 Feet Assistive device: Rolling walker (2 wheeled) Gait Pattern/deviations: Step-through pattern     General  Gait Details: ambulated using RW and safe technique. Pt unable to put weight through L foot during ambulation and prefers to "hop" on R foot. Safe technique performed with good use of weight bearing through B UEs. Pt fatigues quickly with hopping.  Stairs            Wheelchair Mobility    Modified Rankin (Stroke Patients Only)       Balance Overall balance assessment: Needs assistance Sitting-balance support: Feet supported Sitting balance-Leahy Scale: Normal     Standing balance support: Bilateral upper extremity supported Standing balance-Leahy Scale: Good                               Pertinent Vitals/Pain Pain Assessment: Faces Faces Pain Scale: Hurts little more Pain Location: L ankle Pain Descriptors / Indicators: Discomfort;Dull Pain Intervention(s): Limited activity within patient's tolerance    Home Living Family/patient expects to be discharged to:: Private residence Living Arrangements: Non-relatives/Friends Available Help at Discharge: Friend(s) Type of Home: Homeless           Additional Comments: Pt currently homeless, per chart, however plans to stay with friends in [place]. No details given about home layout at this time.    Prior Function Level of Independence: Independent               Hand Dominance        Extremity/Trunk Assessment   Upper Extremity Assessment: Overall WFL for tasks assessed  Lower Extremity Assessment: Generalized weakness (L LE grossly 4/5; R LE grossly 5/5)         Communication   Communication: No difficulties  Cognition Arousal/Alertness: Awake/alert Behavior During Therapy: WFL for tasks assessed/performed Overall Cognitive Status: Within Functional Limits for tasks assessed                      General Comments      Exercises Other Exercises Other Exercises: Seated ther-ex performed on L LE including ankle pumps and LAQ. All ther-ex performed x 10 reps with cga  and correct technique. Pt educated on continued ther-ex for strengthening on  L LE.   Assessment/Plan    PT Assessment Patient needs continued PT services  PT Problem List Decreased strength;Decreased balance;Decreased mobility;Pain          PT Treatment Interventions Gait training;DME instruction;Therapeutic exercise;Balance training    PT Goals (Current goals can be found in the Care Plan section)  Acute Rehab PT Goals Patient Stated Goal: to get stronger and decrease pain PT Goal Formulation: With patient Time For Goal Achievement: 09/04/16 Potential to Achieve Goals: Good    Frequency Min 2X/week   Barriers to discharge        Co-evaluation               End of Session Equipment Utilized During Treatment: Gait belt Activity Tolerance: Patient limited by pain Patient left: in bed Nurse Communication: Mobility status         Time: 1418-1430 PT Time Calculation (min) (ACUTE ONLY): 12 min   Charges:   PT Evaluation $PT Eval Low Complexity: 1 Procedure PT Treatments $Therapeutic Exercise: 8-22 mins   PT G Codes:        [name] 09/18/2016, 4:08 PM  [name], PT, DPT [phone]

## 2016-08-21 NOTE — Progress Notes (Signed)
Patient transferred to 2-A. Pt aware of transfer. Report called to [place], Charity fundraiser.  Left floor via bed with all personal belongings.

## 2016-08-21 NOTE — Progress Notes (Signed)
CONSULT NOTE  Pharmacy has been consulted to assist in electrolyte management in this 58 yoM admitted on 9/19 with complaints of heroin withdrawal and bilateral lower extremity pain.  Electrolytes:  All electrolytes WNL. No supplementation needed at this time.  Will F/U with am labs.     Allergies  Allergen Reactions  . Norco [Hydrocodone-Acetaminophen] Itching and Rash    Patient Measurements: Height: 5\' 6"  (167.6 cm) Weight: 101 lb (45.8 kg) IBW/kg (Calculated) : 63.8   Vital Signs: Temp: 98.4 F (36.9 C) (09/21 0820) Temp Source: Oral (09/21 0820) BP: 113/62 (09/21 0820) Pulse Rate: 80 (09/21 0820) Intake/Output from previous day: 09/20 0701 - 09/21 0700 In: 2741.9 [P.O.:260; I.V.:2381.9; IV Piggyback:100] Out: 602 [Urine:601; Stool:1] Intake/Output from this shift: Total I/O In: 50 [IV Piggyback:50] Out: 775 [Urine:775]  Labs:  Recent Labs  08/19/16 0844 08/20/16 0402  WBC 6.8 6.8  HGB 14.9 14.6  HCT 43.6 42.5  PLT 169 169     Recent Labs  08/19/16 0844 08/20/16 0402 08/20/16 2326 08/21/16 0434  NA 137 139  --  140  K 3.6 3.2* 4.5 4.4  CL 101 106  --  110  CO2 26 24  --  27  GLUCOSE 91 143*  --  116*  BUN 14 14  --  20  CREATININE 0.56* 1.03  --  0.73  CALCIUM 9.8 9.3  --  9.3  MG 1.8  --   --  1.7  PROT 8.2* 7.3  --   --   ALBUMIN 4.1 3.7  --   --   AST 35 29  --   --   ALT 27 25  --   --   ALKPHOS 173* 149*  --   --   BILITOT 0.6 0.6  --   --    Estimated Creatinine Clearance: 62.8 mL/min (by C-G formula based on SCr of 0.73 mg/dL).    Recent Labs  08/19/16 1522  GLUCAP 101*    Medications:  Scheduled:  . atorvastatin  80 mg Oral QPM  . buprenorphine  4 mg Sublingual BID  . Chlorhexidine Gluconate Cloth  6 each Topical Q0600  . enoxaparin (LOVENOX) injection  40 mg Subcutaneous Q24H  . lidocaine  1 application Topical TID  . methimazole  20 mg Oral Daily  . mupirocin ointment  1 application Nasal BID  . propranolol  80 mg  Oral TID  . sodium chloride flush  3 mL Intravenous Q12H   Infusions:     08/21/16, PharmD Clinical Pharmacist 08/21/2016 10:30 AM

## 2016-08-22 LAB — THYROID PANEL
Free Thyroxine Index: 6.3 — ABNORMAL HIGH (ref 1.2–4.9)
T3 Uptake Ratio: 48 % — ABNORMAL HIGH (ref 24–39)
T4, Total: 13.1 ug/dL — ABNORMAL HIGH (ref 4.5–12.0)

## 2016-08-22 MED ORDER — METHIMAZOLE 10 MG PO TABS: 20.0000 mg | ORAL_TABLET | Freq: Every day | ORAL | 0 refills | Status: DC

## 2016-08-22 MED ORDER — PROPRANOLOL HCL 40 MG PO TABS: 40.0000 mg | ORAL_TABLET | Freq: Three times a day (TID) | ORAL | 0 refills | Status: DC

## 2016-08-22 MED ORDER — IBUPROFEN 600 MG PO TABS: 600.0000 mg | ORAL_TABLET | Freq: Three times a day (TID) | ORAL | 0 refills | Status: DC | PRN

## 2016-08-22 MED ORDER — BUPRENORPHINE HCL 2 MG SL SUBL: SUBLINGUAL_TABLET | SUBLINGUAL | 0 refills | Status: DC

## 2016-08-22 NOTE — Progress Notes (Signed)
Patient ID: Paul Day, male   DOB: 06-04-1956, 60 y.o.   MRN: [id number] [facility] [place] [facility] was admitted to the Hospital on 08/19/2016 and Discharged  08/22/2016 and should be excused from work/school   for 4  days starting 08/19/2016 , may return to work/school without any restrictions.  Alford Highland M.D on 08/22/2016,at 8:39 AM  [facility] [place] at [facility]  [phone]

## 2016-08-22 NOTE — Discharge Summary (Signed)
[facility] [place] at [facility]   PATIENT NAME: Paul Day    MR#:  [id number]  DATE OF BIRTH:  [birth year]  DATE OF ADMISSION:  08/19/2016 ADMITTING PHYSICIAN: Katharina Caper, MD  DATE OF DISCHARGE: 08/22/2016 12:09 PM  PRIMARY CARE PHYSICIAN: [facility]   ADMISSION DIAGNOSIS:  Tachycardia [R00.0] Encephalopathy acute [G93.40] Heroin withdrawal (HCC) [F11.23]  DISCHARGE DIAGNOSIS:  Principal Problem:   Opiate withdrawal (HCC) Active Problems:   Malignant essential hypertension   Sinus tachycardia (HCC)   Opiate abuse, continuous   SECONDARY DIAGNOSIS:   Past Medical History:  Diagnosis Date  . Hyperlipidemia   . Myocardial infarct (HCC)   . Rheumatoid arthritis(714.0)     HOSPITAL COURSE:   1.  Her room when withdrawal with nausea vomiting diarrhea. I will prescribe Buprenorphine a taper for a few days supply. 2. Malignant hypertension on presentation likely secondary to heroin withdrawal. Blood pressure on the lower side. Decreased dose of propranolol. 3. Acute encephalopathy secondary to heroin withdrawal 4. Hyperthyroidism on methimazole and propranolol 5. Left anterior tibialis inflammation. Urine ibuprofen   DISCHARGE CONDITIONS:   Fair  CONSULTS OBTAINED:  Treatment Team:  Audery Amel, MD  DRUG ALLERGIES:   Allergies  Allergen Reactions  . Norco [Hydrocodone-Acetaminophen] Itching and Rash    DISCHARGE MEDICATIONS:   Discharge Medication List as of 08/22/2016 10:14 AM    START taking these medications   Details  buprenorphine (SUBUTEX) 2 MG SUBL SL tablet 2mg  sublingual twice a day for one day then 2mg  sublingual for two days then stop, Print    ibuprofen (ADVIL,MOTRIN) 600 MG tablet Take 1 tablet (600 mg total) by mouth every 8 (eight) hours as needed for mild pain or moderate pain (pain left shin)., Starting Fri 08/22/2016, No Print      CONTINUE these medications which have CHANGED   Details  methimazole  (TAPAZOLE) 10 MG tablet Take 2 tablets (20 mg total) by mouth daily., Starting Fri 08/22/2016, Until Sun 09/21/2016, Print    propranolol (INDERAL) 40 MG tablet Take 1 tablet (40 mg total) by mouth 3 (three) times daily., Starting Fri 08/22/2016, Until Sun 09/21/2016, Print      STOP taking these medications     atorvastatin (LIPITOR) 80 MG tablet          DISCHARGE INSTRUCTIONS:   [facility]  If you experience worsening of your admission symptoms, develop shortness of breath, life threatening emergency, suicidal or homicidal thoughts you must seek medical attention immediately by calling 911 or calling your MD immediately  if symptoms less severe.  You Must read complete instructions/literature along with all the possible adverse reactions/side effects for all the Medicines you take and that have been prescribed to you. Take any new Medicines after you have completely understood and accept all the possible adverse reactions/side effects.   Please note  You were cared for by a hospitalist during your hospital stay. If you have any questions about your discharge medications or the care you received while you were in the hospital after you are discharged, you can call the unit and asked to speak with the hospitalist on call if the hospitalist that took care of you is not available. Once you are discharged, your primary care physician will handle any further medical issues. Please note that NO REFILLS for any discharge medications will be authorized once you are discharged, as it is imperative that you return to your primary care  physician (or establish a relationship with a primary care physician if you do not have one) for your aftercare needs so that they can reassess your need for medications and monitor your lab values.    Today   CHIEF COMPLAINT:   Chief Complaint  Patient presents with  . Addiction Problem    HISTORY OF PRESENT ILLNESS:  Paul Day  is a 60 y.o. male  Presented with her when withdrawal nausea vomiting and diarrhea  VITAL SIGNS:  Blood pressure 120/70, pulse (!) 59, temperature 97.8 F (36.6 C), temperature source Oral, resp. rate 16, height 5\' 6"  (1.676 m), weight 45.8 kg (101 lb), SpO2 100 %.    PHYSICAL EXAMINATION:  GENERAL:  60 y.o.-year-old patient lying in the bed with no acute distress. Cachectic EYES: Pupils equal, round, reactive to light and accommodation. No scleral icterus. Extraocular muscles intact.  HEENT: Head atraumatic, normocephalic. Oropharynx and nasopharynx clear.  NECK:  Supple, no jugular venous distention. No thyroid enlargement, no tenderness.  LUNGS: decreased  breath sounds bilaterally, no wheezing, rales,rhonchi or crepitation. No use of accessory muscles of respiration.  CARDIOVASCULAR: S1, S2 normal. No murmurs, rubs, or gallops.  ABDOMEN: Soft, non-tender, non-distended. Bowel sounds present. No organomegaly or mass.  EXTREMITIES: No pedal edema, cyanosis, or clubbing.  NEUROLOGIC: Cranial nerves II through XII are intact. Muscle strength 5/5 in all extremities. Sensation intact. Gait not checked.  PSYCHIATRIC: The patient is alert and oriented x 3.  SKIN: No obvious rash, lesion, or ulcer.   DATA REVIEW:   CBC  Recent Labs Lab 08/20/16 0402  WBC 6.8  HGB 14.6  HCT 42.5  PLT 169    Chemistries   Recent Labs Lab 08/20/16 0402  08/21/16 0434  NA 139  --  140  K 3.2*  < > 4.4  CL 106  --  110  CO2 24  --  27  GLUCOSE 143*  --  116*  BUN 14  --  20  CREATININE 1.03  --  0.73  CALCIUM 9.3  --  9.3  MG  --   --  1.7  AST 29  --   --   ALT 25  --   --   ALKPHOS 149*  --   --   BILITOT 0.6  --   --   < > = values in this interval not displayed.  Cardiac Enzymes  Recent Labs Lab 08/20/16 0114  TROPONINI <0.03    Microbiology Results  Results for orders placed or performed during the hospital encounter of 08/19/16  MRSA PCR Screening     Status:  Abnormal   Collection Time: 08/19/16  3:29 PM  Result Value Ref Range Status   MRSA by PCR POSITIVE (A) NEGATIVE Final    Comment:        The GeneXpert MRSA Assay (FDA approved for NASAL specimens only), is one component of a comprehensive MRSA colonization surveillance program. It is not intended to diagnose MRSA infection nor to guide or monitor treatment for MRSA infections. CRITICAL RESULT CALLED TO, READ BACK BY AND VERIFIED WITH: JAIME SMITH GREGORY@0900  ON 08/20/16 BY [facility]     RADIOLOGY:  Dg Tibia/fibula Left  Result Date: 08/21/2016 CLINICAL DATA:  Patient with lower extremity pain for 4 hours. No known injury. Initial encounter. EXAM: LEFT TIBIA AND FIBULA - 2 VIEW COMPARISON:  None. FINDINGS: There is no evidence of fracture or other focal bone lesions. Soft tissues are unremarkable. IMPRESSION: No acute osseous abnormality. Electronically  Signed   By: Annia Belt M.D.   On: 08/21/2016 09:39    Management plans discussed with the patient, family and they are in agreement.  CODE STATUS:  Code Status History    Date Active Date Inactive Code Status Order ID Comments User Context   08/19/2016  3:24 PM 08/22/2016  3:17 PM Full Code [id number]  Katharina Caper, MD Inpatient   02/11/2016 10:37 PM 02/13/2016  6:13 PM Full Code [id number]  Oralia Manis, MD Inpatient      TOTAL TIME TAKING CARE OF THIS PATIENT: 35 minutes.    Alford Highland M.D on 08/22/2016 at 3:36 PM  Between 7am to 6pm - Pager - [phone]  After 6pm go to [email or web address] - password [email or web address] [facility]  [facility] Office  [phone]  CC: Primary care physician; [facility]

## 2016-08-22 NOTE — Progress Notes (Signed)
Patient clothing and wallet is reported missing , looked all over the room , ICU was check since pt was a transfer form their 2 night  Ago , but can't find it, only clothing was listed in pt's admission belonging, Jasmine December GO was made aware and a safety  zone report was submit.

## 2016-08-22 NOTE — Care Management (Signed)
Patient for discharge home today.  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  Says he has money to pay for his subutrex.  Faxed scripts for [facility]imazole and [facility]ol [facility].  Provided patient with applications for [facility].  Discussed follow up for his addictions and says he is through with cocaine and heroin.  "That is why I came here."  Says he can manage by himself.  Strongly encouraged patient to obtain support through [employer].  Discussed going to [employer].  Uses [employer] for transportation.  Verbalizes understanding to pick up medications today after discharge from the med clinic

## 2016-08-22 NOTE — Care Management (Signed)
Just as he was discharging, patient informed primary nurse that he could not go across the street to pick of meds from the [facility], and lost his wallet so he could not use [employer].  Then realized his son- Travor who was discharged from 1A on 9.21, had spent the night in patient's room.  CM obtained meds from the clinic for patient and [employer] bus ticket to patient and his son.

## 2016-08-22 NOTE — Progress Notes (Signed)
Patient is being discharge in a stable condition , summary and medication given, verbalized understanding ,left with son

## 2016-09-09 ENCOUNTER — Encounter: Payer: Self-pay | Admitting: *Deleted

## 2016-09-09 ENCOUNTER — Emergency Department: Payer: Self-pay

## 2016-09-09 ENCOUNTER — Emergency Department
Admission: EM | Admit: 2016-09-09 | Discharge: 2016-09-09 | Disposition: A | Discharge status: Other Acute Inpatient Hospital | Payer: Self-pay | Attending: Emergency Medicine | Admitting: Emergency Medicine

## 2016-09-09 DIAGNOSIS — J029 Acute pharyngitis, unspecified: Secondary | ICD-10-CM

## 2016-09-09 DIAGNOSIS — I252 Old myocardial infarction: Secondary | ICD-10-CM | POA: Insufficient documentation

## 2016-09-09 DIAGNOSIS — E0591 Thyrotoxicosis, unspecified with thyrotoxic crisis or storm: Secondary | ICD-10-CM | POA: Insufficient documentation

## 2016-09-09 DIAGNOSIS — F17210 Nicotine dependence, cigarettes, uncomplicated: Secondary | ICD-10-CM | POA: Insufficient documentation

## 2016-09-09 HISTORY — DX: Systemic involvement of connective tissue, unspecified: M35.9

## 2016-09-09 LAB — BASIC METABOLIC PANEL
ANION GAP: 14 (ref 5–15)
BUN: 20 mg/dL (ref 6–20)
CALCIUM: 10.1 mg/dL (ref 8.9–10.3)
CHLORIDE: 95 mmol/L — AB (ref 101–111)
CO2: 30 mmol/L (ref 22–32)
CREATININE: 0.8 mg/dL (ref 0.61–1.24)
GFR calc non Af Amer: >60 mL/min (ref >60)
Glucose, Bld: 138 mg/dL — ABNORMAL HIGH (ref 65–99)
Potassium: 3.3 mmol/L — ABNORMAL LOW (ref 3.5–5.1)
SODIUM: 139 mmol/L (ref 135–145)

## 2016-09-09 LAB — CBC WITH DIFFERENTIAL/PLATELET
BASOS ABS: 0 10*3/uL (ref 0–0.1)
BASOS PCT: 1 %
EOS PCT: 0 %
Eosinophils Absolute: 0 10*3/uL (ref 0–0.7)
HEMATOCRIT: 47.4 % (ref 40.0–52.0)
HEMOGLOBIN: 15.8 g/dL (ref 13.0–18.0)
Lymphocytes Relative: 12 %
Lymphs Abs: 1.1 10*3/uL (ref 1.0–3.6)
MCH: 26.3 pg (ref 26.0–34.0)
MCHC: 33.4 g/dL (ref 32.0–36.0)
MCV: 78.7 fL — ABNORMAL LOW (ref 80.0–100.0)
MONO ABS: 0.4 10*3/uL (ref 0.2–1.0)
Monocytes Relative: 5 %
NEUTROS ABS: 7.8 10*3/uL — AB (ref 1.4–6.5)
Neutrophils Relative %: 82 %
Platelets: 289 10*3/uL (ref 150–440)
RBC: 6.02 MIL/uL — ABNORMAL HIGH (ref 4.40–5.90)
RDW: 13.7 % (ref 11.5–14.5)
WBC: 9.4 10*3/uL (ref 3.8–10.6)

## 2016-09-09 LAB — TSH

## 2016-09-09 LAB — T4, FREE: FREE T4: 4.53 ng/dL — AB (ref 0.61–1.12)

## 2016-09-09 MED ORDER — METHIMAZOLE 10 MG PO TABS
20.0000 mg | ORAL_TABLET | Freq: Once | ORAL | Status: AC
Start: 2016-09-09 — End: 2016-09-09
  Administered 2016-09-09: 20 mg via ORAL
  Filled 2016-09-09: qty 2

## 2016-09-09 MED ORDER — DEXAMETHASONE SODIUM PHOSPHATE 10 MG/ML IJ SOLN
10.0000 mg | Freq: Once | INTRAMUSCULAR | Status: AC
  Administered 2016-09-09: 10 mg via INTRAVENOUS
  Filled 2016-09-09: qty 1

## 2016-09-09 MED ORDER — PROPRANOLOL HCL 60 MG PO TABS: 60.0000 mg | ORAL_TABLET | Freq: Once | ORAL | Status: DC

## 2016-09-09 MED ORDER — SODIUM CHLORIDE 0.9 % IV BOLUS (SEPSIS)
1000.0000 mL | Freq: Once | INTRAVENOUS | Status: AC
  Administered 2016-09-09: 1000 mL via INTRAVENOUS

## 2016-09-09 MED ORDER — PROPRANOLOL HCL 40 MG PO TABS
40.0000 mg | ORAL_TABLET | Freq: Three times a day (TID) | ORAL | Status: DC
  Administered 2016-09-09: 40 mg via ORAL
  Filled 2016-09-09: qty 1

## 2016-09-09 MED ORDER — SODIUM CHLORIDE 0.9 % IV SOLN: 1.5000 g | Freq: Once | INTRAVENOUS | Status: DC

## 2016-09-09 MED ORDER — SODIUM CHLORIDE 0.9 % IV SOLN
Freq: Once | INTRAVENOUS | Status: AC
  Administered 2016-09-09: 22:00:00 via INTRAVENOUS

## 2016-09-09 MED ORDER — PROPRANOLOL HCL 1 MG/ML IV SOLN
1.0000 mg | Freq: Once | INTRAVENOUS | Status: AC
  Administered 2016-09-09: 1 mg via INTRAVENOUS
  Filled 2016-09-09: qty 1

## 2016-09-09 MED ORDER — ONDANSETRON HCL 4 MG/2ML IJ SOLN
4.0000 mg | Freq: Once | INTRAMUSCULAR | Status: AC
  Administered 2016-09-09: 4 mg via INTRAVENOUS
  Filled 2016-09-09: qty 2

## 2016-09-09 MED ORDER — IOPAMIDOL (ISOVUE-300) INJECTION 61%
75.0000 mL | Freq: Once | INTRAVENOUS | Status: AC | PRN
  Administered 2016-09-09: 75 mL via INTRAVENOUS

## 2016-09-09 NOTE — ED Notes (Signed)
Pt vomiting on the floor.

## 2016-09-09 NOTE — ED Triage Notes (Addendum)
Pt arrives with complaints of sore throat, states hx of thyroid problems and now states it is difficult to talk, states he has been unable to eat or drink due to the pain, pt whispering upon arrival, resp even and unlabored, o2 sat WNL, EDP at bedside, states he has also been throwing up blood, states abd pain

## 2016-09-09 NOTE — ED Provider Notes (Addendum)
[facility] Emergency Department Provider Note  ____________________________________________  Time seen: Approximately 6:34 PM  I have reviewed the triage vital signs and the nursing notes.   HISTORY  Chief Complaint Sore Throat  Level 5 caveat:  Portions of the history and physical were unable to be obtained due to aphonia   HPI Paul Day is a 60 y.o. male history of opiate abuse, hyperthyroidism, rheumatoid arthritis, CAD, hyperlipidemia who presents for evaluation of sore throat and difficulty speaking. In triage patient was complaining of sore throat and difficulty talking. Has been unable to eat or drink due to pain. He was whispering in triage but unable to speak when I saw him in the room. He reports that he is feeling sick. No vomiting, diarrhea, CP, SOB.   Past Medical History:  Diagnosis Date  . Collagen vascular disease (HCC)   . Hyperlipidemia   . Myocardial infarct   . Rheumatoid arthritis(714.0)     Patient Active Problem List   Diagnosis Date Noted  . Opiate withdrawal (HCC) 08/20/2016  . Opiate abuse, continuous 08/20/2016  . Malignant essential hypertension 08/19/2016  . Sinus tachycardia 08/19/2016  . Tachycardia 02/13/2016  . Unstable angina (HCC) 02/11/2016  . CAP (community acquired pneumonia) 02/11/2016  . Severe sepsis (HCC) 02/11/2016  . Rheumatoid arthritis [facility])     Past Surgical History:  Procedure Laterality Date  . LEG SURGERY Right     Prior to Admission medications   Medication Sig Start Date End Date Taking? Authorizing Provider  buprenorphine (SUBUTEX) 2 MG SUBL SL tablet 2mg  sublingual twice a day for one day then 2mg  sublingual for two days then stop 08/22/16   , MD  ibuprofen (ADVIL,MOTRIN) 600 MG tablet Take 1 tablet (600 mg total) by mouth every 8 (eight) hours as needed for mild pain or moderate pain (pain left shin). 08/22/16   Alford Highland, MD  methimazole (TAPAZOLE) 10 MG tablet  Take 2 tablets (20 mg total) by mouth daily. 08/22/16 09/21/16  08/24/16, MD  propranolol (INDERAL) 40 MG tablet Take 1 tablet (40 mg total) by mouth 3 (three) times daily. 08/22/16 09/21/16  08/24/16, MD    Allergies Norco [hydrocodone-acetaminophen]  History reviewed. No pertinent family history.  Social History Social History  Substance Use Topics  . Smoking status: Current Every Day Smoker    Packs/day: 1.50    Types: Cigarettes  . Smokeless tobacco: Never Used  . Alcohol use No    Review of Systems Constitutional: Negative for fever. Eyes: Negative for visual changes. ENT: + sore throat. Cardiovascular: Negative for chest pain. Respiratory: Negative for shortness of breath. Gastrointestinal: Negative for abdominal pain, vomiting or diarrhea. Genitourinary: Negative for dysuria. Musculoskeletal: Negative for back pain. Skin: Negative for rash. Neurological: Negative for headaches, weakness or numbness.  ____________________________________________   PHYSICAL EXAM:  VITAL SIGNS: ED Triage Vitals [09/09/16 1833]  Enc Vitals Group     BP (!) 143/77     Pulse Rate (!) 137     Resp 18     Temp 98 F (36.7 C)     Temp src      SpO2 97 %     Weight      Height      Head Circumference      Peak Flow      Pain Score      Pain Loc      Pain Edu?      Excl. in GC?  Constitutional: Alert and oriented, cachectic, no distress. HEENT:      Head: Normocephalic and atraumatic.         Eyes: Conjunctivae are normal. Sclera is non-icteric. EOMI. PERRL      Mouth/Throat: Mucous membranes are moist. Airway patents. Normal tonsils with no hypertrophy, no peritonsillar abscess      Neck: Supple with no signs of meningismus. No stridor. No mass palpable Cardiovascular: Tachycardic with regular rhythm. No murmurs, gallops, or rubs. 2+ symmetrical distal pulses are present in all extremities. No JVD. Respiratory: Normal respiratory effort. Lungs are clear to  auscultation bilaterally. No wheezes, crackles, or rhonchi.  Gastrointestinal: Soft, non tender, and non distended with positive bowel sounds. No rebound or guarding. Musculoskeletal: Nontender with normal range of motion in all extremities. No edema, cyanosis, or erythema of extremities. Neurologic: Normal speech and language. Face is symmetric. Moving all extremities. No gross focal neurologic deficits are appreciated. Skin: Skin is warm, dry and intact. No rash noted. Psychiatric: Mood and affect are normal. Speech and behavior are normal.  ____________________________________________   LABS (all labs ordered are listed, but only abnormal results are displayed)  Labs Reviewed  CBC WITH DIFFERENTIAL/PLATELET - Abnormal; Notable for the following:       Result Value   RBC 6.02 (*)    MCV 78.7 (*)    Neutro Abs 7.8 (*)    All other components within normal limits  BASIC METABOLIC PANEL - Abnormal; Notable for the following:    Potassium 3.3 (*)    Chloride 95 (*)    Glucose, Bld 138 (*)    All other components within normal limits  TSH - Abnormal; Notable for the following:    TSH <0.010 (*)    All other components within normal limits  T4, FREE - Abnormal; Notable for the following:    Free T4 4.53 (*)    All other components within normal limits  CULTURE, GROUP A STREP [facility])   ____________________________________________  EKG  ED ECG REPORT I, Nita Sickle, the attending physician, personally viewed and interpreted this ECG.  Sinus tachycardia, rate of 118, normal intervals, normal axis, no ST elevations or depressions. ____________________________________________  RADIOLOGY  CT neck: Negative for airway compromise.  Small superficial low-density cystic structures in the posterior nasopharynx likely benign cysts. Acute infection also a possibility.  Severe apical emphysema  Negative for mass or adenopathy.  Mild fractures of T3, T4, and T5 appear chronic.  Correlate with history. ____________________________________________   PROCEDURES  Procedure(s) performed: None Procedures Critical Care performed:  None ____________________________________________   INITIAL IMPRESSION / ASSESSMENT AND PLAN / ED COURSE   60 y.o. male history of opiate abuse, hyperthyroidism, rheumatoid arthritis, CAD, hyperlipidemia who presents for evaluation of sore throat and difficulty speaking now aphonic during my evaluation. Airways patent, no tonsillar hypertrophy or exudates, no evidence of peritonsillar abscess, no meningeal signs, no masses palpable on the neck, no stridor, moving good air bilaterally. Patient is tachycardic but afebrile. Will give IVF and decadron. No current signs of impending airway at this time. Will get labs and CT neck to rule out mass.   Clinical Course  Comment By Time  Patient has been unable to tolerate his meds including propranolol and methimazole. He is currently tachycardic but afebrile. TSH < 0.010 with free T4 4.53 concerning for thyroid storm in the setting of medication non compliance. Will give propranolol and methimazole. CT pending.  Nita Sickle, MD 10/10 [phone]  Discussed patient with Dr. Roselee Nova, Hospitalist  at [facility] who accepted patient. Waiting for bed placement.  Nita Sickle, MD 10/10 2101    _________________________ 8:22 PM on 09/09/2016 ----------------------------------------- CT negative for mass, abscess, or airway compromise. Patient is now able to speak and reports sore throat feels better after decadron. Patient had episode of vomiting. Changed propranolo to IV formulation, Zofran and IVF given. Methimazole will be given in 20 min. Will admit to hospitalist.   Pertinent labs & imaging results that were available during my care of the patient were reviewed by me and considered in my medical decision making (see chart for details).    ____________________________________________   FINAL CLINICAL  IMPRESSION(S) / ED DIAGNOSES  Final diagnoses:  Sore throat  Thyrotoxicosis with thyrotoxic crisis, unspecified thyrotoxicosis type      NEW MEDICATIONS STARTED DURING THIS VISIT:  New Prescriptions   No medications on file     Note:  This document was prepared using Dragon voice recognition software and may include unintentional dictation errors.    Nita Sickle, MD 09/09/16 [id number]    Nita Sickle, MD 09/09/16 2118

## 2016-09-09 NOTE — ED Notes (Signed)
Pt vomited.   

## 2016-09-12 LAB — CULTURE, GROUP A STREP (THRC)

## 2017-08-27 ENCOUNTER — Encounter: Payer: Self-pay | Admitting: Emergency Medicine

## 2017-08-27 ENCOUNTER — Emergency Department
Admission: EM | Admit: 2017-08-27 | Discharge: 2017-08-27 | Disposition: A | Discharge status: Home / Self Care | Payer: Self-pay | Attending: Emergency Medicine | Admitting: Emergency Medicine

## 2017-08-27 DIAGNOSIS — F17210 Nicotine dependence, cigarettes, uncomplicated: Secondary | ICD-10-CM | POA: Insufficient documentation

## 2017-08-27 DIAGNOSIS — Z8673 Personal history of transient ischemic attack (TIA), and cerebral infarction without residual deficits: Secondary | ICD-10-CM | POA: Insufficient documentation

## 2017-08-27 DIAGNOSIS — Z79899 Other long term (current) drug therapy: Secondary | ICD-10-CM | POA: Insufficient documentation

## 2017-08-27 DIAGNOSIS — K029 Dental caries, unspecified: Secondary | ICD-10-CM | POA: Insufficient documentation

## 2017-08-27 MED ORDER — LIDOCAINE VISCOUS 2 % MT SOLN
15.0000 mL | Freq: Once | OROMUCOSAL | Status: AC
  Administered 2017-08-27: 15 mL via OROMUCOSAL

## 2017-08-27 MED ORDER — LIDOCAINE VISCOUS 2 % MT SOLN
OROMUCOSAL | Status: AC
  Filled 2017-08-27: qty 15

## 2017-08-27 MED ORDER — PENICILLIN V POTASSIUM 500 MG PO TABS: 500.0000 mg | ORAL_TABLET | Freq: Four times a day (QID) | ORAL | 0 refills | Status: DC

## 2017-08-27 MED ORDER — LIDOCAINE VISCOUS 2 % MT SOLN: OROMUCOSAL | 0 refills | Status: DC

## 2017-08-27 NOTE — ED Triage Notes (Signed)
Right side toothache and jaw pain since monday

## 2017-08-27 NOTE — ED Notes (Signed)
See triage note  States he has broken teeth and developed some gum swelling couple of days ago

## 2017-08-27 NOTE — ED Provider Notes (Signed)
[facility] Emergency Department Provider Note  ____________________________________________   First MD Initiated Contact with Patient 08/27/17 0725     (approximate)  I have reviewed the triage vital signs and the nursing notes.   HISTORY  Chief Complaint Dental Pain   HPI Paul Day is a 61 y.o. male is here with complaint of right-sided tooth pain that began 4 days ago. Patient is unaware of any fever or chills. He states he has not made an appointment with any dental clinics. Patient been taking over-the-counter medication without relief of his pain. Currently he rates pain as 10 over 10.   Past Medical History:  Diagnosis Date  . Collagen vascular disease (HCC)   . Hyperlipidemia   . Myocardial infarct (HCC)   . Rheumatoid arthritis(714.0)     Patient Active Problem List   Diagnosis Date Noted  . Opiate withdrawal (HCC) 08/20/2016  . Opiate abuse, continuous 08/20/2016  . Malignant essential hypertension 08/19/2016  . Sinus tachycardia 08/19/2016  . Tachycardia 02/13/2016  . Unstable angina (HCC) 02/11/2016  . CAP (community acquired pneumonia) 02/11/2016  . Severe sepsis (HCC) 02/11/2016  . Rheumatoid arthritis [facility])     Past Surgical History:  Procedure Laterality Date  . LEG SURGERY Right     Prior to Admission medications   Medication Sig Start Date End Date Taking? Authorizing Provider  buprenorphine (SUBUTEX) 2 MG SUBL SL tablet 2mg  sublingual twice a day for one day then 2mg  sublingual for two days then stop Patient not taking: Reported on 09/09/2016 08/22/16   11/09/2016, MD  ibuprofen (ADVIL,MOTRIN) 600 MG tablet Take 1 tablet (600 mg total) by mouth every 8 (eight) hours as needed for mild pain or moderate pain (pain left shin). Patient not taking: Reported on 09/09/2016 08/22/16   11/09/2016, MD  lidocaine (XYLOCAINE) 2 % solution Apply to cotton ball and apply to gums as needed for dental pain 08/27/17    Alford Highland, PA-C  methimazole (TAPAZOLE) 10 MG tablet Take 2 tablets (20 mg total) by mouth daily. Patient taking differently: Take 20 mg by mouth every morning.  08/22/16 09/21/16  08/24/16, MD  penicillin v potassium (VEETID) 500 MG tablet Take 1 tablet (500 mg total) by mouth 4 (four) times daily. 08/27/17   Alford Highland, PA-C  propranolol (INDERAL) 40 MG tablet Take 1 tablet (40 mg total) by mouth 3 (three) times daily. 08/22/16 09/21/16  08/24/16, MD    Allergies Norco [hydrocodone-acetaminophen]  No family history on file.  Social History Social History  Substance Use Topics  . Smoking status: Current Every Day Smoker    Packs/day: 1.50    Types: Cigarettes  . Smokeless tobacco: Never Used  . Alcohol use No    Review of Systems Constitutional: No fever/chills ENT: Positive for dental pain. Cardiovascular: Denies chest pain. Respiratory: Denies shortness of breath. Gastrointestinal:   No nausea, no vomiting. Neurological: Negative for headaches ___________________________________________   PHYSICAL EXAM:  VITAL SIGNS: ED Triage Vitals [08/27/17 0703]  Enc Vitals Group     BP      Pulse      Resp      Temp      Temp src      SpO2      Weight      Height      Head Circumference      Peak Flow      Pain Score 10     Pain Loc  Pain Edu?      Excl. in GC?    Constitutional: Alert and oriented. Well appearing and in no acute distress. Eyes: Conjunctivae are normal. Head: Atraumatic. Nose: No congestion/rhinnorhea. Mouth/Throat: Mucous membranes are moist.  Oropharynx non-erythematous. There are no teeth noted above the gumline on the mandible. The gum is mildly edematous. No obvious abscess or drainage noted. No new injury to teeth is noted. Neck: No stridor.   Hematological/Lymphatic/Immunilogical: No cervical lymphadenopathy. Cardiovascular: Normal rate, regular rhythm. Grossly normal heart sounds.  Good peripheral  circulation. Respiratory: Normal respiratory effort.  No retractions. Lungs CTAB. Musculoskeletal: Moves upper and lower extremities without any difficulty. Normal gait was noted. Neurologic:  Normal speech and language. No gross focal neurologic deficits are appreciated.  Skin:  Skin is warm, dry and intact.  Psychiatric: Mood and affect are normal. Speech and behavior are normal.  ____________________________________________   LABS (all labs ordered are listed, but only abnormal results are displayed)  Labs Reviewed - No data to display  PROCEDURES  Procedure(s) performed: None  Procedures  Critical Care performed: No  ____________________________________________   INITIAL IMPRESSION / ASSESSMENT AND PLAN / ED COURSE  Pertinent labs & imaging results that were available during my care of the patient were reviewed by me and considered in my medical decision making (see chart for details).  Patient is given prescription for Pen-Vee K 5 mg 4 times a day for 7 days along with prescription for viscous lidocaine to apply to his gums as needed for pain. He is encouraged to continue with his over-the-counter medication for pain. He was given a list of dental clinics in the area and encouraged to make an appointment.   ___________________________________________   FINAL CLINICAL IMPRESSION(S) / ED DIAGNOSES  Final diagnoses:  Chronic dental caries extending to pulp      NEW MEDICATIONS STARTED DURING THIS VISIT:  Discharge Medication List as of 08/27/2017  7:40 AM    START taking these medications   Details  lidocaine (XYLOCAINE) 2 % solution Apply to cotton ball and apply to gums as needed for dental pain, Print    penicillin v potassium (VEETID) 500 MG tablet Take 1 tablet (500 mg total) by mouth 4 (four) times daily., Starting Thu 08/27/2017, Print         Note:  This document was prepared using Dragon voice recognition software and may include unintentional  dictation errors.    Tommi Rumps, PA-C 08/27/17 1421    Rockne Menghini, MD 08/27/17 [phone]

## 2017-08-27 NOTE — Discharge Instructions (Signed)
Follow-up with one of the dentist listed on your discharge papers. Also information is given to you about the walk-in clinic at [facility]. Pen-Vee K is $11.39 at [employer]. There is a coupon for this medication and your discharge papers.  OPTIONS FOR DENTAL FOLLOW UP CARE  [place] [facility] - Local Safety Net Dental Clinics [email or web address]   [facility] [phone])  [facility] Ma [phone])  [place] [phone])  [facility] [phone])  [facility] [phone]) This clinic caters to the indigent population and is on a lottery system. Location: [employer] of [facility], [employer], [address] [place] Clinic Hours: Wednesdays from 6pm - 9pm, patients seen by a lottery system. For dates, call or go to [email or web address] Services: Cleanings, fillings and simple extractions. Payment Options: DENTAL WORK IS FREE OF CHARGE. Bring proof of income or support. Best way to get seen: Arrive at 5:15 pm - this is a lottery, NOT first come/first serve, so arriving earlier will not increase your chances of being seen.     [facility] [phone] Select option 1 for emergencies   Location: [facility], [place], [address], [place] Clinic Hours: No walk-ins accepted - call the day before to schedule an appointment. Check in times are 9:30 am and 1:30 pm. Services: Simple extractions, temporary fillings, pulpectomy/pulp debridement, uncomplicated abscess drainage. Payment Options: PAYMENT IS DUE AT THE TIME OF SERVICE.  Fee is usually $100-200, additional surgical procedures (e.g. abscess drainage) may be extra. Cash, checks, Visa/MasterCard accepted.  Can file Medicaid if patient is covered for dental - patient should call case worker to  check. No discount for [facility] patients. Best way to get seen: MUST call the day before and get onto the schedule. Can usually be seen the next 1-2 days. No walk-ins accepted.     [facility] [phone]   Location: [facility], [address] [place] Clinic Hours: M, W, Th, F 8am or 1:30pm, Tues 9a or 1:30 - first come/first served. Services: Simple extractions, temporary fillings, uncomplicated abscess drainage.  You do not need to be an [facility] resident. Payment Options: PAYMENT IS DUE AT THE TIME OF SERVICE. Dental insurance, otherwise sliding scale - bring proof of income or support. Depending on income and treatment needed, cost is usually $50-200. Best way to get seen: Arrive early as it is first come/first served.     [facility] [phone]   Location: [address] Clinic Hours: Mon-Thu 8a-5p Services: Most basic dental services including extractions and fillings. Payment Options: PAYMENT IS DUE AT THE TIME OF SERVICE. Sliding scale, up to 50% off - bring proof if income or support. Medicaid with dental option accepted. Best way to get seen: Call to schedule an appointment, can usually be seen within 2 weeks OR they will try to see walk-ins - show up at 8a or 2p (you may have to wait).     [facility] [phone] [place] RESIDENTS ONLY   Location: [facility], [address], [place], [place] [address] Clinic Hours: By appointment only. Monday - Thursday 8am-5pm, Friday 8am-12pm Services: Cleanings, fillings, extractions. Payment Options: PAYMENT IS DUE AT THE TIME OF SERVICE. Cash, Visa or MasterCard. Sliding scale - $30 minimum per service. Best way to get seen: Come in to office, complete packet and make an  appointment - need proof of income or support monies for each household member and proof of [facility]  residence. Usually takes about a month to get in.     [facility] [phone]   Location: [address] [facility] Clinic Hours: Walk-in Urgent Care Dental Services are offered Monday-Friday mornings only. The numbers of emergencies accepted daily is limited to the number of providers available. Maximum 15 - Mondays, Wednesdays & Thursdays Maximum 10 - Tuesdays & Fridays Services: You do not need to be a [facility] resident to be seen for a dental emergency. Emergencies are defined as pain, swelling, abnormal bleeding, or dental trauma. Walkins will receive x-rays if needed. NOTE: Dental cleaning is not an emergency. Payment Options: PAYMENT IS DUE AT THE TIME OF SERVICE. Minimum co-pay is $40.00 for uninsured patients. Minimum co-pay is $3.00 for Medicaid with dental coverage. Dental Insurance is accepted and must be presented at time of visit. Medicare does not cover dental. Forms of payment: Cash, credit card, checks. Best way to get seen: If not previously registered with the clinic, walk-in dental registration begins at 7:15 am and is on a first come/first serve basis. If previously registered with the clinic, call to make an appointment.     [facility] [phone] [place] RESIDENTS ONLY   Location: [address], [place], [place] Clinic Hours: Mon-Thu 10a-2p Services: Extractions only! Payment Options: FREE (donations accepted) - bring proof of income or support Best way to get seen: Call and schedule an appointment OR come at 8am on the 1st Monday of every month (except for holidays) when it is first come/first served.     [facility] [phone]   Location: [address] [place], [place] Clinic Hours: Friday mornings Services, Payment Options, Best way to get seen: Call for info

## 2018-02-20 IMAGING — CT CT HEAD W/O CM
5 of 7 series · 16 of 47 positions shown, 18 images · non-contrast
Comparison: None.

CLINICAL DATA: 60-year-old male undergoing heroin withdrawal.
Encephalopathy. Initial encounter.

EXAM:
CT HEAD WITHOUT CONTRAST
TECHNIQUE: Contiguous axial images were obtained from the base of the skull
through the vertex without intravenous contrast.

[Series 2: head wo · axial · 0.45mm/px · z∈[+477,+552]mm · 4 of 27 slices shown (1 of 2)]
[im 6/27  brain]
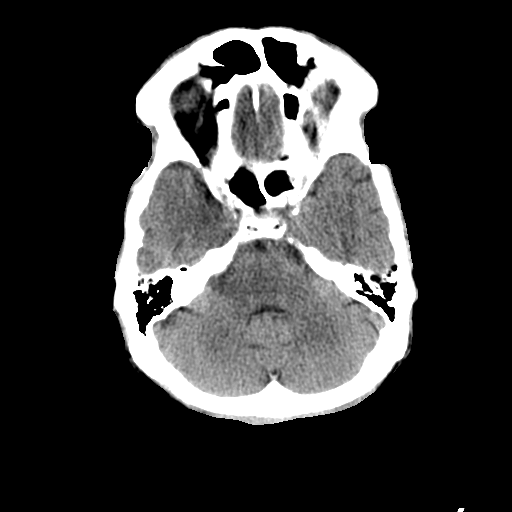
[im 11/27  brain]
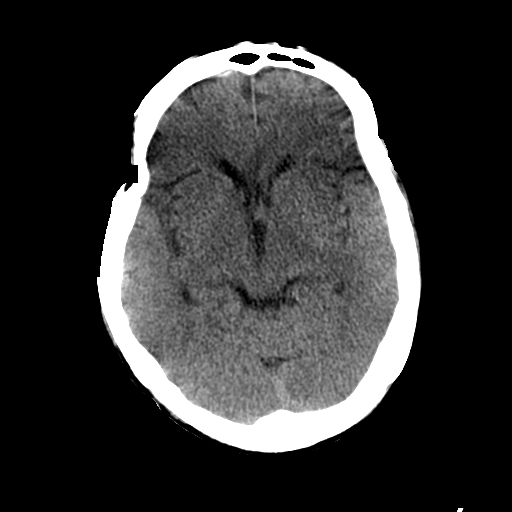
[im 16/27  brain]
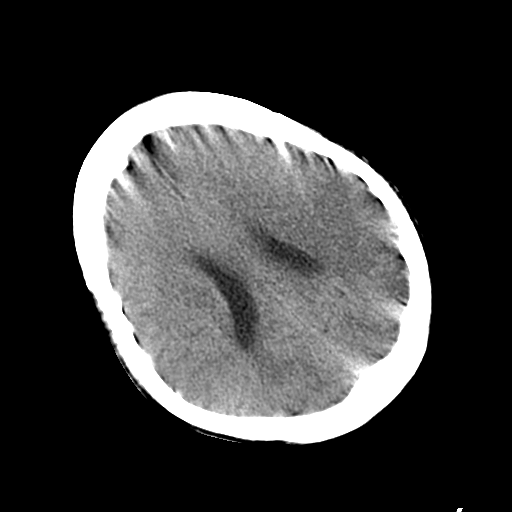
[im 21/27  brain]
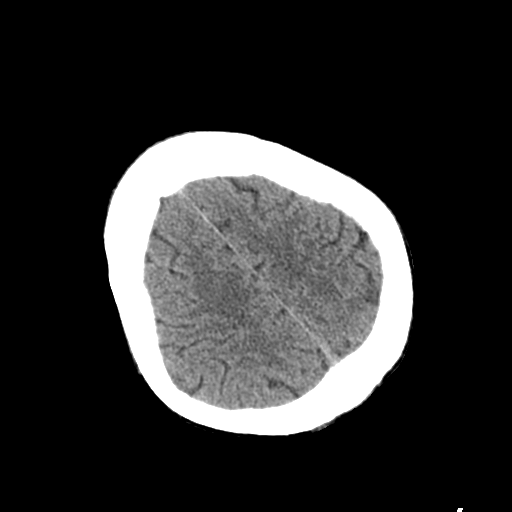

[Series 3: head wo · axial · 0.45mm/px · z∈[+472,+557]mm · 5 of 27 slices shown, 7 images (2 of 2)]
[im 5/27  brain]
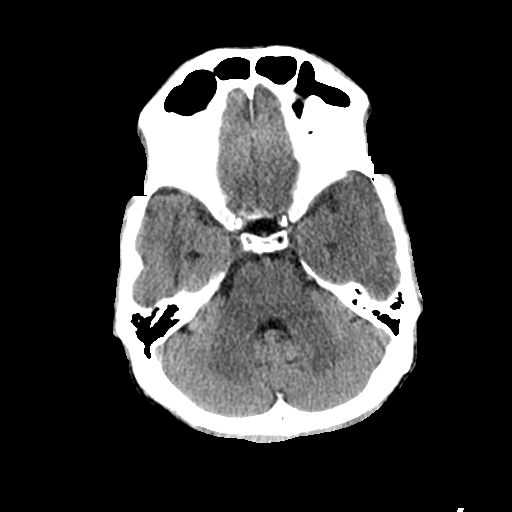
[im 5/27  bone]
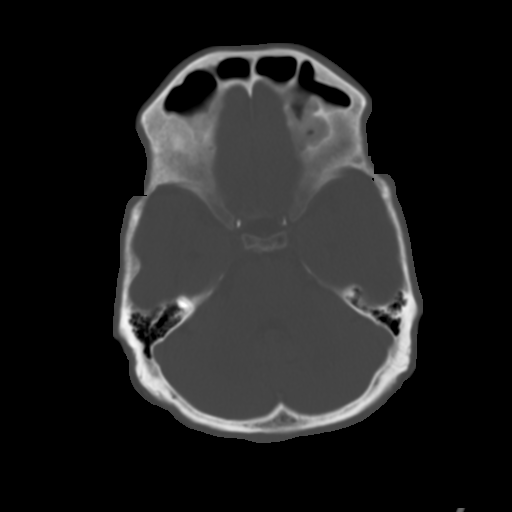
[im 9/27  brain]
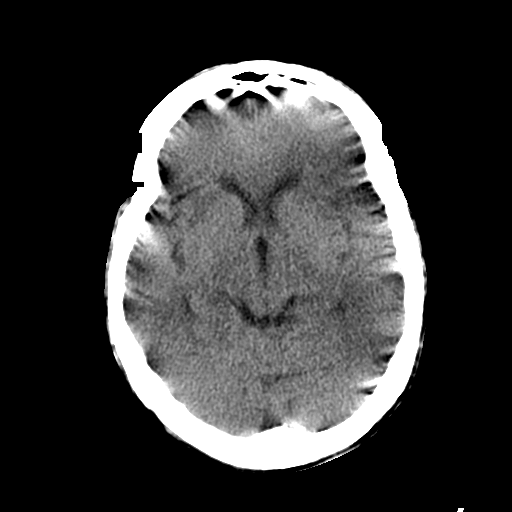
[im 14/27  brain]
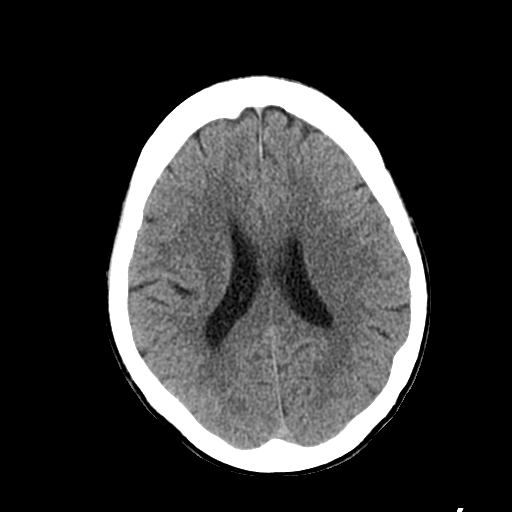
[im 18/27  brain]
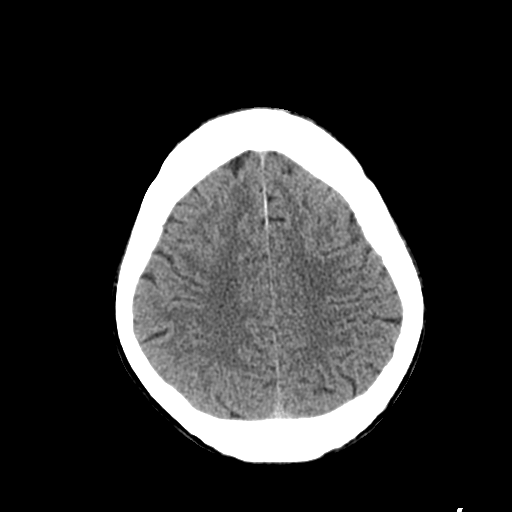
[im 22/27  brain]
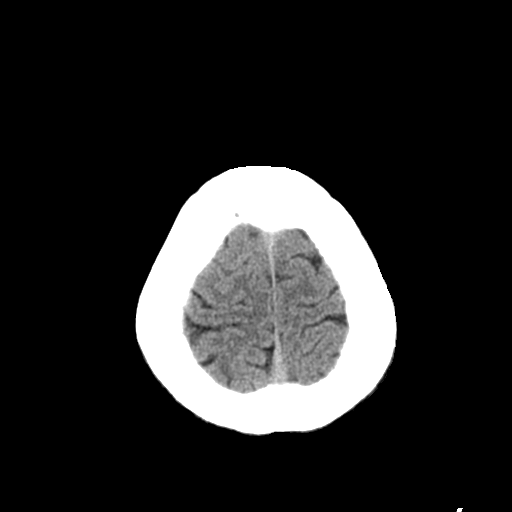
[im 22/27  bone]
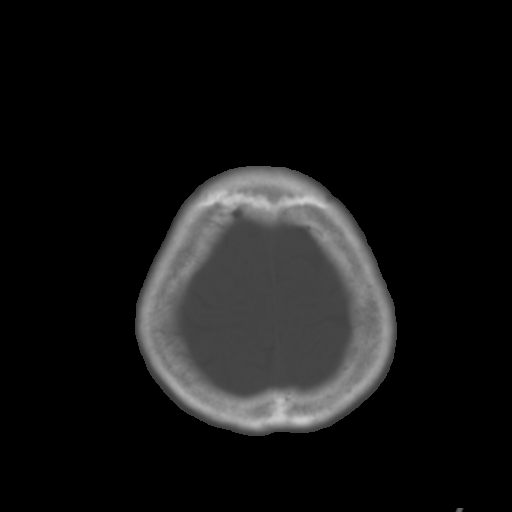

[Series 4: head bone · axial · 0.45mm/px · z∈[+460,+476]mm · 2 of 68 slices shown]
[im 5/68  bone]
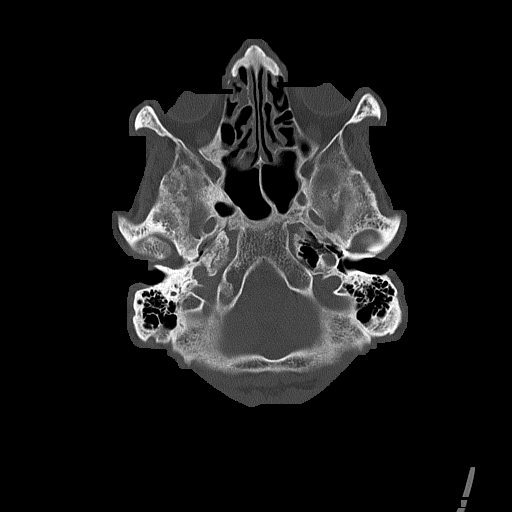
[im 13/68  bone]
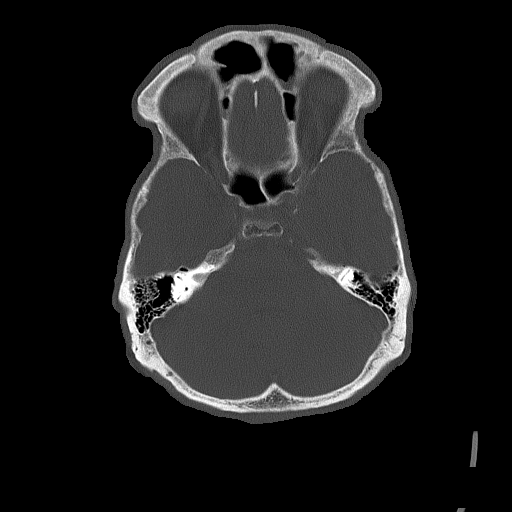

[Series 6: coronal soft tissue · coronal · 0.27mm/px · 3 of 61 slices shown]
[im 16/61  brain]
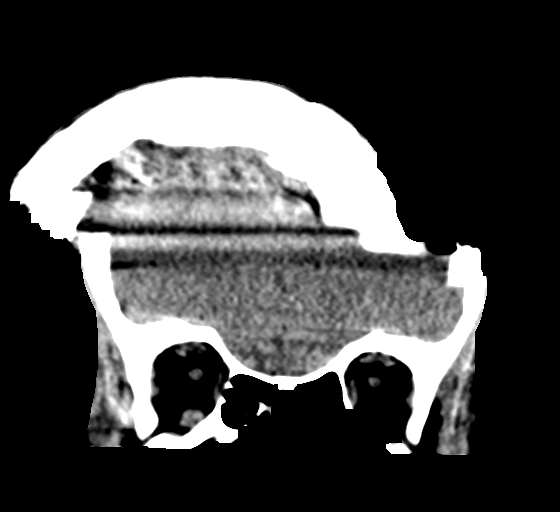
[im 31/61  brain]
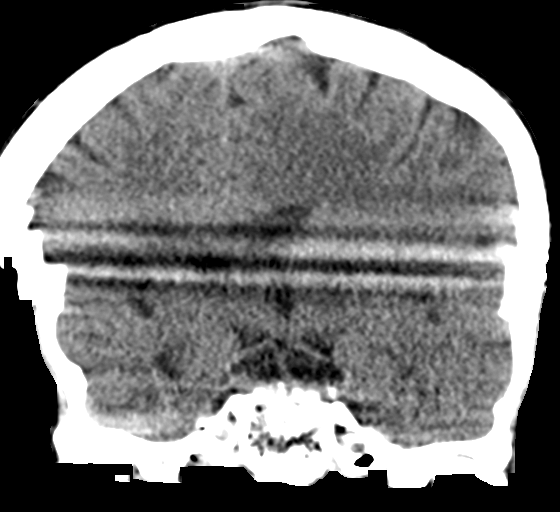
[im 46/61  brain]
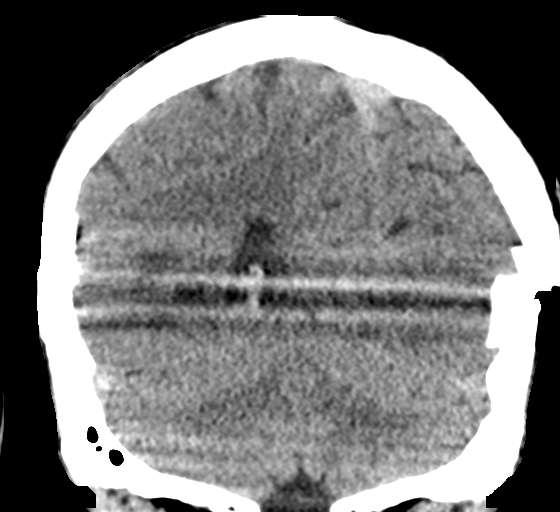

[Series 7: sagittal soft tissue · sagittal · 0.27mm/px · 2 of 51 slices shown]
[im 17/51  brain]
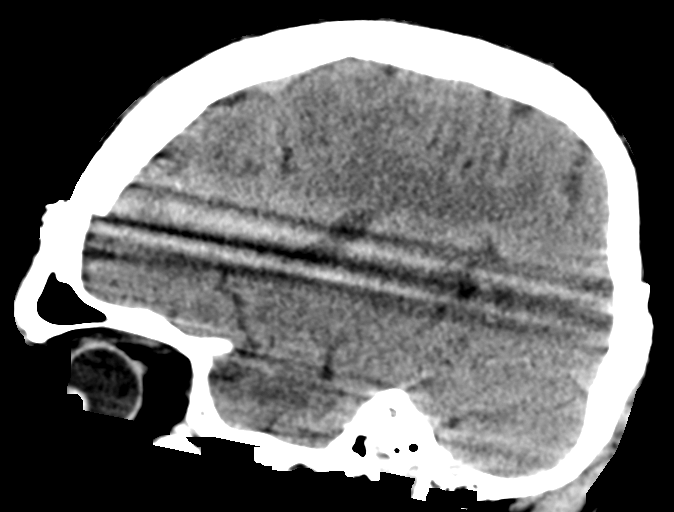
[im 34/51  brain]
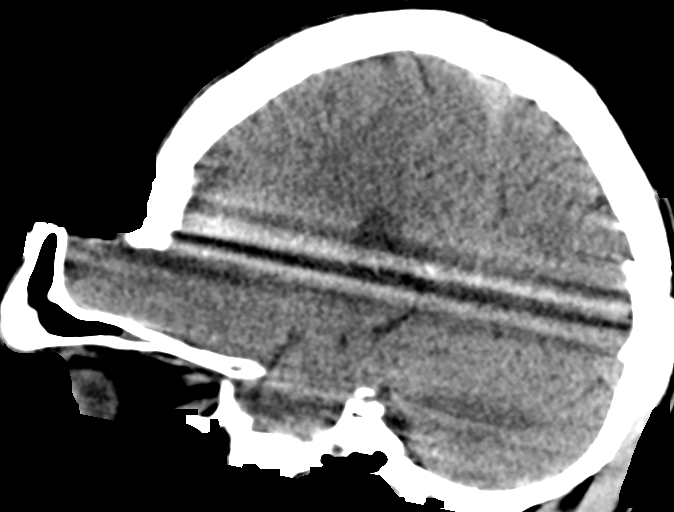

[16 of 47 positions shown; findings below may reference images not displayed]

FINDINGS: Study is intermittently degraded by motion artifact despite repeated
imaging attempts.

Brain: No midline shift, ventriculomegaly, mass effect, evidence of
mass lesion, intracranial hemorrhage or evidence of cortically based
acute infarction. Gray-white matter differentiation is within normal
limits throughout the brain.

Vascular: Calcified atherosclerosis at the skull base. No suspicious
intracranial vascular hyperdensity.

Skull: No acute osseous abnormality identified.

Sinuses/Orbits: Mild ethmoid and frontal sinus mucoperiosteal
thickening. Tympanic cavities and mastoids are clear.

Other: Negative orbit and scalp soft tissues.
IMPRESSION: 1. Negative for age non contrast CT appearance of the brain.
2. Chronic appearing ethmoid and frontal sinusitis.

## 2018-02-21 IMAGING — DX DG CHEST 1V PORT
1 series · 1 of 1 positions shown · non-contrast
Comparison: 05/24/2016

CLINICAL DATA: Tachycardia.

EXAM:
PORTABLE CHEST 1 VIEW

[chest ap]
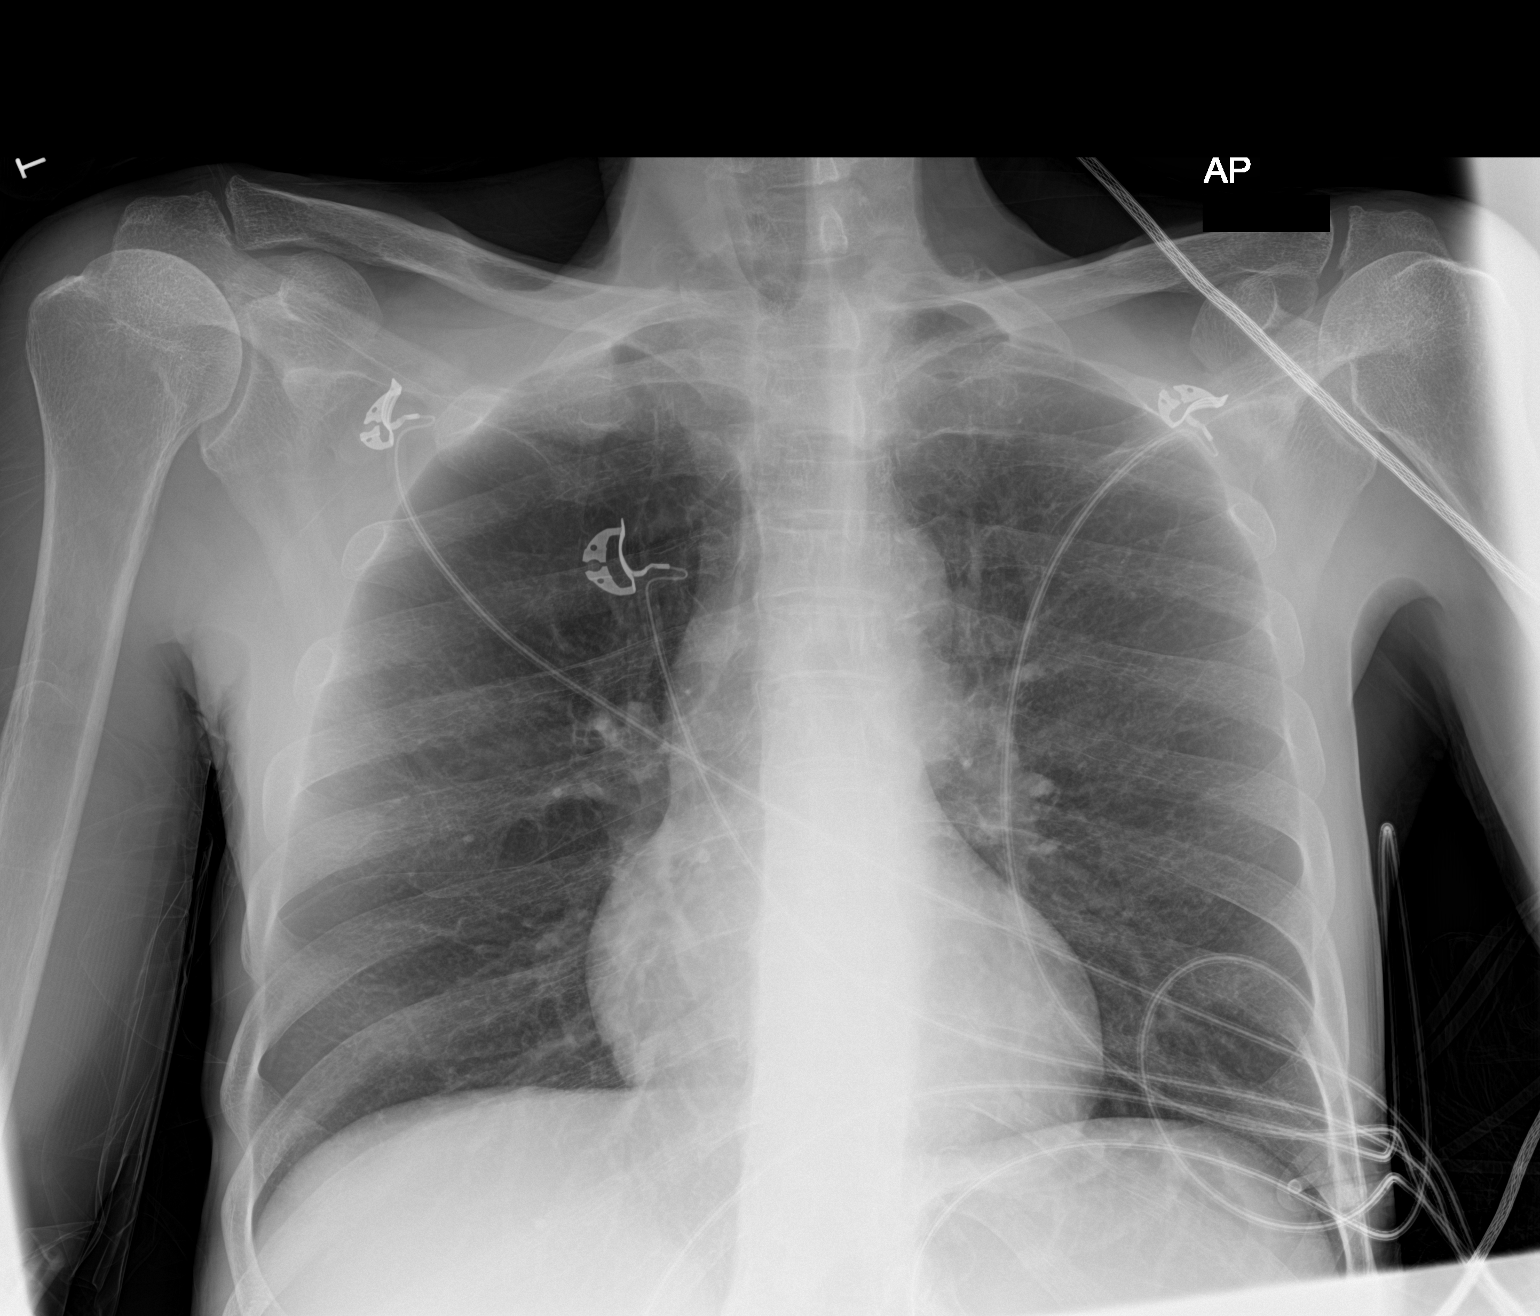

[1 of 1 positions shown; findings below may reference images not displayed]

FINDINGS: Mild hyperinflation. The heart size and mediastinal contours are
within normal limits. Both lungs are clear. The visualized skeletal
structures are unremarkable.
IMPRESSION: No active disease.

## 2018-03-26 ENCOUNTER — Emergency Department: Payer: Self-pay

## 2018-03-26 ENCOUNTER — Other Ambulatory Visit: Payer: Self-pay

## 2018-03-26 ENCOUNTER — Inpatient Hospital Stay
Admission: EM | Admit: 2018-03-26 | Discharge: 2018-03-30 | DRG: 371 | Disposition: A | Discharge status: Home / Self Care | Payer: Self-pay | Attending: Internal Medicine | Admitting: Internal Medicine

## 2018-03-26 ENCOUNTER — Encounter: Payer: Self-pay | Admitting: Emergency Medicine

## 2018-03-26 DIAGNOSIS — F1290 Cannabis use, unspecified, uncomplicated: Secondary | ICD-10-CM | POA: Diagnosis present

## 2018-03-26 DIAGNOSIS — J441 Chronic obstructive pulmonary disease with (acute) exacerbation: Secondary | ICD-10-CM | POA: Diagnosis present

## 2018-03-26 DIAGNOSIS — I252 Old myocardial infarction: Secondary | ICD-10-CM

## 2018-03-26 DIAGNOSIS — M069 Rheumatoid arthritis, unspecified: Secondary | ICD-10-CM | POA: Diagnosis present

## 2018-03-26 DIAGNOSIS — E785 Hyperlipidemia, unspecified: Secondary | ICD-10-CM | POA: Diagnosis present

## 2018-03-26 DIAGNOSIS — Z681 Body mass index (BMI) 19 or less, adult: Secondary | ICD-10-CM

## 2018-03-26 DIAGNOSIS — J440 Chronic obstructive pulmonary disease with acute lower respiratory infection: Secondary | ICD-10-CM | POA: Diagnosis present

## 2018-03-26 DIAGNOSIS — R000 Tachycardia, unspecified: Secondary | ICD-10-CM | POA: Diagnosis present

## 2018-03-26 DIAGNOSIS — R509 Fever, unspecified: Secondary | ICD-10-CM | POA: Diagnosis present

## 2018-03-26 DIAGNOSIS — A0472 Enterocolitis due to Clostridium difficile, not specified as recurrent: Principal | ICD-10-CM | POA: Diagnosis present

## 2018-03-26 DIAGNOSIS — E43 Unspecified severe protein-calorie malnutrition: Secondary | ICD-10-CM | POA: Diagnosis present

## 2018-03-26 DIAGNOSIS — K529 Noninfective gastroenteritis and colitis, unspecified: Secondary | ICD-10-CM

## 2018-03-26 DIAGNOSIS — Z885 Allergy status to narcotic agent status: Secondary | ICD-10-CM

## 2018-03-26 DIAGNOSIS — M549 Dorsalgia, unspecified: Secondary | ICD-10-CM | POA: Diagnosis present

## 2018-03-26 DIAGNOSIS — G894 Chronic pain syndrome: Secondary | ICD-10-CM | POA: Diagnosis present

## 2018-03-26 DIAGNOSIS — E860 Dehydration: Secondary | ICD-10-CM | POA: Diagnosis present

## 2018-03-26 DIAGNOSIS — F17210 Nicotine dependence, cigarettes, uncomplicated: Secondary | ICD-10-CM | POA: Diagnosis present

## 2018-03-26 DIAGNOSIS — F1490 Cocaine use, unspecified, uncomplicated: Secondary | ICD-10-CM | POA: Diagnosis present

## 2018-03-26 LAB — BASIC METABOLIC PANEL
ANION GAP: 11 (ref 5–15)
BUN: 22 mg/dL — ABNORMAL HIGH (ref 6–20)
CALCIUM: 8.5 mg/dL — AB (ref 8.9–10.3)
CO2: 26 mmol/L (ref 22–32)
Chloride: 97 mmol/L — ABNORMAL LOW (ref 101–111)
Creatinine, Ser: 0.64 mg/dL (ref 0.61–1.24)
GFR calc non Af Amer: >60 mL/min (ref >60)
Glucose, Bld: 185 mg/dL — ABNORMAL HIGH (ref 65–99)
Potassium: 3.3 mmol/L — ABNORMAL LOW (ref 3.5–5.1)
SODIUM: 134 mmol/L — AB (ref 135–145)

## 2018-03-26 LAB — CK: CK TOTAL: 28 U/L — AB (ref 49–397)

## 2018-03-26 LAB — CBC
HCT: 41 % (ref 40.0–52.0)
HCT: 35.9 % — ABNORMAL LOW (ref 40.0–52.0)
HEMOGLOBIN: 13.7 g/dL (ref 13.0–18.0)
Hemoglobin: 11.8 g/dL — ABNORMAL LOW (ref 13.0–18.0)
MCH: 25.8 pg — ABNORMAL LOW (ref 26.0–34.0)
MCH: 25.5 pg — ABNORMAL LOW (ref 26.0–34.0)
MCHC: 33.4 g/dL (ref 32.0–36.0)
MCHC: 33 g/dL (ref 32.0–36.0)
MCV: 77 fL — ABNORMAL LOW (ref 80.0–100.0)
MCV: 77.3 fL — ABNORMAL LOW (ref 80.0–100.0)
Platelets: 176 10*3/uL (ref 150–440)
Platelets: 141 10*3/uL — ABNORMAL LOW (ref 150–440)
RBC: 5.32 MIL/uL (ref 4.40–5.90)
RBC: 4.64 MIL/uL (ref 4.40–5.90)
RDW: 14.2 % (ref 11.5–14.5)
RDW: 13.8 % (ref 11.5–14.5)
WBC: 8.3 10*3/uL (ref 3.8–10.6)
WBC: 3.2 10*3/uL — AB (ref 3.8–10.6)

## 2018-03-26 LAB — COMPREHENSIVE METABOLIC PANEL
ALK PHOS: 165 U/L — AB (ref 38–126)
ALT: 31 U/L (ref 17–63)
ANION GAP: 11 (ref 5–15)
AST: 41 U/L (ref 15–41)
Albumin: 3.1 g/dL — ABNORMAL LOW (ref 3.5–5.0)
BUN: 24 mg/dL — ABNORMAL HIGH (ref 6–20)
CALCIUM: 9.1 mg/dL (ref 8.9–10.3)
CO2: 30 mmol/L (ref 22–32)
Chloride: 93 mmol/L — ABNORMAL LOW (ref 101–111)
Creatinine, Ser: 0.7 mg/dL (ref 0.61–1.24)
GFR calc Af Amer: >60 mL/min (ref >60)
GFR calc non Af Amer: >60 mL/min (ref >60)
Glucose, Bld: 99 mg/dL (ref 65–99)
Potassium: 3.9 mmol/L (ref 3.5–5.1)
Sodium: 134 mmol/L — ABNORMAL LOW (ref 135–145)
TOTAL PROTEIN: 8.1 g/dL (ref 6.5–8.1)
Total Bilirubin: 0.9 mg/dL (ref 0.3–1.2)

## 2018-03-26 LAB — URINALYSIS, COMPLETE (UACMP) WITH MICROSCOPIC
Bilirubin Urine: NEGATIVE
GLUCOSE, UA: NEGATIVE mg/dL
Ketones, ur: 5 mg/dL — AB
Leukocytes, UA: NEGATIVE
NITRITE: NEGATIVE
PH: 6 (ref 5.0–8.0)
PROTEIN: 100 mg/dL — AB
RBC / HPF: >50 RBC/hpf — ABNORMAL HIGH (ref 0–5)
Specific Gravity, Urine: 1.018 (ref 1.005–1.030)
Squamous Epithelial / LPF: NONE SEEN (ref 0–5)

## 2018-03-26 LAB — TSH

## 2018-03-26 LAB — LIPASE, BLOOD: Lipase: 44 U/L (ref 11–51)

## 2018-03-26 LAB — TROPONIN I: TROPONIN I: 0.03 ng/mL — AB (ref <0.03)

## 2018-03-26 LAB — T4, FREE: Free T4: >5.5 ng/dL — ABNORMAL HIGH (ref 0.82–1.77)

## 2018-03-26 LAB — LACTIC ACID, PLASMA: Lactic Acid, Venous: 1.2 mmol/L (ref 0.5–1.9)

## 2018-03-26 MED ORDER — MORPHINE SULFATE (PF) 2 MG/ML IV SOLN: 2.0000 mg | INTRAVENOUS | Status: DC | PRN

## 2018-03-26 MED ORDER — LEVOFLOXACIN IN D5W 500 MG/100ML IV SOLN
500.0000 mg | Freq: Once | INTRAVENOUS | Status: AC
  Administered 2018-03-26: 500 mg via INTRAVENOUS
  Filled 2018-03-26: qty 100

## 2018-03-26 MED ORDER — IPRATROPIUM-ALBUTEROL 0.5-2.5 (3) MG/3ML IN SOLN
3.0000 mL | Freq: Once | RESPIRATORY_TRACT | Status: AC
Start: 2018-03-26 — End: 2018-03-26
  Administered 2018-03-26: 3 mL via RESPIRATORY_TRACT
  Filled 2018-03-26: qty 3

## 2018-03-26 MED ORDER — GUAIFENESIN ER 600 MG PO TB12
600.0000 mg | ORAL_TABLET | Freq: Two times a day (BID) | ORAL | Status: DC
  Filled 2018-03-26: qty 1

## 2018-03-26 MED ORDER — METHYLPREDNISOLONE SODIUM SUCC 125 MG IJ SOLR
125.0000 mg | Freq: Once | INTRAMUSCULAR | Status: AC
  Administered 2018-03-26: 125 mg via INTRAVENOUS
  Filled 2018-03-26: qty 2

## 2018-03-26 MED ORDER — LEVOFLOXACIN IN D5W 500 MG/100ML IV SOLN
500.0000 mg | INTRAVENOUS | Status: DC
  Filled 2018-03-26: qty 100

## 2018-03-26 MED ORDER — DILTIAZEM HCL 60 MG PO TABS
60.0000 mg | ORAL_TABLET | Freq: Three times a day (TID) | ORAL | Status: DC
  Administered 2018-03-27 – 2018-03-30 (×12): 60 mg via ORAL
  Filled 2018-03-26 (×14): qty 1

## 2018-03-26 MED ORDER — IPRATROPIUM-ALBUTEROL 0.5-2.5 (3) MG/3ML IN SOLN
3.0000 mL | RESPIRATORY_TRACT | Status: DC
  Administered 2018-03-26 – 2018-03-27 (×6): 3 mL via RESPIRATORY_TRACT
  Filled 2018-03-26 (×6): qty 3

## 2018-03-26 MED ORDER — OXYCODONE HCL 5 MG PO TABS
5.0000 mg | ORAL_TABLET | ORAL | Status: DC | PRN
  Administered 2018-03-26 – 2018-03-30 (×18): 5 mg via ORAL
  Filled 2018-03-26 (×18): qty 1

## 2018-03-26 MED ORDER — SODIUM CHLORIDE 0.9 % IV SOLN
INTRAVENOUS | Status: DC
  Administered 2018-03-27 – 2018-03-30 (×2): via INTRAVENOUS

## 2018-03-26 MED ORDER — ACETAMINOPHEN 325 MG PO TABS: 650.0000 mg | ORAL_TABLET | Freq: Four times a day (QID) | ORAL | Status: DC | PRN

## 2018-03-26 MED ORDER — IPRATROPIUM-ALBUTEROL 0.5-2.5 (3) MG/3ML IN SOLN
3.0000 mL | Freq: Once | RESPIRATORY_TRACT | Status: AC
Start: 2018-03-26 — End: 2018-03-26
  Administered 2018-03-26: 3 mL via RESPIRATORY_TRACT
  Filled 2018-03-26: qty 6

## 2018-03-26 MED ORDER — SODIUM CHLORIDE 0.9 % IV BOLUS (SEPSIS)
1000.0000 mL | Freq: Once | INTRAVENOUS | Status: AC
  Administered 2018-03-26: 1000 mL via INTRAVENOUS

## 2018-03-26 MED ORDER — ALBUTEROL SULFATE (2.5 MG/3ML) 0.083% IN NEBU
2.5000 mg | INHALATION_SOLUTION | Freq: Once | RESPIRATORY_TRACT | Status: AC
  Administered 2018-03-26: 2.5 mg via RESPIRATORY_TRACT
  Filled 2018-03-26: qty 3

## 2018-03-26 MED ORDER — METHYLPREDNISOLONE SODIUM SUCC 125 MG IJ SOLR
60.0000 mg | Freq: Four times a day (QID) | INTRAMUSCULAR | Status: DC
  Administered 2018-03-27 (×2): 60 mg via INTRAVENOUS
  Filled 2018-03-26 (×2): qty 2

## 2018-03-26 MED ORDER — SODIUM CHLORIDE 0.9 % IV BOLUS (SEPSIS)
250.0000 mL | Freq: Once | INTRAVENOUS | Status: AC
  Administered 2018-03-26: 250 mL via INTRAVENOUS

## 2018-03-26 MED ORDER — ENOXAPARIN SODIUM 40 MG/0.4ML ~~LOC~~ SOLN
40.0000 mg | SUBCUTANEOUS | Status: DC
  Administered 2018-03-27: 40 mg via SUBCUTANEOUS
  Filled 2018-03-26: qty 0.4

## 2018-03-26 MED ORDER — ACETAMINOPHEN 650 MG RE SUPP: 650.0000 mg | Freq: Four times a day (QID) | RECTAL | Status: DC | PRN

## 2018-03-26 MED ORDER — BUDESONIDE 0.25 MG/2ML IN SUSP
0.2500 mg | Freq: Two times a day (BID) | RESPIRATORY_TRACT | Status: DC
  Administered 2018-03-27 – 2018-03-30 (×7): 0.25 mg via RESPIRATORY_TRACT
  Filled 2018-03-26 (×7): qty 2

## 2018-03-26 MED ORDER — GUAIFENESIN ER 600 MG PO TB12
600.0000 mg | ORAL_TABLET | Freq: Two times a day (BID) | ORAL | Status: DC
  Administered 2018-03-27 – 2018-03-30 (×8): 600 mg via ORAL
  Filled 2018-03-26 (×8): qty 1

## 2018-03-26 MED ORDER — ONDANSETRON HCL 4 MG/2ML IJ SOLN: 4.0000 mg | Freq: Four times a day (QID) | INTRAMUSCULAR | Status: DC | PRN

## 2018-03-26 MED ORDER — IOPAMIDOL (ISOVUE-370) INJECTION 76%
60.0000 mL | Freq: Once | INTRAVENOUS | Status: AC | PRN
  Administered 2018-03-26: 75 mL via INTRAVENOUS

## 2018-03-26 MED ORDER — ONDANSETRON HCL 4 MG PO TABS: 4.0000 mg | ORAL_TABLET | Freq: Four times a day (QID) | ORAL | Status: DC | PRN

## 2018-03-26 MED ORDER — NICOTINE 21 MG/24HR TD PT24
21.0000 mg | MEDICATED_PATCH | Freq: Every day | TRANSDERMAL | Status: DC
  Administered 2018-03-27 – 2018-03-30 (×5): 21 mg via TRANSDERMAL
  Filled 2018-03-26 (×5): qty 1

## 2018-03-26 NOTE — ED Notes (Signed)
Meal tray provided with po fluids.

## 2018-03-26 NOTE — Progress Notes (Addendum)
Erskin Burnet notified of troponin 0.03, TSH less than 0.01 and FT4 greater than 5.5, stated would put in orders.

## 2018-03-26 NOTE — Progress Notes (Signed)
Dr Caryn Bee notified pt admits to using cocaine, stated used  Yesterday, no new orders given. Would continue to monitor

## 2018-03-26 NOTE — ED Provider Notes (Signed)
[facility] Emergency Department Provider Note    First MD Initiated Contact with Patient 03/26/18 1719     (approximate)  I have reviewed the triage vital signs and the nursing notes.   HISTORY  Chief Complaint aching all over and Emesis    HPI Paul Day is a 62 y.o. male with history of COPD as well as MI, rheumatoid arthritis as well as history of mission to the hospital for substance abuse, emphysema, thyrotoxicosis presents to the ER with generalized abdominal discomfort as well as shortness of breath and fatigue.  Patient states he has been vomiting for 10 days.  States he is up really keeping food down.  States he has had multiple pound weight loss over the past week.  Denies any blood in his stools.  His pain is mild to moderate and has generalized aching.  Does admit to using cocaine yesterday.  Past Medical History:  Diagnosis Date  . Collagen vascular disease (HCC)   . Hyperlipidemia   . Myocardial infarct (HCC)   . Rheumatoid arthritis(714.0)    No family history on file. Past Surgical History:  Procedure Laterality Date  . LEG SURGERY Right    Patient Active Problem List   Diagnosis Date Noted  . Fever 03/26/2018  . Opiate withdrawal (HCC) 08/20/2016  . Opiate abuse, continuous (HCC) 08/20/2016  . Malignant essential hypertension 08/19/2016  . Sinus tachycardia 08/19/2016  . Tachycardia 02/13/2016  . Unstable angina (HCC) 02/11/2016  . CAP (community acquired pneumonia) 02/11/2016  . Severe sepsis (HCC) 02/11/2016  . Rheumatoid arthritis (HCC)       Prior to Admission medications   Not on File    Allergies Norco [hydrocodone-acetaminophen]    Social History Social History   Tobacco Use  . Smoking status: Current Every Day Smoker    Packs/day: 1.50    Types: Cigarettes  . Smokeless tobacco: Never Used  Substance Use Topics  . Alcohol use: No    Alcohol/week: 0.0 oz  . Drug use: Yes    Types: Cocaine,  Marijuana    Review of Systems Patient denies headaches, rhinorrhea, blurry vision, numbness, shortness of breath, chest pain, edema, cough, abdominal pain, nausea, vomiting, diarrhea, dysuria, fevers, rashes or hallucinations unless otherwise stated above in HPI. ____________________________________________   PHYSICAL EXAM:  VITAL SIGNS: Vitals:   03/26/18 2045 03/26/18 2100  BP:    Pulse: (!) 140 (!) 136  Resp: (!) 28 (!) 35  Temp:    SpO2: 97% 95%    Constitutional: Alert and oriented. Critically ill appearing, cachectic Eyes: Conjunctivae are normal.  Head: Atraumatic. Nose: No congestion/rhinnorhea. Mouth/Throat: Mucous membranes are moist.   Neck: No stridor. Painless ROM.  Cardiovascular: tachycardic rhythm. Grossly normal heart sounds.  Delayed cap refill Respiratory: tachypnea with diminished breathsounds throughout.  No retractions. Lungs CTAB. Gastrointestinal: Soft and with mild diffuse ttp. No distention. No abdominal bruits. No CVA tenderness. Genitourinary: deferred Musculoskeletal: No lower extremity tenderness nor edema.  No joint effusions. Neurologic:  Normal speech and language. No gross focal neurologic deficits are appreciated. No facial droop Skin:  Skin is warm, dry and intact. No rash noted. Psychiatric: Mood and affect are normal. Speech and behavior are normal.  ____________________________________________   LABS (all labs ordered are listed, but only abnormal results are displayed)  Results for orders placed or performed during the hospital encounter of 03/26/18 (from the past 24 hour(s))  Lipase, blood     Status: None   Collection Time:  03/26/18  5:01 PM  Result Value Ref Range   Lipase 44 11 - 51 U/L  Comprehensive metabolic panel     Status: Abnormal   Collection Time: 03/26/18  5:01 PM  Result Value Ref Range   Sodium 134 (L) 135 - 145 mmol/L   Potassium 3.9 3.5 - 5.1 mmol/L   Chloride 93 (L) 101 - 111 mmol/L   CO2 30 22 - 32 mmol/L     Glucose, Bld 99 65 - 99 mg/dL   BUN 24 (H) 6 - 20 mg/dL   Creatinine, Ser 8.92 0.61 - 1.24 mg/dL   Calcium 9.1 8.9 - 11.9 mg/dL   Total Protein 8.1 6.5 - 8.1 g/dL   Albumin 3.1 (L) 3.5 - 5.0 g/dL   AST 41 15 - 41 U/L   ALT 31 17 - 63 U/L   Alkaline Phosphatase 165 (H) 38 - 126 U/L   Total Bilirubin 0.9 0.3 - 1.2 mg/dL   GFR calc non Af Amer >60 >60 mL/min   GFR calc Af Amer >60 >60 mL/min   Anion gap 11 5 - 15  CBC     Status: Abnormal   Collection Time: 03/26/18  5:01 PM  Result Value Ref Range   WBC 8.3 3.8 - 10.6 K/uL   RBC 5.32 4.40 - 5.90 MIL/uL   Hemoglobin 13.7 13.0 - 18.0 g/dL   HCT 41.7 40.8 - 14.4 %   MCV 77.0 (L) 80.0 - 100.0 fL   MCH 25.8 (L) 26.0 - 34.0 pg   MCHC 33.4 32.0 - 36.0 g/dL   RDW 81.8 56.3 - 14.9 %   Platelets 176 150 - 440 K/uL  Urinalysis, Complete w Microscopic     Status: Abnormal   Collection Time: 03/26/18  5:23 PM  Result Value Ref Range   Color, Urine YELLOW (A) YELLOW   APPearance CLEAR (A) CLEAR   Specific Gravity, Urine 1.018 1.005 - 1.030   pH 6.0 5.0 - 8.0   Glucose, UA NEGATIVE NEGATIVE mg/dL   Hgb urine dipstick MODERATE (A) NEGATIVE   Bilirubin Urine NEGATIVE NEGATIVE   Ketones, ur 5 (A) NEGATIVE mg/dL   Protein, ur 702 (A) NEGATIVE mg/dL   Nitrite NEGATIVE NEGATIVE   Leukocytes, UA NEGATIVE NEGATIVE   RBC / HPF >50 (H) 0 - 5 RBC/hpf   WBC, UA 0-5 0 - 5 WBC/hpf   Bacteria, UA RARE (A) NONE SEEN   Squamous Epithelial / LPF NONE SEEN 0 - 5   Mucus PRESENT   Lactic acid, plasma     Status: None   Collection Time: 03/26/18  5:24 PM  Result Value Ref Range   Lactic Acid, Venous 1.2 0.5 - 1.9 mmol/L   ____________________________________________  EKG My review and personal interpretation at Time: 17:13   Indication: tachycardia  Rate: 125  Rhythm: sinus tach Axis: normal Other: normal intervals, no stemi, nonspecific st changes  ____________________________________________  RADIOLOGY  I personally reviewed all  radiographic images ordered to evaluate for the above acute complaints and reviewed radiology reports and findings.  These findings were personally discussed with the patient.  Please see medical record for radiology report.  ____________________________________________   PROCEDURES  Procedure(s) performed:  .Critical Care Performed by: Willy Eddy, MD Authorized by: Willy Eddy, MD   Critical care provider statement:    Critical care time was exclusive of:  Separately billable procedures and treating other patients   Critical care was necessary to treat or prevent imminent or life-threatening deterioration of  the following conditions:  Respiratory failure   Critical care was time spent personally by me on the following activities:  Development of treatment plan with patient or surrogate, discussions with consultants, evaluation of patient's response to treatment, examination of patient, obtaining history from patient or surrogate, ordering and performing treatments and interventions, ordering and review of laboratory studies, ordering and review of radiographic studies, pulse oximetry, re-evaluation of patient's condition and review of old charts      Critical Care performed: yes ____________________________________________   INITIAL IMPRESSION / ASSESSMENT AND PLAN / ED COURSE  Pertinent labs & imaging results that were available during my care of the patient were reviewed by me and considered in my medical decision making (see chart for details).  DDX: pna, malignancy, pe, chf, aki, rhabdo, sbo, withdrawal  GRAVIEL LIVINGSTON is a 62 y.o. who presents to the ED with symptoms as described above.  Patient very ill-appearing and cachectic.  IV fluids initiated due to concern for sepsis given his tachycardia and respiratory drive.  Most clinically consistent with underlying COPD and emphysema given his heavy smoking history but CT imaging will be ordered to evaluate for head  injury given his report of fall.  The patient will be placed on continuous pulse oximetry and telemetry for monitoring.  Laboratory evaluation will be sent to evaluate for the above complaints.     Clinical Course as of Mar 27 2107  Fri Mar 26, 2018  1822 Bacteria, UA(!): RARE [PR]  1836 CT head fortunately shows no evidence of trauma however the patient does have rare bacteria with blood in his urine.  Will order CT imaging to evaluate for stone pyelonephritis.  Patient also with persistent tachypnea and tachycardia with mild hypoxia given his frailty and critically ill appearance will order CT angiogram to evaluate for pulmonary embolism.   [PR]  2005 CT angiogram shows no evidence of PE.  Scattered emphysematous changes possible early pneumonia as well as evidence of probable enteritis therefore will give IV antibiotics.  Patient persistent tachycardia.  Still awaiting thyroid studies but is clinically otherwise improving with fluid resuscitation.  Feels his breathing is much improved after nebulizer treatments as well as steroids.  Patient will require hospitalization.  Have discussed with the patient and available family all diagnostics and treatments performed thus far and all questions were answered to the best of my ability. The patient demonstrates understanding and agreement with plan.    [PR]    Clinical Course User Index [PR] Willy Eddy, MD     As part of my medical decision making, I reviewed the following data within the electronic MEDICAL RECORD NUMBER Nursing notes reviewed and incorporated, Labs reviewed, notes from prior ED visits and [place] Controlled Substance Database   ____________________________________________   FINAL CLINICAL IMPRESSION(S) / ED DIAGNOSES  Final diagnoses:  Tachycardia  Dehydration  Enteritis      NEW MEDICATIONS STARTED DURING THIS VISIT:  New Prescriptions   No medications on file     Note:  This document was prepared using Dragon  voice recognition software and may include unintentional dictation errors.    Willy Eddy, MD 03/26/18 2108

## 2018-03-26 NOTE — ED Triage Notes (Signed)
ARrives with c/o feeling bad, vomiting x 10 days.  Patient is very thin and malnourished looking.

## 2018-03-26 NOTE — Progress Notes (Signed)
CODE SEPSIS - PHARMACY COMMUNICATION  **Broad Spectrum Antibiotics should be administered within 1 hour of Sepsis diagnosis**  Code sepsis order entered at 1732  Antibiotics Ordered: none  Time of 1st antibiotic administration:   Additional action taken by pharmacy:   If necessary, Name of Provider/Nurse Contacted:   Patient admitted after "feeling bad, vomiting x 10 days" per RN note. LA and WBC normal and afebrile. Tachycardia and tachypnea not specific to sepsis.   Paul Day ,PharmD Clinical Pharmacist  03/26/2018  6:35 PM

## 2018-03-26 NOTE — H&P (Signed)
[employer] - [place] at [facility]   PATIENT NAME: Paul Day    MR#:  [id number]  DATE OF BIRTH:  04-19-1956  DATE OF ADMISSION:  03/26/2018  PRIMARY CARE PHYSICIAN: Patient, No Pcp Per   REQUESTING/REFERRING PHYSICIAN: Willy Eddy, MD  CHIEF COMPLAINT:   Chief Complaint  Patient presents with  . aching all over  . Emesis    HISTORY OF PRESENT ILLNESS: Paul Day  is a 62 y.o. male with a known history of rheumatoid arthritis, hyperlipidemia, history of coronary artery disease presenting with generalized aching and fever at home.  Patient states that he has been feeling like this for the past few days.  He also has had shortness of breath cough and wheezing.  Patient reports that his temperature was 103 at home.  Patient also was complaining of diarrhea and nausea.  Had a CT scan of the chest and abdomen which showed bronchitic type with changes.  And enteritis on the abdomen.  He denies any urinary symptoms. States that his rheumatoid arthritis is not treated because he is unable to afford the medications.  PAST MEDICAL HISTORY:   Past Medical History:  Diagnosis Date  . Collagen vascular disease (HCC)   . Hyperlipidemia   . Myocardial infarct (HCC)   . Rheumatoid arthritis(714.0)     PAST SURGICAL HISTORY:  Past Surgical History:  Procedure Laterality Date  . LEG SURGERY Right     SOCIAL HISTORY:  Social History   Tobacco Use  . Smoking status: Current Every Day Smoker    Packs/day: 1.50    Types: Cigarettes  . Smokeless tobacco: Never Used  Substance Use Topics  . Alcohol use: No    Alcohol/week: 0.0 oz    FAMILY HISTORY: No family history on file.  DRUG ALLERGIES:  Allergies  Allergen Reactions  . Norco [Hydrocodone-Acetaminophen] Itching and Rash    REVIEW OF SYSTEMS:   CONSTITUTIONAL: positive  fever, positive fatigue or weakness.  EYES: No blurred or double vision.  EARS, NOSE, AND THROAT: No tinnitus or ear pain.   RESPIRATORY: Positive cough, positive shortness of breath, positive wheezing or hemoptysis.  CARDIOVASCULAR: No chest pain, orthopnea, edema.  GASTROINTESTINAL: No nausea, vomiting, positive diarrhea or abdominal pain.  GENITOURINARY: No dysuria, hematuria.  ENDOCRINE: No polyuria, nocturia,  HEMATOLOGY: No anemia, easy bruising or bleeding SKIN: No rash or lesion. MUSCULOSKELETAL: No joint pain or arthritis.   NEUROLOGIC: No tingling, numbness, weakness.  PSYCHIATRY: No anxiety or depression.   MEDICATIONS AT HOME:  Prior to Admission medications   Not on File      PHYSICAL EXAMINATION:   VITAL SIGNS: Blood pressure (!) 165/88, pulse (!) 137, temperature 99 F (37.2 C), temperature source Oral, resp. rate (!) 31, height 5\' 6"  (1.676 m), weight 38.4 kg (84 lb 11.2 oz), SpO2 93 %.  GENERAL:  62 y.o.-year-old patient lying in the bed cachectic looking  eYES: Pupils equal, round, reactive to light and accommodation. No scleral icterus. Extraocular muscles intact.  HEENT: Head atraumatic, normocephalic. Oropharynx and nasopharynx clear.  NECK:  Supple, no jugular venous distention. No thyroid enlargement, no tenderness.  LUNGS: Decreased breath sounds bilaterally with some accessory muscle usage CARDIOVASCULAR: S1, S2 normal. No murmurs, rubs, or gallops.  ABDOMEN: Soft, nontender, nondistended. Bowel sounds present. No organomegaly or mass.  EXTREMITIES: No pedal edema, cyanosis, or clubbing.  NEUROLOGIC: Cranial nerves II through XII are intact. Muscle strength 5/5 in all extremities. Sensation intact. Gait not checked.  PSYCHIATRIC: The patient  is alert and oriented x 3.  SKIN: No obvious rash, lesion, or ulcer.   LABORATORY PANEL:   CBC Recent Labs  Lab 03/26/18 1701  WBC 8.3  HGB 13.7  HCT 41.0  PLT 176  MCV 77.0*  MCH 25.8*  MCHC 33.4  RDW 14.2    ------------------------------------------------------------------------------------------------------------------  Chemistries  Recent Labs  Lab 03/26/18 1701  NA 134*  K 3.9  CL 93*  CO2 30  GLUCOSE 99  BUN 24*  CREATININE 0.70  CALCIUM 9.1  AST 41  ALT 31  ALKPHOS 165*  BILITOT 0.9   ------------------------------------------------------------------------------------------------------------------ estimated creatinine clearance is 51.3 mL/min (by C-G formula based on SCr of 0.7 mg/dL). ------------------------------------------------------------------------------------------------------------------ No results for input(s): TSH, T4TOTAL, T3FREE, THYROIDAB in the last 72 hours.  Invalid input(s): FREET3   Coagulation profile No results for input(s): INR, PROTIME in the last 168 hours. ------------------------------------------------------------------------------------------------------------------- No results for input(s): DDIMER in the last 72 hours. -------------------------------------------------------------------------------------------------------------------  Cardiac Enzymes No results for input(s): CKMB, TROPONINI, MYOGLOBIN in the last 168 hours.  Invalid input(s): CK ------------------------------------------------------------------------------------------------------------------ Invalid input(s): POCBNP  ---------------------------------------------------------------------------------------------------------------  Urinalysis    Component Value Date/Time   COLORURINE YELLOW (A) 03/26/2018 1723   APPEARANCEUR CLEAR (A) 03/26/2018 1723   APPEARANCEUR Hazy 08/15/2013 2053   LABSPEC 1.018 03/26/2018 1723   LABSPEC 1.025 08/15/2013 2053   PHURINE 6.0 03/26/2018 1723   GLUCOSEU NEGATIVE 03/26/2018 1723   GLUCOSEU Negative 08/15/2013 2053   HGBUR MODERATE (A) 03/26/2018 1723   BILIRUBINUR NEGATIVE 03/26/2018 1723   BILIRUBINUR Negative 08/15/2013 2053    KETONESUR 5 (A) 03/26/2018 1723   PROTEINUR 100 (A) 03/26/2018 1723   UROBILINOGEN 0.2 11/28/2013 0957   NITRITE NEGATIVE 03/26/2018 1723   LEUKOCYTESUR NEGATIVE 03/26/2018 1723   LEUKOCYTESUR 1+ 08/15/2013 2053     RADIOLOGY: Ct Head Wo Contrast  Result Date: 03/26/2018 CLINICAL DATA:  Weak for several days with history of fall EXAM: CT HEAD WITHOUT CONTRAST TECHNIQUE: Contiguous axial images were obtained from the base of the skull through the vertex without intravenous contrast. COMPARISON:  08/19/2016 head CT FINDINGS: Brain: No evidence of acute infarction, hemorrhage, hydrocephalus, extra-axial collection or mass lesion/mass effect. Vascular: No hyperdense vessel or unexpected calcification. Skull: Normal. Negative for fracture or focal lesion. Sinuses/Orbits: Mucosal thickening and fluid within the sphenoid ethmoid maxillary and frontal sinuses. Other: None IMPRESSION: 1. Negative non contrasted CT appearance of the brain 2. Pansinusitis Electronically Signed   By: Jasmine Pang M.D.   On: 03/26/2018 17:57   Ct Angio Chest Pe W And/or Wo Contrast  Result Date: 03/26/2018 CLINICAL DATA:  Vomiting times 10 days with malaise.  Cachectic. EXAM: CT ANGIOGRAPHY CHEST CT ABDOMEN AND PELVIS WITH CONTRAST TECHNIQUE: Multidetector CT imaging of the chest was performed using the standard protocol during bolus administration of intravenous contrast. Multiplanar CT image reconstructions and MIPs were obtained to evaluate the vascular anatomy. Multidetector CT imaging of the abdomen and pelvis was performed using the standard protocol during bolus administration of intravenous contrast. CONTRAST:  14mL ISOVUE-370 IOPAMIDOL (ISOVUE-370) INJECTION 76% COMPARISON:  12/26/2012 CT abdomen and pelvis, chest CT 02/05/2016 FINDINGS: CTA CHEST FINDINGS Cardiovascular: Heart size is normal without pericardial effusion. Mild aortic and coronary arteriosclerosis. No aneurysm. No acute pulmonary embolus.  Mediastinum/Nodes: Redemonstration of a 15 x 13 mm solid appearing posterior left thyroid nodule, unchanged in size since 02/05/2016 by can be further correlated with ultrasound on a nonemergent basis. No thyromegaly. Patent midline trachea and mainstem bronchi. Esophagus is unremarkable. Mediastinal and hilar  lymphadenopathy, slightly increased size since prior especially within the hila. The largest right hilar lymph node measures approximately 10 mm short axis, the largest left hilar lymph node measures approximately 11 mm short axis, subcarinal lymph node measures 16 mm short axis (versus 11 mm), right lower paratracheal 12 mm (versus 11 mm) and left paratracheal 16 mm (previously not apparent). Lungs/Pleura: Diffuse centrilobular emphysema is redemonstrated, upper lobe predominant. The following nodular opacities are noted. 1. Stable 6.1 mm (previously 6.2 mm) in average left upper lobe pulmonary nodule unchanged from prior, series 6/18. 2. Stable 3.4 mm left upper lobe nodule versus 3.8 mm previously, series 6/18. 3. Stable spiculated density in the right upper lobe without significant change likely represent scarring, series 6/14. 4. Stable 3.8 mm nodular opacity along the minor fissure possibly a fissural lymph node, series 6/68. 5. New left lower lobe posterior basal segment cluster of tiny nodules, the largest approximately 6 mm. Findings may be postinfectious or inflammatory in etiology given faintly discernible pulmonary nodules of similar size and morphology in the left lower lobe and lingula. Series 6/50 and 57. 6. Subpleural opacity in the left upper lobe anteriorly, series 6/49 may represent an area of postinfectious change atelectasis. 7. Lingular 3.2 mm nodular opacity is stable. Additional subpleural nodule on series 6/66 appears to be calcifying and may reflect a small granuloma. This measures approximately 4.5 mm, series 6/66. 8. Subpleural left lower lobe medial basal segment 15 mm opacity is  new and could represent a small focus of pneumonia. Neoplasm is not excluded. Short-term interval follow-up is therefore recommended after trial of antibiotics. Similar more confluent opacity in the right lower lobe posteriorly. Mild peribronchial and peribronchiolar thickening is identified within the right middle and lower lobes with subpleural areas of pulmonary opacity are noted likely postinfectious or postinflammatory in etiology. Musculoskeletal: No chest wall abnormality. No acute or significant osseous findings. Review of the MIP images confirms the above findings. CT ABDOMEN and PELVIS FINDINGS Hepatobiliary: The gallbladder is nondistended and free of stones. No space-occupying mass of the liver. No choledocholithiasis. Slight nonspecific intra hepatic ductal dilatation. Pancreas: Atrophic without mass or inflammation. Spleen: No splenomegaly or mass. Adrenals/Urinary Tract: 2 cm left upper pole renal cyst unchanged with smaller bilateral interpolar renal cysts noted, the next largest in the left kidney measuring up to 1.2 cm. Nonobstructing right-sided renal calculi, the largest measuring 4 mm. No hydroureteronephrosis. Unremarkable urinary bladder for degree of distention. Stomach/Bowel: Stomach is physiologically distended. There is normal bowel rotation. Mild gas and fluid-filled distention of small bowel loops with transmural thickening and fold thickening is identified. No mechanical source of bowel obstruction. Findings would be consistent with small bowel enteritis. The colon is unremarkable without significant mural thickening or obstruction. Normal-appearing appendix. Vascular/Lymphatic: Moderate aortoiliac atherosclerosis without aneurysm. Reproductive: Enlarged prostate with peripheral zone calcifications and heterogeneous appearance along the peripheral zone on the left. Discrete mass is not identified however. Other: No free air nor free fluid. Musculoskeletal: No aggressive osseous lesions.  Review of the MIP images confirms the above findings. IMPRESSION: Chest CT: 1. No acute pulmonary embolus. 2. Upper lobe predominant centrilobular emphysema with stable left upper lobe, right upper lobe and lingular nodules. New confluent opacities in both lower lobes right greater than left are identified which may be postinfectious or inflammatory in etiology given peribronchial thickening and tiny tree-in-bud opacities. Subpleural areas of postinfectious or inflammatory change also noted in the right middle lobe and anterior right upper lobe. 3. Irregular pulmonary opacity in  the medial left lower lobe is is new in appearance in though findings could be related to a postinfectious or inflammatory etiology, short-term interval follow-up to assure resolution is recommended and to exclude neoplasm in 4-6 weeks after treating for pneumonia. This measures approximately 15 mm in diameter. CT AP: 1. Bilateral renal cysts. Nonobstructing right interpolar renal calculus. 2. Findings compatible with diffuse small bowel enteritis without mechanical bowel obstruction. Normal appendix. 3. Mild enlargement of the prostate with peripheral zone calcifications. Electronically Signed   By: Tollie Eth M.D.   On: 03/26/2018 19:26   Ct Abdomen Pelvis W Contrast  Result Date: 03/26/2018 CLINICAL DATA:  Vomiting times 10 days with malaise.  Cachectic. EXAM: CT ANGIOGRAPHY CHEST CT ABDOMEN AND PELVIS WITH CONTRAST TECHNIQUE: Multidetector CT imaging of the chest was performed using the standard protocol during bolus administration of intravenous contrast. Multiplanar CT image reconstructions and MIPs were obtained to evaluate the vascular anatomy. Multidetector CT imaging of the abdomen and pelvis was performed using the standard protocol during bolus administration of intravenous contrast. CONTRAST:  103mL ISOVUE-370 IOPAMIDOL (ISOVUE-370) INJECTION 76% COMPARISON:  12/26/2012 CT abdomen and pelvis, chest CT 02/05/2016 FINDINGS:  CTA CHEST FINDINGS Cardiovascular: Heart size is normal without pericardial effusion. Mild aortic and coronary arteriosclerosis. No aneurysm. No acute pulmonary embolus. Mediastinum/Nodes: Redemonstration of a 15 x 13 mm solid appearing posterior left thyroid nodule, unchanged in size since 02/05/2016 by can be further correlated with ultrasound on a nonemergent basis. No thyromegaly. Patent midline trachea and mainstem bronchi. Esophagus is unremarkable. Mediastinal and hilar lymphadenopathy, slightly increased size since prior especially within the hila. The largest right hilar lymph node measures approximately 10 mm short axis, the largest left hilar lymph node measures approximately 11 mm short axis, subcarinal lymph node measures 16 mm short axis (versus 11 mm), right lower paratracheal 12 mm (versus 11 mm) and left paratracheal 16 mm (previously not apparent). Lungs/Pleura: Diffuse centrilobular emphysema is redemonstrated, upper lobe predominant. The following nodular opacities are noted. 1. Stable 6.1 mm (previously 6.2 mm) in average left upper lobe pulmonary nodule unchanged from prior, series 6/18. 2. Stable 3.4 mm left upper lobe nodule versus 3.8 mm previously, series 6/18. 3. Stable spiculated density in the right upper lobe without significant change likely represent scarring, series 6/14. 4. Stable 3.8 mm nodular opacity along the minor fissure possibly a fissural lymph node, series 6/68. 5. New left lower lobe posterior basal segment cluster of tiny nodules, the largest approximately 6 mm. Findings may be postinfectious or inflammatory in etiology given faintly discernible pulmonary nodules of similar size and morphology in the left lower lobe and lingula. Series 6/50 and 57. 6. Subpleural opacity in the left upper lobe anteriorly, series 6/49 may represent an area of postinfectious change atelectasis. 7. Lingular 3.2 mm nodular opacity is stable. Additional subpleural nodule on series 6/66 appears  to be calcifying and may reflect a small granuloma. This measures approximately 4.5 mm, series 6/66. 8. Subpleural left lower lobe medial basal segment 15 mm opacity is new and could represent a small focus of pneumonia. Neoplasm is not excluded. Short-term interval follow-up is therefore recommended after trial of antibiotics. Similar more confluent opacity in the right lower lobe posteriorly. Mild peribronchial and peribronchiolar thickening is identified within the right middle and lower lobes with subpleural areas of pulmonary opacity are noted likely postinfectious or postinflammatory in etiology. Musculoskeletal: No chest wall abnormality. No acute or significant osseous findings. Review of the MIP images confirms the above findings.  CT ABDOMEN and PELVIS FINDINGS Hepatobiliary: The gallbladder is nondistended and free of stones. No space-occupying mass of the liver. No choledocholithiasis. Slight nonspecific intra hepatic ductal dilatation. Pancreas: Atrophic without mass or inflammation. Spleen: No splenomegaly or mass. Adrenals/Urinary Tract: 2 cm left upper pole renal cyst unchanged with smaller bilateral interpolar renal cysts noted, the next largest in the left kidney measuring up to 1.2 cm. Nonobstructing right-sided renal calculi, the largest measuring 4 mm. No hydroureteronephrosis. Unremarkable urinary bladder for degree of distention. Stomach/Bowel: Stomach is physiologically distended. There is normal bowel rotation. Mild gas and fluid-filled distention of small bowel loops with transmural thickening and fold thickening is identified. No mechanical source of bowel obstruction. Findings would be consistent with small bowel enteritis. The colon is unremarkable without significant mural thickening or obstruction. Normal-appearing appendix. Vascular/Lymphatic: Moderate aortoiliac atherosclerosis without aneurysm. Reproductive: Enlarged prostate with peripheral zone calcifications and heterogeneous  appearance along the peripheral zone on the left. Discrete mass is not identified however. Other: No free air nor free fluid. Musculoskeletal: No aggressive osseous lesions. Review of the MIP images confirms the above findings. IMPRESSION: Chest CT: 1. No acute pulmonary embolus. 2. Upper lobe predominant centrilobular emphysema with stable left upper lobe, right upper lobe and lingular nodules. New confluent opacities in both lower lobes right greater than left are identified which may be postinfectious or inflammatory in etiology given peribronchial thickening and tiny tree-in-bud opacities. Subpleural areas of postinfectious or inflammatory change also noted in the right middle lobe and anterior right upper lobe. 3. Irregular pulmonary opacity in the medial left lower lobe is is new in appearance in though findings could be related to a postinfectious or inflammatory etiology, short-term interval follow-up to assure resolution is recommended and to exclude neoplasm in 4-6 weeks after treating for pneumonia. This measures approximately 15 mm in diameter. CT AP: 1. Bilateral renal cysts. Nonobstructing right interpolar renal calculus. 2. Findings compatible with diffuse small bowel enteritis without mechanical bowel obstruction. Normal appendix. 3. Mild enlargement of the prostate with peripheral zone calcifications. Electronically Signed   By: Tollie Eth M.D.   On: 03/26/2018 19:26   Dg Chest Port 1 View  Result Date: 03/26/2018 CLINICAL DATA:  Generalized weakness with vomiting EXAM: PORTABLE CHEST 1 VIEW COMPARISON:  08/20/2016 FINDINGS: Hyperinflation. No focal opacity or pleural effusion. Normal heart size. No pneumothorax. IMPRESSION: No active disease.  Hyperinflation. Electronically Signed   By: Jasmine Pang M.D.   On: 03/26/2018 18:05    EKG: Orders placed or performed during the hospital encounter of 09/09/16  . ED EKG  . ED EKG  . EKG 12-Lead  . EKG 12-Lead  . EKG    IMPRESSION AND  PLAN: Patient is a 62 year old with history of rheumatoid arthritis nicotine abuse presenting with multiple complaints  1.  Fever likely due to bronchitis I will treat with IV Levaquin Await blood cultures Check procalcitonin levels Due to cocaine use I will check HIV  2.  Diarrhea likely due to gastroenteritis however will rule out C. Difficile Check stool comprehensive panel  3.  Acute on chronic COPD exacerbation I will treat with nebulizer steroids and antibiotics  4.  Rheumatoid arthritis likely under poor control check a sed rate and rheumatoid factor  5.  Nicotine abuse smoking cessation provided 4 minutes spent strongly recommended patient stop smoking I will start patient on nicotine patch  6.  CODE STATUS discussed with the patient wants to be a full code    All the records  are reviewed and case discussed with ED provider. Management plans discussed with the patient, family and they are in agreement.  CODE STATUS: Code Status History    Date Active Date Inactive Code Status Order ID Comments User Context   08/19/2016 1524 08/22/2016 1517 Full Code [id number]  Katharina Caper, MD Inpatient   02/11/2016 2237 02/13/2016 1813 Full Code [id number]  Oralia Manis, MD Inpatient       TOTAL TIME TAKING CARE OF THIS PATIENT: 55 minutes.    Auburn Bilberry M.D on 03/26/2018 at 8:54 PM  Between 7am to 6pm - Pager - [phone]  After 6pm go to [email or web address] - password [email or web address] [facility]  [employer] [facility]  [phone]  CC: Primary care physician; Patient, No Pcp Per

## 2018-03-26 NOTE — Progress Notes (Signed)
Advanced care plan.  Purpose of the Encounter: CODE STATUS  Parties in Attendance: Patient himself  Patient's Decision Capacity: Intact  Subjective/Patient's story: Patient is a 62 year old white male with history of COPD rheumatoid arthritis drug use presenting with fever shortness of breath nausea vomiting diarrhea   Objective/Medical story I discussed with the patient regarding his chronic medical condition and asked him for his wishes for intubation CPR.  Patient states that he would like to be intubated and resuscitated initially but would not want to be kept on the machine prolonged period of time   Goals of care determination: Full code    CODE STATUS: Full code   Time spent discussing advanced care planning: 16 minutes

## 2018-03-27 LAB — C DIFFICILE QUICK SCREEN W PCR REFLEX
C DIFFICILE (CDIFF) TOXIN: NEGATIVE
C DIFFICLE (CDIFF) ANTIGEN: POSITIVE — AB

## 2018-03-27 LAB — CLOSTRIDIUM DIFFICILE BY PCR, REFLEXED: CDIFFPCR: POSITIVE — AB

## 2018-03-27 LAB — PREALBUMIN

## 2018-03-27 LAB — SEDIMENTATION RATE: Sed Rate: 78 mm/hr — ABNORMAL HIGH (ref 0–20)

## 2018-03-27 MED ORDER — VANCOMYCIN 50 MG/ML ORAL SOLUTION
125.0000 mg | Freq: Four times a day (QID) | ORAL | Status: DC
  Administered 2018-03-27 – 2018-03-30 (×15): 125 mg via ORAL
  Filled 2018-03-27 (×19): qty 2.5

## 2018-03-27 MED ORDER — FLORANEX PO PACK
1.0000 g | PACK | Freq: Three times a day (TID) | ORAL | Status: DC
  Administered 2018-03-27: 1 g via ORAL
  Filled 2018-03-27 (×2): qty 1

## 2018-03-27 MED ORDER — GABAPENTIN 100 MG PO CAPS
200.0000 mg | ORAL_CAPSULE | Freq: Three times a day (TID) | ORAL | Status: DC
  Administered 2018-03-27 – 2018-03-30 (×9): 200 mg via ORAL
  Filled 2018-03-27 (×9): qty 2

## 2018-03-27 MED ORDER — BACID PO TABS
1.0000 | ORAL_TABLET | Freq: Three times a day (TID) | ORAL | Status: DC
  Filled 2018-03-27: qty 1

## 2018-03-27 MED ORDER — METHIMAZOLE 10 MG PO TABS
10.0000 mg | ORAL_TABLET | Freq: Every day | ORAL | Status: DC
  Administered 2018-03-27 – 2018-03-28 (×2): 10 mg via ORAL
  Filled 2018-03-27 (×3): qty 1

## 2018-03-27 MED ORDER — LEVALBUTEROL HCL 0.63 MG/3ML IN NEBU
0.6300 mg | INHALATION_SOLUTION | Freq: Four times a day (QID) | RESPIRATORY_TRACT | Status: DC
  Administered 2018-03-28 (×2): 0.63 mg via RESPIRATORY_TRACT
  Filled 2018-03-27 (×3): qty 3

## 2018-03-27 MED ORDER — AMITRIPTYLINE HCL 50 MG PO TABS
50.0000 mg | ORAL_TABLET | Freq: Every day | ORAL | Status: DC
  Administered 2018-03-27 – 2018-03-29 (×3): 50 mg via ORAL
  Filled 2018-03-27 (×4): qty 1

## 2018-03-27 MED ORDER — ENOXAPARIN SODIUM 30 MG/0.3ML ~~LOC~~ SOLN
30.0000 mg | SUBCUTANEOUS | Status: DC
  Administered 2018-03-28: 30 mg via SUBCUTANEOUS
  Filled 2018-03-27: qty 0.3

## 2018-03-27 MED ORDER — RISAQUAD PO CAPS
1.0000 | ORAL_CAPSULE | Freq: Three times a day (TID) | ORAL | Status: DC
  Administered 2018-03-27 – 2018-03-30 (×9): 1 via ORAL
  Filled 2018-03-27 (×8): qty 1

## 2018-03-27 MED ORDER — CYCLOBENZAPRINE HCL 10 MG PO TABS
5.0000 mg | ORAL_TABLET | Freq: Three times a day (TID) | ORAL | Status: DC | PRN
  Administered 2018-03-27 – 2018-03-29 (×3): 5 mg via ORAL
  Administered 2018-03-30: 06:00:00 via ORAL
  Filled 2018-03-27 (×5): qty 1

## 2018-03-27 MED ORDER — PREDNISONE 50 MG PO TABS
50.0000 mg | ORAL_TABLET | Freq: Every day | ORAL | Status: DC
  Administered 2018-03-28 – 2018-03-30 (×3): 50 mg via ORAL
  Filled 2018-03-27 (×3): qty 1

## 2018-03-27 NOTE — Progress Notes (Signed)
Patient on lovenox 40 mg subq daily for VTE prophylaxis. TBW 38.4 kg CrCl 51.3 ml/min  Will reduce dose to lovenox 30 mg subq daily.  Thomasene Ripple, PharmD, BCPS Clinical Pharmacist 03/27/2018

## 2018-03-27 NOTE — Progress Notes (Signed)
Prime doc notified pt positive for c-diff. Orders given.

## 2018-03-27 NOTE — Progress Notes (Signed)
[employer] [facility] [place] at [facility]   PATIENT NAME: Paul Day    MR#:  [id number]  DATE OF BIRTH:  04/29/1956  SUBJECTIVE:  CHIEF COMPLAINT:   Chief Complaint  Patient presents with  . aching all over  . Emesis  Patient complains of acute on chronic back pain as well as diarrhea for 7 to 10 days, son at the bedside  REVIEW OF SYSTEMS:  CONSTITUTIONAL: No fever, fatigue or weakness.  EYES: No blurred or double vision.  EARS, NOSE, AND THROAT: No tinnitus or ear pain.  RESPIRATORY: No cough, shortness of breath, wheezing or hemoptysis.  CARDIOVASCULAR: No chest pain, orthopnea, edema.  GASTROINTESTINAL: No nausea, vomiting, diarrhea or abdominal pain.  GENITOURINARY: No dysuria, hematuria.  ENDOCRINE: No polyuria, nocturia,  HEMATOLOGY: No anemia, easy bruising or bleeding SKIN: No rash or lesion. MUSCULOSKELETAL: No joint pain or arthritis.   NEUROLOGIC: No tingling, numbness, weakness.  PSYCHIATRY: No anxiety or depression.   ROS  DRUG ALLERGIES:   Allergies  Allergen Reactions  . Norco [Hydrocodone-Acetaminophen] Itching and Rash    VITALS:  Blood pressure (!) 151/63, pulse 90, temperature 97.9 F (36.6 C), temperature source Oral, resp. rate 18, height 5\' 6"  (1.676 m), weight 38.4 kg (84 lb 11.2 oz), SpO2 100 %.  PHYSICAL EXAMINATION:  GENERAL:  62 y.o.-year-old patient lying in the bed with no acute distress.  EYES: Pupils equal, round, reactive to light and accommodation. No scleral icterus. Extraocular muscles intact.  HEENT: Head atraumatic, normocephalic. Oropharynx and nasopharynx clear.  NECK:  Supple, no jugular venous distention. No thyroid enlargement, no tenderness.  LUNGS: Normal breath sounds bilaterally, no wheezing, rales,rhonchi or crepitation. No use of accessory muscles of respiration.  CARDIOVASCULAR: S1, S2 normal. No murmurs, rubs, or gallops.  ABDOMEN: Soft, nontender, nondistended. Bowel sounds present. No organomegaly or  mass.  EXTREMITIES: No pedal edema, cyanosis, or clubbing.  NEUROLOGIC: Cranial nerves II through XII are intact. Muscle strength 5/5 in all extremities. Sensation intact. Gait not checked.  PSYCHIATRIC: The patient is alert and oriented x 3.  SKIN: No obvious rash, lesion, or ulcer.   Physical Exam LABORATORY PANEL:   CBC Recent Labs  Lab 03/26/18 2326  WBC 3.2*  HGB 11.8*  HCT 35.9*  PLT 141*   ------------------------------------------------------------------------------------------------------------------  Chemistries  Recent Labs  Lab 03/26/18 1701 03/26/18 2326  NA 134* 134*  K 3.9 3.3*  CL 93* 97*  CO2 30 26  GLUCOSE 99 185*  BUN 24* 22*  CREATININE 0.70 0.64  CALCIUM 9.1 8.5*  AST 41  --   ALT 31  --   ALKPHOS 165*  --   BILITOT 0.9  --    ------------------------------------------------------------------------------------------------------------------  Cardiac Enzymes Recent Labs  Lab 03/26/18 1704  TROPONINI 0.03*   ------------------------------------------------------------------------------------------------------------------  RADIOLOGY:  Ct Head Wo Contrast  Result Date: 03/26/2018 CLINICAL DATA:  Weak for several days with history of fall EXAM: CT HEAD WITHOUT CONTRAST TECHNIQUE: Contiguous axial images were obtained from the base of the skull through the vertex without intravenous contrast. COMPARISON:  08/19/2016 head CT FINDINGS: Brain: No evidence of acute infarction, hemorrhage, hydrocephalus, extra-axial collection or mass lesion/mass effect. Vascular: No hyperdense vessel or unexpected calcification. Skull: Normal. Negative for fracture or focal lesion. Sinuses/Orbits: Mucosal thickening and fluid within the sphenoid ethmoid maxillary and frontal sinuses. Other: None IMPRESSION: 1. Negative non contrasted CT appearance of the brain 2. Pansinusitis Electronically Signed   By: 08/21/2016 M.D.   On: 03/26/2018 17:57  Ct Angio Chest Pe W  And/or Wo Contrast  Result Date: 03/26/2018 CLINICAL DATA:  Vomiting times 10 days with malaise.  Cachectic. EXAM: CT ANGIOGRAPHY CHEST CT ABDOMEN AND PELVIS WITH CONTRAST TECHNIQUE: Multidetector CT imaging of the chest was performed using the standard protocol during bolus administration of intravenous contrast. Multiplanar CT image reconstructions and MIPs were obtained to evaluate the vascular anatomy. Multidetector CT imaging of the abdomen and pelvis was performed using the standard protocol during bolus administration of intravenous contrast. CONTRAST:  21mL ISOVUE-370 IOPAMIDOL (ISOVUE-370) INJECTION 76% COMPARISON:  12/26/2012 CT abdomen and pelvis, chest CT 02/05/2016 FINDINGS: CTA CHEST FINDINGS Cardiovascular: Heart size is normal without pericardial effusion. Mild aortic and coronary arteriosclerosis. No aneurysm. No acute pulmonary embolus. Mediastinum/Nodes: Redemonstration of a 15 x 13 mm solid appearing posterior left thyroid nodule, unchanged in size since 02/05/2016 by can be further correlated with ultrasound on a nonemergent basis. No thyromegaly. Patent midline trachea and mainstem bronchi. Esophagus is unremarkable. Mediastinal and hilar lymphadenopathy, slightly increased size since prior especially within the hila. The largest right hilar lymph node measures approximately 10 mm short axis, the largest left hilar lymph node measures approximately 11 mm short axis, subcarinal lymph node measures 16 mm short axis (versus 11 mm), right lower paratracheal 12 mm (versus 11 mm) and left paratracheal 16 mm (previously not apparent). Lungs/Pleura: Diffuse centrilobular emphysema is redemonstrated, upper lobe predominant. The following nodular opacities are noted. 1. Stable 6.1 mm (previously 6.2 mm) in average left upper lobe pulmonary nodule unchanged from prior, series 6/18. 2. Stable 3.4 mm left upper lobe nodule versus 3.8 mm previously, series 6/18. 3. Stable spiculated density in the right  upper lobe without significant change likely represent scarring, series 6/14. 4. Stable 3.8 mm nodular opacity along the minor fissure possibly a fissural lymph node, series 6/68. 5. New left lower lobe posterior basal segment cluster of tiny nodules, the largest approximately 6 mm. Findings may be postinfectious or inflammatory in etiology given faintly discernible pulmonary nodules of similar size and morphology in the left lower lobe and lingula. Series 6/50 and 57. 6. Subpleural opacity in the left upper lobe anteriorly, series 6/49 may represent an area of postinfectious change atelectasis. 7. Lingular 3.2 mm nodular opacity is stable. Additional subpleural nodule on series 6/66 appears to be calcifying and may reflect a small granuloma. This measures approximately 4.5 mm, series 6/66. 8. Subpleural left lower lobe medial basal segment 15 mm opacity is new and could represent a small focus of pneumonia. Neoplasm is not excluded. Short-term interval follow-up is therefore recommended after trial of antibiotics. Similar more confluent opacity in the right lower lobe posteriorly. Mild peribronchial and peribronchiolar thickening is identified within the right middle and lower lobes with subpleural areas of pulmonary opacity are noted likely postinfectious or postinflammatory in etiology. Musculoskeletal: No chest wall abnormality. No acute or significant osseous findings. Review of the MIP images confirms the above findings. CT ABDOMEN and PELVIS FINDINGS Hepatobiliary: The gallbladder is nondistended and free of stones. No space-occupying mass of the liver. No choledocholithiasis. Slight nonspecific intra hepatic ductal dilatation. Pancreas: Atrophic without mass or inflammation. Spleen: No splenomegaly or mass. Adrenals/Urinary Tract: 2 cm left upper pole renal cyst unchanged with smaller bilateral interpolar renal cysts noted, the next largest in the left kidney measuring up to 1.2 cm. Nonobstructing  right-sided renal calculi, the largest measuring 4 mm. No hydroureteronephrosis. Unremarkable urinary bladder for degree of distention. Stomach/Bowel: Stomach is physiologically distended. There is normal bowel  rotation. Mild gas and fluid-filled distention of small bowel loops with transmural thickening and fold thickening is identified. No mechanical source of bowel obstruction. Findings would be consistent with small bowel enteritis. The colon is unremarkable without significant mural thickening or obstruction. Normal-appearing appendix. Vascular/Lymphatic: Moderate aortoiliac atherosclerosis without aneurysm. Reproductive: Enlarged prostate with peripheral zone calcifications and heterogeneous appearance along the peripheral zone on the left. Discrete mass is not identified however. Other: No free air nor free fluid. Musculoskeletal: No aggressive osseous lesions. Review of the MIP images confirms the above findings. IMPRESSION: Chest CT: 1. No acute pulmonary embolus. 2. Upper lobe predominant centrilobular emphysema with stable left upper lobe, right upper lobe and lingular nodules. New confluent opacities in both lower lobes right greater than left are identified which may be postinfectious or inflammatory in etiology given peribronchial thickening and tiny tree-in-bud opacities. Subpleural areas of postinfectious or inflammatory change also noted in the right middle lobe and anterior right upper lobe. 3. Irregular pulmonary opacity in the medial left lower lobe is is new in appearance in though findings could be related to a postinfectious or inflammatory etiology, short-term interval follow-up to assure resolution is recommended and to exclude neoplasm in 4-6 weeks after treating for pneumonia. This measures approximately 15 mm in diameter. CT AP: 1. Bilateral renal cysts. Nonobstructing right interpolar renal calculus. 2. Findings compatible with diffuse small bowel enteritis without mechanical bowel  obstruction. Normal appendix. 3. Mild enlargement of the prostate with peripheral zone calcifications. Electronically Signed   By: Tollie Eth M.D.   On: 03/26/2018 19:26   Ct Abdomen Pelvis W Contrast  Result Date: 03/26/2018 CLINICAL DATA:  Vomiting times 10 days with malaise.  Cachectic. EXAM: CT ANGIOGRAPHY CHEST CT ABDOMEN AND PELVIS WITH CONTRAST TECHNIQUE: Multidetector CT imaging of the chest was performed using the standard protocol during bolus administration of intravenous contrast. Multiplanar CT image reconstructions and MIPs were obtained to evaluate the vascular anatomy. Multidetector CT imaging of the abdomen and pelvis was performed using the standard protocol during bolus administration of intravenous contrast. CONTRAST:  80mL ISOVUE-370 IOPAMIDOL (ISOVUE-370) INJECTION 76% COMPARISON:  12/26/2012 CT abdomen and pelvis, chest CT 02/05/2016 FINDINGS: CTA CHEST FINDINGS Cardiovascular: Heart size is normal without pericardial effusion. Mild aortic and coronary arteriosclerosis. No aneurysm. No acute pulmonary embolus. Mediastinum/Nodes: Redemonstration of a 15 x 13 mm solid appearing posterior left thyroid nodule, unchanged in size since 02/05/2016 by can be further correlated with ultrasound on a nonemergent basis. No thyromegaly. Patent midline trachea and mainstem bronchi. Esophagus is unremarkable. Mediastinal and hilar lymphadenopathy, slightly increased size since prior especially within the hila. The largest right hilar lymph node measures approximately 10 mm short axis, the largest left hilar lymph node measures approximately 11 mm short axis, subcarinal lymph node measures 16 mm short axis (versus 11 mm), right lower paratracheal 12 mm (versus 11 mm) and left paratracheal 16 mm (previously not apparent). Lungs/Pleura: Diffuse centrilobular emphysema is redemonstrated, upper lobe predominant. The following nodular opacities are noted. 1. Stable 6.1 mm (previously 6.2 mm) in average left  upper lobe pulmonary nodule unchanged from prior, series 6/18. 2. Stable 3.4 mm left upper lobe nodule versus 3.8 mm previously, series 6/18. 3. Stable spiculated density in the right upper lobe without significant change likely represent scarring, series 6/14. 4. Stable 3.8 mm nodular opacity along the minor fissure possibly a fissural lymph node, series 6/68. 5. New left lower lobe posterior basal segment cluster of tiny nodules, the largest approximately 6 mm. Findings  may be postinfectious or inflammatory in etiology given faintly discernible pulmonary nodules of similar size and morphology in the left lower lobe and lingula. Series 6/50 and 57. 6. Subpleural opacity in the left upper lobe anteriorly, series 6/49 may represent an area of postinfectious change atelectasis. 7. Lingular 3.2 mm nodular opacity is stable. Additional subpleural nodule on series 6/66 appears to be calcifying and may reflect a small granuloma. This measures approximately 4.5 mm, series 6/66. 8. Subpleural left lower lobe medial basal segment 15 mm opacity is new and could represent a small focus of pneumonia. Neoplasm is not excluded. Short-term interval follow-up is therefore recommended after trial of antibiotics. Similar more confluent opacity in the right lower lobe posteriorly. Mild peribronchial and peribronchiolar thickening is identified within the right middle and lower lobes with subpleural areas of pulmonary opacity are noted likely postinfectious or postinflammatory in etiology. Musculoskeletal: No chest wall abnormality. No acute or significant osseous findings. Review of the MIP images confirms the above findings. CT ABDOMEN and PELVIS FINDINGS Hepatobiliary: The gallbladder is nondistended and free of stones. No space-occupying mass of the liver. No choledocholithiasis. Slight nonspecific intra hepatic ductal dilatation. Pancreas: Atrophic without mass or inflammation. Spleen: No splenomegaly or mass. Adrenals/Urinary  Tract: 2 cm left upper pole renal cyst unchanged with smaller bilateral interpolar renal cysts noted, the next largest in the left kidney measuring up to 1.2 cm. Nonobstructing right-sided renal calculi, the largest measuring 4 mm. No hydroureteronephrosis. Unremarkable urinary bladder for degree of distention. Stomach/Bowel: Stomach is physiologically distended. There is normal bowel rotation. Mild gas and fluid-filled distention of small bowel loops with transmural thickening and fold thickening is identified. No mechanical source of bowel obstruction. Findings would be consistent with small bowel enteritis. The colon is unremarkable without significant mural thickening or obstruction. Normal-appearing appendix. Vascular/Lymphatic: Moderate aortoiliac atherosclerosis without aneurysm. Reproductive: Enlarged prostate with peripheral zone calcifications and heterogeneous appearance along the peripheral zone on the left. Discrete mass is not identified however. Other: No free air nor free fluid. Musculoskeletal: No aggressive osseous lesions. Review of the MIP images confirms the above findings. IMPRESSION: Chest CT: 1. No acute pulmonary embolus. 2. Upper lobe predominant centrilobular emphysema with stable left upper lobe, right upper lobe and lingular nodules. New confluent opacities in both lower lobes right greater than left are identified which may be postinfectious or inflammatory in etiology given peribronchial thickening and tiny tree-in-bud opacities. Subpleural areas of postinfectious or inflammatory change also noted in the right middle lobe and anterior right upper lobe. 3. Irregular pulmonary opacity in the medial left lower lobe is is new in appearance in though findings could be related to a postinfectious or inflammatory etiology, short-term interval follow-up to assure resolution is recommended and to exclude neoplasm in 4-6 weeks after treating for pneumonia. This measures approximately 15 mm in  diameter. CT AP: 1. Bilateral renal cysts. Nonobstructing right interpolar renal calculus. 2. Findings compatible with diffuse small bowel enteritis without mechanical bowel obstruction. Normal appendix. 3. Mild enlargement of the prostate with peripheral zone calcifications. Electronically Signed   By: Tollie Eth M.D.   On: 03/26/2018 19:26   Dg Chest Port 1 View  Result Date: 03/26/2018 CLINICAL DATA:  Generalized weakness with vomiting EXAM: PORTABLE CHEST 1 VIEW COMPARISON:  08/20/2016 FINDINGS: Hyperinflation. No focal opacity or pleural effusion. Normal heart size. No pneumothorax. IMPRESSION: No active disease.  Hyperinflation. Electronically Signed   By: Jasmine Pang M.D.   On: 03/26/2018 18:05    ASSESSMENT AND  PLAN:  Patient is a 62 year old with history of rheumatoid arthritis nicotine abuse presenting with multiple complaints  *Acute C. difficile colitis  P.o. Flagyl for 14-day course, Lactinex 3 times daily, strict I&O monitoring   *Acute on chronic pain syndrome  Noted history of poly-illicit drug abuse  Start Neurontin 3 times daily, Elavil at bedtime, Flexeril as needed muscle spasms   *Acute bronchitis  Resolving  Discontinue Levaquin, continue Mucinex 3 times daily, discontinue IV Solu-Medrol, start prednisone taper   *Chronic tobacco smoking abuse/dependency  Nicotine patch with cessation counseling ordered   *Acute on COPD exacerbation  Largely resolved  Steroids per above, breathing treatments as needed   *Chronic Rheumatoid arthritis Improved Sed rate 78 Will need to follow-up with rheumatology status post discharge, currently on steroids  *Malnourished appearance Suspect secondary to moderate to severe protein calorie/energy malnutrition which is compounded by chronic poly-illicit drug abuse Dietary consulted, check prealbumin level  *Chronic poly-illicit drug abuse Cessation counseling while in house  All the records are reviewed and case discussed  with Care Management/Social Workerr. Management plans discussed with the patient, family and they are in agreement.  CODE STATUS: full  TOTAL TIME TAKING CARE OF THIS PATIENT: 35 minutes.     POSSIBLE D/C IN 1-3 DAYS, DEPENDING ON CLINICAL CONDITION.   Evelena Asa Salary M.D on 03/27/2018   Between 7am to 6pm - Pager - [phone]  After 6pm go to [email or web address] - password [email or web address]  [facility] [place] [facility]  Office  [phone]  CC: Primary care physician; Patient, No Pcp Per  Note: This dictation was prepared with Dragon dictation along with smaller phrase technology. Any transcriptional errors that result from this process are unintentional.

## 2018-03-28 DIAGNOSIS — E43 Unspecified severe protein-calorie malnutrition: Secondary | ICD-10-CM

## 2018-03-28 LAB — URINE CULTURE: CULTURE: NO GROWTH

## 2018-03-28 LAB — BASIC METABOLIC PANEL
Anion gap: 10 (ref 5–15)
Anion gap: 10 (ref 5–15)
BUN: 29 mg/dL — ABNORMAL HIGH (ref 6–20)
BUN: 28 mg/dL — AB (ref 6–20)
CALCIUM: 8 mg/dL — AB (ref 8.9–10.3)
CALCIUM: 8.8 mg/dL — AB (ref 8.9–10.3)
CO2: 27 mmol/L (ref 22–32)
CO2: 30 mmol/L (ref 22–32)
CREATININE: 0.68 mg/dL (ref 0.61–1.24)
CREATININE: 0.55 mg/dL — AB (ref 0.61–1.24)
Chloride: 102 mmol/L (ref 101–111)
Chloride: 100 mmol/L — ABNORMAL LOW (ref 101–111)
GFR calc Af Amer: >60 mL/min (ref >60)
GFR calc non Af Amer: >60 mL/min (ref >60)
Glucose, Bld: 179 mg/dL — ABNORMAL HIGH (ref 65–99)
Glucose, Bld: 207 mg/dL — ABNORMAL HIGH (ref 65–99)
Potassium: 3 mmol/L — ABNORMAL LOW (ref 3.5–5.1)
Potassium: 3.3 mmol/L — ABNORMAL LOW (ref 3.5–5.1)
Sodium: 139 mmol/L (ref 135–145)
Sodium: 140 mmol/L (ref 135–145)

## 2018-03-28 LAB — HIV ANTIBODY (ROUTINE TESTING W REFLEX): HIV Screen 4th Generation wRfx: NONREACTIVE

## 2018-03-28 LAB — PHOSPHORUS: Phosphorus: 2.2 mg/dL — ABNORMAL LOW (ref 2.5–4.6)

## 2018-03-28 LAB — MAGNESIUM
Magnesium: 1.2 mg/dL — ABNORMAL LOW (ref 1.7–2.4)
Magnesium: 1.9 mg/dL (ref 1.7–2.4)

## 2018-03-28 LAB — T3, FREE: T3, Free: 19.5 pg/mL — ABNORMAL HIGH (ref 2.0–4.4)

## 2018-03-28 LAB — RHEUMATOID FACTOR: RHEUMATOID FACTOR: 22.6 [IU]/mL — AB (ref 0.0–13.9)

## 2018-03-28 MED ORDER — POTASSIUM CHLORIDE CRYS ER 20 MEQ PO TBCR
20.0000 meq | EXTENDED_RELEASE_TABLET | ORAL | Status: AC
  Administered 2018-03-28 (×2): 20 meq via ORAL
  Filled 2018-03-28 (×2): qty 1

## 2018-03-28 MED ORDER — LEVALBUTEROL HCL 0.63 MG/3ML IN NEBU
0.6300 mg | INHALATION_SOLUTION | Freq: Three times a day (TID) | RESPIRATORY_TRACT | Status: DC
  Administered 2018-03-28 – 2018-03-30 (×7): 0.63 mg via RESPIRATORY_TRACT
  Filled 2018-03-28 (×8): qty 3

## 2018-03-28 MED ORDER — ADULT MULTIVITAMIN W/MINERALS CH
1.0000 | ORAL_TABLET | Freq: Every day | ORAL | Status: DC
  Administered 2018-03-28 – 2018-03-30 (×3): 1 via ORAL
  Filled 2018-03-28 (×3): qty 1

## 2018-03-28 MED ORDER — ENSURE ENLIVE PO LIQD
237.0000 mL | Freq: Three times a day (TID) | ORAL | Status: DC
  Administered 2018-03-28 – 2018-03-30 (×7): 237 mL via ORAL

## 2018-03-28 MED ORDER — MAGNESIUM SULFATE 4 GM/100ML IV SOLN
4.0000 g | Freq: Once | INTRAVENOUS | Status: AC
  Administered 2018-03-28: 4 g via INTRAVENOUS
  Filled 2018-03-28: qty 100

## 2018-03-28 MED ORDER — MAGNESIUM SULFATE 4 GM/100ML IV SOLN
4.0000 g | Freq: Once | INTRAVENOUS | Status: DC
  Filled 2018-03-28: qty 100

## 2018-03-28 MED ORDER — POTASSIUM PHOSPHATES 15 MMOLE/5ML IV SOLN
30.0000 mmol | Freq: Once | INTRAVENOUS | Status: DC
  Filled 2018-03-28: qty 10

## 2018-03-28 MED ORDER — IPRATROPIUM BROMIDE 0.02 % IN SOLN: 0.5000 mg | RESPIRATORY_TRACT | Status: DC | PRN

## 2018-03-28 NOTE — Progress Notes (Signed)
MEDICATION RELATED CONSULT NOTE - INITIAL   Pharmacy Consult for electrolyte replacement Indication: hypokalemia and risk for refeeding  Allergies  Allergen Reactions  . Norco [Hydrocodone-Acetaminophen] Itching and Rash    Patient Measurements: Height: 5\' 6"  (167.6 cm) Weight: 84 lb 11.2 oz (38.4 kg) IBW/kg (Calculated) : 63.8 Adjusted Body Weight:   Vital Signs: Temp: 97.4 F (36.3 C) (04/28 0624) Temp Source: Oral (04/28 0624) BP: 149/65 (04/28 0624) Pulse Rate: 91 (04/28 0759) Intake/Output from previous day: 04/27 0701 - 04/28 0700 In: 0  Out: 502 [Urine:501; Stool:1] Intake/Output from this shift: No intake/output data recorded.  Labs: Recent Labs    03/26/18 1701 03/26/18 2326  WBC 8.3 3.2*  HGB 13.7 11.8*  HCT 41.0 35.9*  PLT 176 141*  CREATININE 0.70 0.64  ALBUMIN 3.1*  --   PROT 8.1  --   AST 41  --   ALT 31  --   ALKPHOS 165*  --   BILITOT 0.9  --    Estimated Creatinine Clearance: 51.3 mL/min (by C-G formula based on SCr of 0.64 mg/dL).   Microbiology: Recent Results (from the past 720 hour(s))  Blood Culture (routine x 2)     Status: None (Preliminary result)   Collection Time: 03/26/18  5:24 PM  Result Value Ref Range Status   Specimen Description BLOOD RT [facility]  Final   Special Requests   Final    BOTTLES DRAWN AEROBIC AND ANAEROBIC Blood Culture results may not be optimal due to an excessive volume of blood received in culture bottles   Culture   Final    NO GROWTH 2 DAYS Performed at [facility], [address] [place], [place]    Report Status PENDING  Incomplete  Urine culture     Status: None   Collection Time: 03/26/18  5:32 PM  Result Value Ref Range Status   Specimen Description   Final    URINE, RANDOM Performed at [facility], [address], [place], [place]    Special Requests   Final    NONE Performed at [facility], [address] [place], [place]     Culture   Final    NO GROWTH Performed at [facility], [address], [place], [place]    Report Status 03/28/2018 FINAL  Final  Blood Culture (routine x 2)     Status: None (Preliminary result)   Collection Time: 03/26/18  5:37 PM  Result Value Ref Range Status   Specimen Description BLOOD LT FA  Final   Special Requests   Final    BOTTLES DRAWN AEROBIC AND ANAEROBIC Blood Culture adequate volume   Culture   Final    NO GROWTH 2 DAYS Performed at [facility], [address], [place], [place]    Report Status PENDING  Incomplete  C difficile quick scan w PCR reflex     Status: Abnormal   Collection Time: 03/27/18  1:33 AM  Result Value Ref Range Status   C Diff antigen POSITIVE (A) NEGATIVE Final   C Diff toxin NEGATIVE NEGATIVE Final   C Diff interpretation Results are indeterminate. See PCR results.  Final    Comment: Performed at [facility], [address] [place], [place]  C. Diff by PCR, Reflexed     Status: Abnormal   Collection Time: 03/27/18  1:33 AM  Result Value Ref Range Status   Toxigenic C. Difficile  by PCR POSITIVE (A) NEGATIVE Final    Comment: Positive for toxigenic C. difficile with little to no toxin production. Only treat if clinical presentation suggests symptomatic illness. Performed at [facility], [address], [place], [place] [address]     Medical History: Past Medical History:  Diagnosis Date  . Collagen vascular disease (HCC)   . Hyperlipidemia   . Myocardial infarct (HCC)   . Rheumatoid arthritis(714.0)     Medications:  Infusions:  . sodium chloride 30 mL/hr at 03/27/18 6440    Assessment: 62 yom cc aching and emesis, diarrhea x 7 to 10 days. CDI PCR positive now on oral vancomycin. K noted to be low on 4/26 but no replacement noted. Dietitian is starting Ensure Enlive and consults pharmacy to manage electrolytes due to refeeding risk.  Goal of Therapy:  K 3.5 to  5 Ca 8.9 to 10.3 Mg 1.7 to 2.4 Phos 2.5 to 4.6  Plan:  Will check electrolytes today then daily x 3 days per RD recommendation. Pharmacy will monitor and supplement per protocol.  Carola Frost, Pharm.D., BCPS Clinical Pharmacist 03/28/2018,10:02 AM

## 2018-03-28 NOTE — Progress Notes (Signed)
[employer] - [place] [facility]   PATIENT NAME: Paul Day    MR#:  [id number]  DATE OF BIRTH:  17-Jul-1956  SUBJECTIVE:  CHIEF COMPLAINT:   Chief Complaint  Patient presents with  . aching all over  . Emesis  Patient continues to complain of watery diarrhea, family at the bedside, no severe malnutrition, dietary following  REVIEW OF SYSTEMS:  CONSTITUTIONAL: No fever, fatigue or weakness.  EYES: No blurred or double vision.  EARS, NOSE, AND THROAT: No tinnitus or ear pain.  RESPIRATORY: No cough, shortness of breath, wheezing or hemoptysis.  CARDIOVASCULAR: No chest pain, orthopnea, edema.  GASTROINTESTINAL: No nausea, vomiting, diarrhea or abdominal pain.  GENITOURINARY: No dysuria, hematuria.  ENDOCRINE: No polyuria, nocturia,  HEMATOLOGY: No anemia, easy bruising or bleeding SKIN: No rash or lesion. MUSCULOSKELETAL: No joint pain or arthritis.   NEUROLOGIC: No tingling, numbness, weakness.  PSYCHIATRY: No anxiety or depression.   ROS  DRUG ALLERGIES:   Allergies  Allergen Reactions  . Norco [Hydrocodone-Acetaminophen] Itching and Rash    VITALS:  Blood pressure (!) 149/65, pulse 91, temperature (!) 97.4 F (36.3 C), temperature source Oral, resp. rate 16, height 5\' 6"  (1.676 m), weight 38.4 kg (84 lb 11.2 oz), SpO2 97 %.  PHYSICAL EXAMINATION:  GENERAL:  62 y.o.-year-old patient lying in the bed with no acute distress.  EYES: Pupils equal, round, reactive to light and accommodation. No scleral icterus. Extraocular muscles intact.  HEENT: Head atraumatic, normocephalic. Oropharynx and nasopharynx clear.  NECK:  Supple, no jugular venous distention. No thyroid enlargement, no tenderness.  LUNGS: Normal breath sounds bilaterally, no wheezing, rales,rhonchi or crepitation. No use of accessory muscles of respiration.  CARDIOVASCULAR: S1, S2 normal. No murmurs, rubs, or gallops.  ABDOMEN: Soft, nontender, nondistended. Bowel sounds present. No  organomegaly or mass.  EXTREMITIES: No pedal edema, cyanosis, or clubbing.  NEUROLOGIC: Cranial nerves II through XII are intact. Muscle strength 5/5 in all extremities. Sensation intact. Gait not checked.  PSYCHIATRIC: The patient is alert and oriented x 3.  SKIN: No obvious rash, lesion, or ulcer.   Physical Exam LABORATORY PANEL:   CBC Recent Labs  Lab 03/26/18 2326  WBC 3.2*  HGB 11.8*  HCT 35.9*  PLT 141*   ------------------------------------------------------------------------------------------------------------------  Chemistries  Recent Labs  Lab 03/26/18 1701 03/26/18 2326  NA 134* 134*  K 3.9 3.3*  CL 93* 97*  CO2 30 26  GLUCOSE 99 185*  BUN 24* 22*  CREATININE 0.70 0.64  CALCIUM 9.1 8.5*  AST 41  --   ALT 31  --   ALKPHOS 165*  --   BILITOT 0.9  --    ------------------------------------------------------------------------------------------------------------------  Cardiac Enzymes Recent Labs  Lab 03/26/18 1704  TROPONINI 0.03*   ------------------------------------------------------------------------------------------------------------------  RADIOLOGY:  Ct Head Wo Contrast  Result Date: 03/26/2018 CLINICAL DATA:  Weak for several days with history of fall EXAM: CT HEAD WITHOUT CONTRAST TECHNIQUE: Contiguous axial images were obtained from the base of the skull through the vertex without intravenous contrast. COMPARISON:  08/19/2016 head CT FINDINGS: Brain: No evidence of acute infarction, hemorrhage, hydrocephalus, extra-axial collection or mass lesion/mass effect. Vascular: No hyperdense vessel or unexpected calcification. Skull: Normal. Negative for fracture or focal lesion. Sinuses/Orbits: Mucosal thickening and fluid within the sphenoid ethmoid maxillary and frontal sinuses. Other: None IMPRESSION: 1. Negative non contrasted CT appearance of the brain 2. Pansinusitis Electronically Signed   By: 08/21/2016 M.D.   On: 03/26/2018 17:57   Ct Angio  Chest Pe W And/or Wo Contrast  Result Date: 03/26/2018 CLINICAL DATA:  Vomiting times 10 days with malaise.  Cachectic. EXAM: CT ANGIOGRAPHY CHEST CT ABDOMEN AND PELVIS WITH CONTRAST TECHNIQUE: Multidetector CT imaging of the chest was performed using the standard protocol during bolus administration of intravenous contrast. Multiplanar CT image reconstructions and MIPs were obtained to evaluate the vascular anatomy. Multidetector CT imaging of the abdomen and pelvis was performed using the standard protocol during bolus administration of intravenous contrast. CONTRASTYetta BarreYetta BarLupCenter For SpeciaHuVa Central Alabama Healthcare System - MontgomerJimmey Merit Health Women'S HKoreCentral State HospiYetta BarreYetta BarLupAllegiance Health Center PermHuDavis Eye Center InJimmey Pam Specialty Hospital OfKoreAcadian Medical Center (A Campus Of Mercy Regional Medical CenYetta BarreYetta BarLupThe Endoscopy Center OfHuSleepy Eye Medical CenteJimmey SurgisiteKoreEating Recovery Center Behavioral HeaYetta BarreYetta BarLupSquaw Peak Surgical FacHuEndoscopy Center Of Connecticut LLJimmey Valley Health Winchester MedicalKorePalms Surgery CenterYetta BarreYetta BarLupRiverside Doctors' Hospital WilHuBethel Park Surgery CenteJimmey Research Medical Center - BrooksideKoreUpmc CarliYetta BarreYetta BarLupCobalt Rehabilitation HospiHuDecatur County Memorial HospitaJimmey Kearney Ambulatory Surgical Center LLC Dba Heartland SurgeryKorePinecrest Eye Center Yetta BarreYetta BarLupEndoscopic Ambulatory Specialty Center Of Bay HuCitrus Valley Medical Center - Qv CampuJimmey Centra Lynchburg General HKoreRenue Surgery Center Of WaycrYetta BarreYetta BarLupAtlanticare Surgery CHuBaylor Scott & White Medical Center - MckinneJimmey Exeter HKoreFort Lauderdale HospYetta BarreYetta BarLupNorthland Eye Surgery CHuCvp Surgery Centers Ivy PointJimmey Holzer MedicalKoreTranssouth Health Care Pc Dba Ddc Surgery CenYetta BarreYetta BarLupDiamond GroHuClinch Memorial HospitaJimmey Naab Road Surgery CenKoreWindmoor Healthcare Of ClearwaYetta BarreYetta BarLupAtlantic Surgical CHuValley West Community HospitaJimmey Salem Memorial District HKoreSelect Specialty Hospital Columbus EYetta BarreYetta BarLupAnmed Health RehabilitationHuSoutheastern Gastroenterology Endoscopy Center PJimmey Medical Center EndoscKoreBluegrass Orthopaedics Surgical Division Yetta BarreYetta BarLupAtlanticare Center For OrthopediHuRocky Mountain Surgery Center LLJimmey West Plains Ambulatory Surgery KoreWellington Regional Medical CenYetta BarreYetta BarLupMemorialcare Orange Coast MedicHuAurelia Osborn Fox Memorial HospitaJimmey Bourbon Community HKoreChalmers P. Wylie Va Ambulatory Care CenYetta BarreYetta BarLupThree Rivers Endoscopy CHuSaint Thomas Hickman HospitaJimmey Essentia HealthKoreBrooks Tlc Hospital SystemsYetta BarreYetta BarLupCovenant Hospital HuWilson N Jones Regional Medical CenteJimmey Eastern Plumas Hospital-PortolaKoreNorthern Plains Surgery CenterYetta BarreYetta BarLupSaint MichaelsHuLincoln County HospitaJimmey Jim Taliaferro Community Mental HealthKoreBanner Union Hills Surgery CenYetta BarreYetta BarLupBuena Vista Regional MedicHuQuad City Endoscopy LLJimmey Bronx-Lebanon Hospital Center - Concourse DKoreHosp Psiquiatria Forense De PoYetta BarreYetta BarLupEyecare Consultants Surgery CHuDefiance Regional Medical CenteJimmey Gulfport Behavioral HealthKoreSpartan Health SurgicenterYetta BarreYetta BarLupAurora Vista Del MarHuMenomonee Falls Ambulatory Surgery CenteJimmey Mason General HKoreAscension Providence Rochester HospiYetta BarrYetta BarLStonegate Surgery Center LJimmey Banner Heart HospitalYetta BarreYetta BarLupLoma Linda University MedicHuSjrh - St Johns DivisioJimmey Insight Surgery And Laser CenKoreHawaii State HospiYetta BarreYetta BarLupUnitypoint Healthcare-FinleyHuMountain West Medical CenteJimmey West Michigan Surgery CenKoreNovamed Surgery Center Of Oak Lawn LLC Dba Center For Reconstructive SurYetta BarreYetta BarLupBaptistHuPikes Peak Endoscopy And Surgery Center LLJimmey Tmc Healthcare Center For GeKoreSelect Specialty Hospital - NashvYetta BarreYetta BarLupUp Health System - HuMountains Community HospitaJimmey Mercy MedicalKoreUnited Medical Rehabilitation HospiYetta BarreYetta BarLupNorth RunnelsHuNorth Shore Medical Center - Union CampuJimmey Lake Regional HealthKore2020 Surgery Center Yetta BarreYetta BarLupLittle River MemorialHuDonalsonville HospitaJimmey Guam SurgicenKoreThe Corpus Christi Medical Center - Doctors RegioYetta BarreYetta BarLupKessler Institute For Rehabilitation HuDoctors HospitaJimmey Memorial Hermann Northeast HKoreOmaha Surgical CeYetta BarreYetta BarLupOu Medical Center -The Children'SHuSt. Luke'S Meridian Medical CenteJimmey Prosser Memorial HKoreBeaumont Hospital Farmington HiYetta BarreYetta BarLupBethany Medical HuEmbassy Surgery CenteJimmey Surgery Center Of Columbia CoKoreExecutive Surgery CenterYetta BarreYetta BarLupSt Vincent MercyHuKingwood Pines HospitaJimmey Specialists One Day Surgery LLC Dba Specialists One Day KoreDanbury HospiYetta BarreYetta BarLupMiddle Tennessee Ambulatory SurgeHuNorwood Endoscopy Center LLJimmey Idaho State HospitaKoreCollege Medical CenYetta BarreYetta BarLupPih HospitalHuTaylorville Memorial HospitaJimmey Baylor Institute For Rehabilitation At NorthwestKoreDayton Children'S HospiYetta BarreYetta BarLupSutter Health Palo Alto Medical FHuAdventhealth Altamonte SpringJimmey Kettering Health Network Troy HKoreHosp General Menonita - AibonYetta BarreYetta BarLupMineral Area Regional MedicHuReynolds Army Community HospitaJimmey Madera Ambulatory EndoscopyKoreEssentia Health SandstYetta BarreYetta BarLupMary S. Harper Geriatric PsychiatHuSkyline HospitaJimmey Tria Orthopaedic CenKoreLutheran HospiYetta BarreYetta BarLupSt Charles SurgicHuThe Auberge At Aspen Park-A Memory Care CommunitJimmey Henry Ford [facility] Center KoreSan Diego Eye Cor Yetta BarreYetta BarLupLandmark Hospital HuNew Milford HospitaJimmey Fairview HKoreMt Sinai Hospital Medical CenYetta BarreYetta BarLupVictory Medical Center CrHuAvail Health Lake Charles HospitaJimmey Hshs Holy Family HospiKoreMilford Regional Medical CenYetta BarreYetta BarLupHarvard Park Surgery CHuBismarck Surgical Associates LLJimmey Central Peninsula General HKoreBronx Psychiatric CenYetta BarreYetta BarLupDunes SurgicalHuWythe County Community HospitaJimmey Villages Endoscopy CenKoreWayne County Hospitalater LGordy SaversMetro ([id number]) INJECTION 76% COMPARISON:  12/26/2012 CT abdomen and pelvis, chest CT 02/05/2016 FINDINGS: CTA CHEST FINDINGS Cardiovascular: Heart size is normal without pericardial effusion. Mild aortic and coronary arteriosclerosis. No aneurysm. No acute pulmonary embolus. Mediastinum/Nodes: Redemonstration of a 15 x 13 mm solid appearing posterior left thyroid nodule, unchanged in size since 02/05/2016 by can be further correlated with ultrasound on a nonemergent basis. No thyromegaly. Patent midline trachea and mainstem bronchi. Esophagus is unremarkable. Mediastinal and hilar lymphadenopathy, slightly increased size since prior especially within the hila. The largest right hilar lymph node measures approximately 10 mm short axis, the largest left hilar lymph node measures approximately 11 mm short axis, subcarinal lymph node measures 16 mm short axis (versus 11 mm), right lower paratracheal 12 mm (versus 11 mm) and left paratracheal 16 mm (previously not apparent). Lungs/Pleura: Diffuse centrilobular emphysema is redemonstrated, upper lobe predominant. The following nodular opacities are noted. 1. Stable 6.1 mm (previously 6.2 mm) in average left upper lobe pulmonary nodule unchanged from prior, series 6/18. 2. Stable 3.4 mm left upper lobe nodule versus 3.8 mm previously, series 6/18. 3. Stable spiculated density in  the right upper lobe without significant change likely represent scarring, series 6/14. 4. Stable 3.8 mm nodular opacity along the minor fissure possibly a fissural lymph node, series 6/68. 5. New left lower lobe posterior basal segment cluster of tiny nodules, the largest approximately 6 mm. Findings may be postinfectious or inflammatory in etiology given faintly discernible pulmonary nodules of similar size and morphology in the left lower lobe and lingula. Series 6/50 and 57. 6. Subpleural opacity in the left upper lobe anteriorly, series 6/49 may represent an area of postinfectious change atelectasis. 7. Lingular 3.2 mm nodular opacity is stable. Additional subpleural nodule on series 6/66 appears to be calcifying and may reflect a small granuloma. This measures approximately 4.5 mm, series 6/66. 8. Subpleural left lower lobe medial basal segment 15 mm opacity is new and could represent a small focus of pneumonia. Neoplasm is not excluded. Short-term interval follow-up is therefore recommended after trial of antibiotics. Similar more confluent opacity in the right lower lobe posteriorly. Mild peribronchial and peribronchiolar thickening is identified within the right middle and lower lobes with subpleural areas of pulmonary opacity are noted likely postinfectious or postinflammatory in etiology. Musculoskeletal: No chest wall abnormality. No acute or significant osseous findings. Review of the MIP images confirms the above findings. CT ABDOMEN and PELVIS FINDINGS Hepatobiliary: The gallbladder is nondistended and free of stones. No space-occupying mass of the liver. No choledocholithiasis. Slight nonspecific intra hepatic ductal dilatation. Pancreas: Atrophic without mass or inflammation. Spleen: No splenomegaly or mass. Adrenals/Urinary Tract: 2 cm left upper pole renal cyst unchanged with smaller bilateral interpolar renal cysts noted, the next largest in the left kidney measuring up to 1.2 cm. Nonobstructing  right-sided renal calculi, the largest measuring 4 mm. No hydroureteronephrosis. Unremarkable urinary bladder for degree of distention. Stomach/Bowel: Stomach is physiologically distended. There is normal bowel rotation. Mild gas  and fluid-filled distention of small bowel loops with transmural thickening and fold thickening is identified. No mechanical source of bowel obstruction. Findings would be consistent with small bowel enteritis. The colon is unremarkable without significant mural thickening or obstruction. Normal-appearing appendix. Vascular/Lymphatic: Moderate aortoiliac atherosclerosis without aneurysm. Reproductive: Enlarged prostate with peripheral zone calcifications and heterogeneous appearance along the peripheral zone on the left. Discrete mass is not identified however. Other: No free air nor free fluid. Musculoskeletal: No aggressive osseous lesions. Review of the MIP images confirms the above findings. IMPRESSION: Chest CT: 1. No acute pulmonary embolus. 2. Upper lobe predominant centrilobular emphysema with stable left upper lobe, right upper lobe and lingular nodules. New confluent opacities in both lower lobes right greater than left are identified which may be postinfectious or inflammatory in etiology given peribronchial thickening and tiny tree-in-bud opacities. Subpleural areas of postinfectious or inflammatory change also noted in the right middle lobe and anterior right upper lobe. 3. Irregular pulmonary opacity in the medial left lower lobe is is new in appearance in though findings could be related to a postinfectious or inflammatory etiology, short-term interval follow-up to assure resolution is recommended and to exclude neoplasm in 4-6 weeks after treating for pneumonia. This measures approximately 15 mm in diameter. CT AP: 1. Bilateral renal cysts. Nonobstructing right interpolar renal calculus. 2. Findings compatible with diffuse small bowel enteritis without mechanical bowel  obstruction. Normal appendix. 3. Mild enlargement of the prostate with peripheral zone calcifications. Electronically Signed   By: Tollie Eth M.D.   On: 03/26/2018 19:26   Ct Abdomen Pelvis W Contrast  Result Date: 03/26/2018 CLINICAL DATA:  Vomiting times 10 days with malaise.  Cachectic. EXAM: CT ANGIOGRAPHY CHEST CT ABDOMEN AND PELVIS WITH CONTRAST TECHNIQUE: Multidetector CT imaging of the chest was performed using the standard protocol during bolus administration of intravenous contrast. Multiplanar CT image reconstructions and MIPs were obtained to evaluate the vascular anatomy. Multidetector CT imaging of the abdomen and pelvis was performed using the standard protocol during bolus administration of intravenous contrast. CONTRAST:  75mL ISOVUE-370 IOPAMIDOL ([id number]) INJECTION 76% COMPARISON:  12/26/2012 CT abdomen and pelvis, chest CT 02/05/2016 FINDINGS: CTA CHEST FINDINGS Cardiovascular: Heart size is normal without pericardial effusion. Mild aortic and coronary arteriosclerosis. No aneurysm. No acute pulmonary embolus. Mediastinum/Nodes: Redemonstration of a 15 x 13 mm solid appearing posterior left thyroid nodule, unchanged in size since 02/05/2016 by can be further correlated with ultrasound on a nonemergent basis. No thyromegaly. Patent midline trachea and mainstem bronchi. Esophagus is unremarkable. Mediastinal and hilar lymphadenopathy, slightly increased size since prior especially within the hila. The largest right hilar lymph node measures approximately 10 mm short axis, the largest left hilar lymph node measures approximately 11 mm short axis, subcarinal lymph node measures 16 mm short axis (versus 11 mm), right lower paratracheal 12 mm (versus 11 mm) and left paratracheal 16 mm (previously not apparent). Lungs/Pleura: Diffuse centrilobular emphysema is redemonstrated, upper lobe predominant. The following nodular opacities are noted. 1. Stable 6.1 mm (previously 6.2 mm) in average left  upper lobe pulmonary nodule unchanged from prior, series 6/18. 2. Stable 3.4 mm left upper lobe nodule versus 3.8 mm previously, series 6/18. 3. Stable spiculated density in the right upper lobe without significant change likely represent scarring, series 6/14. 4. Stable 3.8 mm nodular opacity along the minor fissure possibly a fissural lymph node, series 6/68. 5. New left lower lobe posterior basal segment cluster of tiny nodules, the largest approximately 6 mm. Findings may be postinfectious  or inflammatory in etiology given faintly discernible pulmonary nodules of similar size and morphology in the left lower lobe and lingula. Series 6/50 and 57. 6. Subpleural opacity in the left upper lobe anteriorly, series 6/49 may represent an area of postinfectious change atelectasis. 7. Lingular 3.2 mm nodular opacity is stable. Additional subpleural nodule on series 6/66 appears to be calcifying and may reflect a small granuloma. This measures approximately 4.5 mm, series 6/66. 8. Subpleural left lower lobe medial basal segment 15 mm opacity is new and could represent a small focus of pneumonia. Neoplasm is not excluded. Short-term interval follow-up is therefore recommended after trial of antibiotics. Similar more confluent opacity in the right lower lobe posteriorly. Mild peribronchial and peribronchiolar thickening is identified within the right middle and lower lobes with subpleural areas of pulmonary opacity are noted likely postinfectious or postinflammatory in etiology. Musculoskeletal: No chest wall abnormality. No acute or significant osseous findings. Review of the MIP images confirms the above findings. CT ABDOMEN and PELVIS FINDINGS Hepatobiliary: The gallbladder is nondistended and free of stones. No space-occupying mass of the liver. No choledocholithiasis. Slight nonspecific intra hepatic ductal dilatation. Pancreas: Atrophic without mass or inflammation. Spleen: No splenomegaly or mass. Adrenals/Urinary  Tract: 2 cm left upper pole renal cyst unchanged with smaller bilateral interpolar renal cysts noted, the next largest in the left kidney measuring up to 1.2 cm. Nonobstructing right-sided renal calculi, the largest measuring 4 mm. No hydroureteronephrosis. Unremarkable urinary bladder for degree of distention. Stomach/Bowel: Stomach is physiologically distended. There is normal bowel rotation. Mild gas and fluid-filled distention of small bowel loops with transmural thickening and fold thickening is identified. No mechanical source of bowel obstruction. Findings would be consistent with small bowel enteritis. The colon is unremarkable without significant mural thickening or obstruction. Normal-appearing appendix. Vascular/Lymphatic: Moderate aortoiliac atherosclerosis without aneurysm. Reproductive: Enlarged prostate with peripheral zone calcifications and heterogeneous appearance along the peripheral zone on the left. Discrete mass is not identified however. Other: No free air nor free fluid. Musculoskeletal: No aggressive osseous lesions. Review of the MIP images confirms the above findings. IMPRESSION: Chest CT: 1. No acute pulmonary embolus. 2. Upper lobe predominant centrilobular emphysema with stable left upper lobe, right upper lobe and lingular nodules. New confluent opacities in both lower lobes right greater than left are identified which may be postinfectious or inflammatory in etiology given peribronchial thickening and tiny tree-in-bud opacities. Subpleural areas of postinfectious or inflammatory change also noted in the right middle lobe and anterior right upper lobe. 3. Irregular pulmonary opacity in the medial left lower lobe is is new in appearance in though findings could be related to a postinfectious or inflammatory etiology, short-term interval follow-up to assure resolution is recommended and to exclude neoplasm in 4-6 weeks after treating for pneumonia. This measures approximately 15 mm in  diameter. CT AP: 1. Bilateral renal cysts. Nonobstructing right interpolar renal calculus. 2. Findings compatible with diffuse small bowel enteritis without mechanical bowel obstruction. Normal appendix. 3. Mild enlargement of the prostate with peripheral zone calcifications. Electronically Signed   By: Tollie Eth M.D.   On: 03/26/2018 19:26   Dg Chest Port 1 View  Result Date: 03/26/2018 CLINICAL DATA:  Generalized weakness with vomiting EXAM: PORTABLE CHEST 1 VIEW COMPARISON:  08/20/2016 FINDINGS: Hyperinflation. No focal opacity or pleural effusion. Normal heart size. No pneumothorax. IMPRESSION: No active disease.  Hyperinflation. Electronically Signed   By: Jasmine Pang M.D.   On: 03/26/2018 18:05    ASSESSMENT AND PLAN:  Patient  is a 62 year old with history of rheumatoid arthritis nicotine abuse presenting with multiple complaints  *Acute C. difficile colitis  Stable Continue p.o. Flagyl for 14-day course, Lactinex 3 times daily, strict I&O monitoring   *Acute on chronic pain syndrome  Much improved Noted history of poly-illicit drug abuse  Continue Neurontin, Elavil, Flexeril   *Acute bronchitis  Resolving  Continue Mucinex, prednisone taper   *Chronic tobacco smoking abuse/dependency  Stable Nicotine patch with cessation counseling ordered   *Acute on COPD exacerbation  Resolved  Steroids per above, breathing treatments as needed   *Chronic Rheumatoid arthritis Improved Sed rate 78 Will need to follow-up with rheumatology status post discharge, currently on steroids  *Acute severe protein calorie/energy malnutrition  Prealbumin less than 5  Dietary consult appreciated  Compounded by chronic poly-illicit drug abuse  *Chronic poly-illicit drug abuse Stable Cessation counseling given while in house  Disposition Home in 1 to 2 days barring any complications   All the records are reviewed and case discussed with Care Management/Social Workerr. Management  plans discussed with the patient, family and they are in agreement.  CODE STATUS: full  TOTAL TIME TAKING CARE OF THIS PATIENT: 35 minutes.     POSSIBLE D/C IN 1-2 DAYS, DEPENDING ON CLINICAL CONDITION.   Evelena Asa Loucile Posner M.D on 03/28/2018   Between 7am to 6pm - Pager - [phone]  After 6pm go to [email or web address] - password [email or web address]  [facility] [place] [facility]  Office  [phone]  CC: Primary care physician; Patient, No Pcp Per  Note: This dictation was prepared with Dragon dictation along with smaller phrase technology. Any transcriptional errors that result from this process are unintentional.

## 2018-03-28 NOTE — Progress Notes (Signed)
Initial Nutrition Assessment  DOCUMENTATION CODES:   Severe malnutrition in context of chronic illness, Underweight  INTERVENTION:  Provide Ensure Enlive po TID, each supplement provides 350 kcal and 20 grams of protein. Patient prefers chocolate.  Provide daily MVI.  Encouraged ongoing adequate intake of calories and protein from meals, snacks, and oral nutrition supplements. Discussed increased calorie/protein needs to be able to gain weight considering how cachetic patient is. Reviewed foods that are high in calories and protein.  Monitor magnesium, potassium, and phosphorus daily for at least 3 days, MD to replete as needed, as pt is at risk for refeeding syndrome given severe malnutrition.  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  GOAL:   Patient will meet greater than or equal to 90% of their needs, Weight gain  MONITOR:   PO intake, Supplement acceptance, Labs, Weight trends, I & O's  REASON FOR ASSESSMENT:   Malnutrition Screening Tool, Consult Assessment of nutrition requirement/status  ASSESSMENT:   62 year old male with PMHx of RA, HLD, hx MI, hx poly-illicit drug abuse now admitted with acute C. Difficile colitis, acute on chronic pain syndrome, acute bronchitis, AECOPD.   Met with patient and his son at bedside. He reports that PTA he had a poor appetite for about one month. He reports he was having issues with diarrhea, abdominal pain, and anorexia. He reports that for about one month PTA he was barely eating any food at all and was only taking bites/sips. Patient did not mention any drug use to RD but per chart he has a hx of chronic poly-illicit drug abuse which is also likely contributing to inadequate intake. He reports his pain is getting better and his stool is thickening up now. He is eating well at meals. He reports he is finishing  all of his meals and also having snacks such as peanut butter crackers, ice cream, and milk between meals.   Patient reports that his UBW was 140-145 lbs approximately one year ago when he had his MI and that he was 130 lbs one month ago. This does not align with weight history in chart. Per chart he was already down to 92.1 lbs on 02/11/2016 so this weight loss has likely been chronic.  Patient wants to gain back to at least 130 lbs.  Medications reviewed and include: acidophilus 1 capsule TID, Cardizem, prednisone 50 mg daily, vancomycin, NS @ 30 mL/hr.  Labs reviewed: Sodium 134, Potassium 3.3, Chloride 97, BUN 22.  Discussed with RN. Patient is eating well at meals and asking for snacks between meals. Also discussed with MD. Faythe Ghee to monitor electrolytes.  NUTRITION - FOCUSED PHYSICAL EXAM:    Most Recent Value  Orbital Region  Severe depletion  Upper Arm Region  Severe depletion  Thoracic and Lumbar Region  Severe depletion  Buccal Region  Severe depletion  Temple Region  Severe depletion  Clavicle Bone Region  Severe depletion  Clavicle and Acromion Bone Region  Severe depletion  Scapular Bone Region  Severe depletion  Dorsal Hand  Severe depletion  Patellar Region  Severe depletion  Anterior Thigh Region  Severe depletion  Posterior Calf Region  Severe depletion  Edema (RD Assessment)  None  Hair  Reviewed  Eyes  Unable to assess  Mouth  Unable to assess  Skin  Reviewed  Nails  Reviewed     Diet Order:  Diet regular Room service appropriate?  Yes; Fluid consistency: Thin  EDUCATION NEEDS:   Education needs have been addressed  Skin:  Skin Assessment: Reviewed RN Assessment  Last BM:  03/27/2018 (BM characteristics not documented)  Height:   Ht Readings from Last 1 Encounters:  03/26/18 '5\' 6"'  (1.676 m)    Weight:   Wt Readings from Last 1 Encounters:  03/26/18 84 lb 11.2 oz (38.4 kg)    Ideal Body Weight:  64.5 kg  BMI:  Body mass index is 13.67  kg/m.  Estimated Nutritional Needs:   Kcal:  1540-1730 (40-45 kcal/kg for wt gain)  Protein:  >77 grams protein (2 grams/kg)  Fluid:  1.5-1.7 L/day (1 mL/kcal)  Willey Blade, MS, RD, LDN Office: [phone] Pager: [phone] After Hours/Weekend Pager: [phone]

## 2018-03-28 NOTE — Progress Notes (Signed)
MEDICATION RELATED CONSULT NOTE - INITIAL   Pharmacy Consult for electrolyte replacement Indication: hypokalemia and risk for refeeding  Allergies  Allergen Reactions  . Norco [Hydrocodone-Acetaminophen] Itching and Rash    Patient Measurements: Height: 5\' 6"  (167.6 cm) Weight: 84 lb 11.2 oz (38.4 kg) IBW/kg (Calculated) : 63.8 Adjusted Body Weight:   Vital Signs: Temp: 98.3 F (36.8 C) (04/28 2134) Temp Source: Oral (04/28 2134) BP: 151/93 (04/28 2134) Pulse Rate: 115 (04/28 2134) Intake/Output from previous day: 04/27 0701 - 04/28 0700 In: 0  Out: 502 [Urine:501; Stool:1] Intake/Output from this shift: No intake/output data recorded.  Labs: Recent Labs    03/26/18 1701 03/26/18 2326 03/28/18 1031 03/28/18 2200  WBC 8.3 3.2*  --   --   HGB 13.7 11.8*  --   --   HCT 41.0 35.9*  --   --   PLT 176 141*  --   --   CREATININE 0.70 0.64 0.68 0.55*  MG  --   --  1.2* 1.9  PHOS  --   --   --  2.2*  ALBUMIN 3.1*  --   --   --   PROT 8.1  --   --   --   AST 41  --   --   --   ALT 31  --   --   --   ALKPHOS 165*  --   --   --   BILITOT 0.9  --   --   --    Estimated Creatinine Clearance: 51.3 mL/min (A) (by C-G formula based on SCr of 0.55 mg/dL (L)).   Microbiology: Recent Results (from the past 720 hour(s))  Blood Culture (routine x 2)     Status: None (Preliminary result)   Collection Time: 03/26/18  5:24 PM  Result Value Ref Range Status   Specimen Description BLOOD RT [facility]  Final   Special Requests   Final    BOTTLES DRAWN AEROBIC AND ANAEROBIC Blood Culture results may not be optimal due to an excessive volume of blood received in culture bottles   Culture   Final    NO GROWTH 2 DAYS Performed at [facility], [address] [place], [place]    Report Status PENDING  Incomplete  Urine culture     Status: None   Collection Time: 03/26/18  5:32 PM  Result Value Ref Range Status   Specimen Description   Final    URINE,  RANDOM Performed at [facility], [address] [place], [place]    Special Requests   Final    NONE Performed at [facility], [address] [place], [place]    Culture   Final    NO GROWTH Performed at [facility], [address], [place], [place]    Report Status 03/28/2018 FINAL  Final  Blood Culture (routine x 2)     Status: None (Preliminary result)   Collection Time: 03/26/18  5:37 PM  Result Value Ref Range Status   Specimen Description BLOOD LT FA  Final   Special Requests   Final    BOTTLES DRAWN AEROBIC AND ANAEROBIC Blood Culture adequate volume   Culture   Final    NO GROWTH 2 DAYS Performed at [facility], [address], [place], [place]    Report Status PENDING  Incomplete  C difficile quick scan w PCR reflex     Status: Abnormal  Collection Time: 03/27/18  1:33 AM  Result Value Ref Range Status   C Diff antigen POSITIVE (A) NEGATIVE Final   C Diff toxin NEGATIVE NEGATIVE Final   C Diff interpretation Results are indeterminate. See PCR results.  Final    Comment: Performed at [facility], [address] [place], [place] [address]  C. Diff by PCR, Reflexed     Status: Abnormal   Collection Time: 03/27/18  1:33 AM  Result Value Ref Range Status   Toxigenic C. Difficile by PCR POSITIVE (A) NEGATIVE Final    Comment: Positive for toxigenic C. difficile with little to no toxin production. Only treat if clinical presentation suggests symptomatic illness. Performed at [facility], [address] [place], [place] [address]     Medical History: Past Medical History:  Diagnosis Date  . Collagen vascular disease (HCC)   . Hyperlipidemia   . Myocardial infarct (HCC)   . Rheumatoid arthritis(714.0)     Medications:  Infusions:  . sodium chloride 30 mL/hr at 03/28/18 1833  . potassium PHOSPHATE IVPB (mmol)      Assessment: 62 yom cc aching and emesis,  diarrhea x 7 to 10 days. CDI PCR positive now on oral vancomycin. K noted to be low on 4/26 but no replacement noted. Dietitian is starting Ensure Enlive and consults pharmacy to manage electrolytes due to refeeding risk.  Goal of Therapy:  K 3.5 to 5 Ca 8.9 to 10.3 Mg 1.7 to 2.4 Phos 2.5 to 4.6  Plan:  Will check electrolytes today then daily x 3 days per RD recommendation. Pharmacy will monitor and supplement per protocol.  04/28 @ 2200 phos 2.2, K 3.3. Will replace w/ Kphos 30 mmol IV x 1 over 6 hours and f/u w/ am labs.  Thomasene Ripple, Pharm.D., BCPS Clinical Pharmacist 03/28/2018,11:27 PM

## 2018-03-28 NOTE — Plan of Care (Signed)
Pt has had a good appetite today

## 2018-03-29 LAB — BASIC METABOLIC PANEL
Anion gap: 8 (ref 5–15)
BUN: 27 mg/dL — AB (ref 6–20)
CO2: 31 mmol/L (ref 22–32)
CREATININE: 0.34 mg/dL — AB (ref 0.61–1.24)
Calcium: 8.8 mg/dL — ABNORMAL LOW (ref 8.9–10.3)
Chloride: 101 mmol/L (ref 101–111)
GFR calc Af Amer: >60 mL/min (ref >60)
GFR calc non Af Amer: >60 mL/min (ref >60)
Glucose, Bld: 121 mg/dL — ABNORMAL HIGH (ref 65–99)
POTASSIUM: 3.8 mmol/L (ref 3.5–5.1)
Sodium: 140 mmol/L (ref 135–145)

## 2018-03-29 LAB — ANA COMPREHENSIVE PANEL
Anti JO-1: <0.2 AI (ref 0.0–0.9)
Centromere Ab Screen: <0.2 AI (ref 0.0–0.9)
ENA SM Ab Ser-aCnc: 0.2 AI (ref 0.0–0.9)
Ribonucleic Protein: <0.2 AI (ref 0.0–0.9)
Scleroderma (Scl-70) (ENA) Antibody, IgG: <0.2 AI (ref 0.0–0.9)
ds DNA Ab: <1 IU/mL (ref 0–9)

## 2018-03-29 LAB — MAGNESIUM: Magnesium: 1.8 mg/dL (ref 1.7–2.4)

## 2018-03-29 LAB — PHOSPHORUS: Phosphorus: 3 mg/dL (ref 2.5–4.6)

## 2018-03-29 MED ORDER — GUAIFENESIN-DM 100-10 MG/5ML PO SYRP
5.0000 mL | ORAL_SOLUTION | ORAL | Status: DC | PRN
  Administered 2018-03-29: 5 mL via ORAL
  Filled 2018-03-29 (×2): qty 5

## 2018-03-29 MED ORDER — METHIMAZOLE 10 MG PO TABS
30.0000 mg | ORAL_TABLET | Freq: Every day | ORAL | Status: DC
  Administered 2018-03-30: 30 mg via ORAL
  Filled 2018-03-29: qty 3

## 2018-03-29 MED ORDER — METHIMAZOLE 10 MG PO TABS
20.0000 mg | ORAL_TABLET | Freq: Once | ORAL | Status: AC
  Administered 2018-03-29: 20 mg via ORAL
  Filled 2018-03-29: qty 2

## 2018-03-29 NOTE — Progress Notes (Signed)
MEDICATION RELATED CONSULT NOTE - INITIAL   Pharmacy Consult for electrolyte replacement Indication: hypokalemia and risk for refeeding  Allergies  Allergen Reactions  . Norco [Hydrocodone-Acetaminophen] Itching and Rash    Patient Measurements: Height: 5\' 6"  (167.6 cm) Weight: 84 lb 11.2 oz (38.4 kg) IBW/kg (Calculated) : 63.8 Adjusted Body Weight:   Vital Signs: Temp: 98.3 F (36.8 C) (04/29 0558) Temp Source: Oral (04/29 0558) BP: 145/88 (04/29 0558) Pulse Rate: 99 (04/29 0558) Intake/Output from previous day: 04/28 0701 - 04/29 0700 In: 730 [P.O.:240; I.V.:390; IV Piggyback:100] Out: 0  Intake/Output from this shift: Total I/O In: -  Out: 150 [Urine:150]  Labs: Recent Labs    03/26/18 1701 03/26/18 2326 03/28/18 1031 03/28/18 2200 03/29/18 0413  WBC 8.3 3.2*  --   --   --   HGB 13.7 11.8*  --   --   --   HCT 41.0 35.9*  --   --   --   PLT 176 141*  --   --   --   CREATININE 0.70 0.64 0.68 0.55* 0.34*  MG  --   --  1.2* 1.9 1.8  PHOS  --   --   --  2.2* 3.0  ALBUMIN 3.1*  --   --   --   --   PROT 8.1  --   --   --   --   AST 41  --   --   --   --   ALT 31  --   --   --   --   ALKPHOS 165*  --   --   --   --   BILITOT 0.9  --   --   --   --    Estimated Creatinine Clearance: 51.3 mL/min (A) (by C-G formula based on SCr of 0.34 mg/dL (L)).   Microbiology: Recent Results (from the past 720 hour(s))  Blood Culture (routine x 2)     Status: None (Preliminary result)   Collection Time: 03/26/18  5:24 PM  Result Value Ref Range Status   Specimen Description BLOOD RT [facility]  Final   Special Requests   Final    BOTTLES DRAWN AEROBIC AND ANAEROBIC Blood Culture results may not be optimal due to an excessive volume of blood received in culture bottles   Culture   Final    NO GROWTH 2 DAYS Performed at [facility], [address], [place], [place]    Report Status PENDING  Incomplete  Urine culture     Status: None   Collection Time:  03/26/18  5:32 PM  Result Value Ref Range Status   Specimen Description   Final    URINE, RANDOM Performed at [facility], [address] [place], [place]    Special Requests   Final    NONE Performed at [facility], [address], [place], [place]    Culture   Final    NO GROWTH Performed at [facility], [address], [place], [place]    Report Status 03/28/2018 FINAL  Final  Blood Culture (routine x 2)     Status: None (Preliminary result)   Collection Time: 03/26/18  5:37 PM  Result Value Ref Range Status   Specimen Description BLOOD LT FA  Final   Special Requests   Final    BOTTLES DRAWN AEROBIC AND ANAEROBIC Blood Culture adequate volume   Culture   Final  NO GROWTH 2 DAYS Performed at [facility], [address] [place], [place] [address]    Report Status PENDING  Incomplete  C difficile quick scan w PCR reflex     Status: Abnormal   Collection Time: 03/27/18  1:33 AM  Result Value Ref Range Status   C Diff antigen POSITIVE (A) NEGATIVE Final   C Diff toxin NEGATIVE NEGATIVE Final   C Diff interpretation Results are indeterminate. See PCR results.  Final    Comment: Performed at [facility], [address] [place], [place] [address]  C. Diff by PCR, Reflexed     Status: Abnormal   Collection Time: 03/27/18  1:33 AM  Result Value Ref Range Status   Toxigenic C. Difficile by PCR POSITIVE (A) NEGATIVE Final    Comment: Positive for toxigenic C. difficile with little to no toxin production. Only treat if clinical presentation suggests symptomatic illness. Performed at [facility], [address], [place], [place] [address]     Medical History: Past Medical History:  Diagnosis Date  . Collagen vascular disease (HCC)   . Hyperlipidemia   . Myocardial infarct (HCC)   . Rheumatoid arthritis(714.0)     Medications:  Infusions:  . sodium chloride 30 mL/hr at 03/28/18  1833  . potassium PHOSPHATE IVPB (mmol)      Assessment: 62 yom cc aching and emesis, diarrhea x 7 to 10 days. CDI PCR positive now on oral vancomycin. K noted to be low on 4/26 but no replacement noted. Dietitian is starting Ensure Enlive and consults pharmacy to manage electrolytes due to refeeding risk.  K=3.8 Mg=1.8 Phos=3  Goal of Therapy:  K 3.5 to 5 Ca 8.9 to 10.3 Mg 1.7 to 2.4 Phos 2.5 to 4.6  Plan:  Electrolytes are WNL. No supplementation needed. Due to refeeding risk, will check Mg, phos, K x 2 more days  Olene Floss, Pharm.D., BCPS Clinical Pharmacist 03/29/2018,7:29 AM

## 2018-03-29 NOTE — Progress Notes (Signed)
[employer] [facility] [place] [facility]   PATIENT NAME: Paul Day    MR#:  [id number]  DATE OF BIRTH:  [birth year]  SUBJECTIVE:  CHIEF COMPLAINT:   Chief Complaint  Patient presents with  . aching all over  . Emesis  stool getting formed, still had 8 BM when I saw him REVIEW OF SYSTEMS:  CONSTITUTIONAL: No fever, fatigue or weakness.  EYES: No blurred or double vision.  EARS, NOSE, AND THROAT: No tinnitus or ear pain.  RESPIRATORY: No cough, shortness of breath, wheezing or hemoptysis.  CARDIOVASCULAR: No chest pain, orthopnea, edema.  GASTROINTESTINAL: No nausea, vomiting, diarrhea or abdominal pain.  GENITOURINARY: No dysuria, hematuria.  ENDOCRINE: No polyuria, nocturia,  HEMATOLOGY: No anemia, easy bruising or bleeding SKIN: No rash or lesion. MUSCULOSKELETAL: No joint pain or arthritis.   NEUROLOGIC: No tingling, numbness, weakness.  PSYCHIATRY: No anxiety or depression.   ROS  DRUG ALLERGIES:   Allergies  Allergen Reactions  . Norco [Hydrocodone-Acetaminophen] Itching and Rash    VITALS:  Blood pressure (!) 143/85, pulse 98, temperature 98.1 F (36.7 C), temperature source Oral, resp. rate 19, height 5\' 6"  (1.676 m), weight 38.4 kg (84 lb 11.2 oz), SpO2 95 %.  PHYSICAL EXAMINATION:  GENERAL:  62 y.o.-year-old patient lying in the bed with no acute distress. Cachetic looking EYES: Pupils equal, round, reactive to light and accommodation. No scleral icterus. Extraocular muscles intact.  HEENT: Head atraumatic, normocephalic. Oropharynx and nasopharynx clear.  NECK:  Supple, no jugular venous distention. No thyroid enlargement, no tenderness.  LUNGS: Normal breath sounds bilaterally, no wheezing, rales,rhonchi or crepitation. No use of accessory muscles of respiration.  CARDIOVASCULAR: S1, S2 normal. No murmurs, rubs, or gallops.  ABDOMEN: Soft, nontender, nondistended. Bowel sounds present. No organomegaly or mass.  EXTREMITIES: No pedal edema,  cyanosis, or clubbing.  NEUROLOGIC: Cranial nerves II through XII are intact. Muscle strength 5/5 in all extremities. Sensation intact. Gait not checked.  PSYCHIATRIC: The patient is alert and oriented x 3.  SKIN: No obvious rash, lesion, or ulcer.   Physical Exam LABORATORY PANEL:   CBC Recent Labs  Lab 03/26/18 2326  WBC 3.2*  HGB 11.8*  HCT 35.9*  PLT 141*   ------------------------------------------------------------------------------------------------------------------  Chemistries  Recent Labs  Lab 03/26/18 1701  03/29/18 0413  NA 134*   < > 140  K 3.9   < > 3.8  CL 93*   < > 101  CO2 30   < > 31  GLUCOSE 99   < > 121*  BUN 24*   < > 27*  CREATININE 0.70   < > 0.34*  CALCIUM 9.1   < > 8.8*  MG  --    < > 1.8  AST 41  --   --   ALT 31  --   --   ALKPHOS 165*  --   --   BILITOT 0.9  --   --    < > = values in this interval not displayed.   ------------------------------------------------------------------------------------------------------------------  Cardiac Enzymes Recent Labs  Lab 03/26/18 1704  TROPONINI 0.03*   ------------------------------------------------------------------------------------------------------------------  RADIOLOGY:  No results found.  ASSESSMENT AND PLAN:  Patient is a 62 year old with history of rheumatoid arthritis nicotine abuse presenting with multiple complaints  *Acute C. difficile colitis  - improving Continue p.o. Flagyl for (day 4 of 14-day course), Lactinex 3 times daily, strict I&O monitoring   *Acute on chronic pain syndrome  Much improved Noted history of poly-illicit drug abuse  Continue Neurontin, Elavil, Flexeril   *Acute bronchitis  Resolving  Continue Mucinex, prednisone taper   *Chronic tobacco smoking abuse/dependency  Stable Nicotine patch with cessation counseling ordered   *Acute on COPD exacerbation  Resolved  Steroids per above, breathing treatments as needed   *Chronic  Rheumatoid arthritis Improved Sed rate 78 Will need to follow-up with rheumatology status post discharge, currently on steroids  *Acute severe protein calorie/energy malnutrition  Prealbumin less than 5  Dietary consult appreciated  Compounded by chronic poly-illicit drug abuse  *Chronic poly-illicit drug abuse Stable Cessation counseling given while in house  Disposition Home in 1  days barring any complications   All the records are reviewed and case discussed with Care Management/Social Workerr. Management plans discussed with the patient, nursing and they are in agreement.  CODE STATUS: full  TOTAL TIME TAKING CARE OF THIS PATIENT: 35 minutes.     POSSIBLE D/C IN 1 DAYS, DEPENDING ON CLINICAL CONDITION.   Delfino Lovett M.D on 03/29/2018   Between 7am to 6pm - Pager - [phone]  After 6pm go to [email or web address] - password [email or web address]  [facility] [place]  Office  [phone]  CC: Primary care physician; Patient, No Pcp Per  Note: This dictation was prepared with Dragon dictation along with smaller phrase technology. Any transcriptional errors that result from this process are unintentional.

## 2018-03-30 LAB — MAGNESIUM: Magnesium: 1.5 mg/dL — ABNORMAL LOW (ref 1.7–2.4)

## 2018-03-30 LAB — PHOSPHORUS: Phosphorus: 2.7 mg/dL (ref 2.5–4.6)

## 2018-03-30 LAB — CBC
HEMATOCRIT: 39.3 % — AB (ref 40.0–52.0)
Hemoglobin: 13 g/dL (ref 13.0–18.0)
MCH: 25.7 pg — AB (ref 26.0–34.0)
MCHC: 32.9 g/dL (ref 32.0–36.0)
MCV: 78 fL — AB (ref 80.0–100.0)
Platelets: 246 10*3/uL (ref 150–440)
RBC: 5.04 MIL/uL (ref 4.40–5.90)
RDW: 14.6 % — ABNORMAL HIGH (ref 11.5–14.5)
WBC: 9.3 10*3/uL (ref 3.8–10.6)

## 2018-03-30 LAB — BASIC METABOLIC PANEL
ANION GAP: 7 (ref 5–15)
BUN: 29 mg/dL — ABNORMAL HIGH (ref 6–20)
CHLORIDE: 99 mmol/L — AB (ref 101–111)
CO2: 29 mmol/L (ref 22–32)
Calcium: 9.1 mg/dL (ref 8.9–10.3)
Creatinine, Ser: 0.55 mg/dL — ABNORMAL LOW (ref 0.61–1.24)
GFR calc non Af Amer: >60 mL/min (ref >60)
Glucose, Bld: 104 mg/dL — ABNORMAL HIGH (ref 65–99)
POTASSIUM: 4.4 mmol/L (ref 3.5–5.1)
Sodium: 135 mmol/L (ref 135–145)

## 2018-03-30 MED ORDER — DILTIAZEM HCL 60 MG PO TABS: 60.0000 mg | ORAL_TABLET | Freq: Three times a day (TID) | ORAL | 0 refills | Status: DC

## 2018-03-30 MED ORDER — GABAPENTIN 100 MG PO CAPS: 200.0000 mg | ORAL_CAPSULE | Freq: Three times a day (TID) | ORAL | 0 refills | Status: DC

## 2018-03-30 MED ORDER — METHIMAZOLE 10 MG PO TABS: 30.0000 mg | ORAL_TABLET | Freq: Every day | ORAL | 0 refills | Status: DC

## 2018-03-30 MED ORDER — VANCOMYCIN 50 MG/ML ORAL SOLUTION: 125.0000 mg | Freq: Four times a day (QID) | ORAL | 0 refills | Status: AC

## 2018-03-30 MED ORDER — ENSURE ENLIVE PO LIQD: 237.0000 mL | Freq: Three times a day (TID) | ORAL | 12 refills | Status: DC

## 2018-03-30 MED ORDER — AMITRIPTYLINE HCL 50 MG PO TABS: 50.0000 mg | ORAL_TABLET | Freq: Every day | ORAL | 0 refills | Status: DC

## 2018-03-30 MED ORDER — ALBUTEROL SULFATE HFA 108 (90 BASE) MCG/ACT IN AERS: 2.0000 | INHALATION_SPRAY | Freq: Four times a day (QID) | RESPIRATORY_TRACT | 2 refills | Status: DC | PRN

## 2018-03-30 MED ORDER — RISAQUAD PO CAPS: 1.0000 | ORAL_CAPSULE | Freq: Three times a day (TID) | ORAL | 0 refills | Status: DC

## 2018-03-30 MED ORDER — MAGNESIUM SULFATE 2 GM/50ML IV SOLN
2.0000 g | Freq: Once | INTRAVENOUS | Status: AC
  Administered 2018-03-30: 2 g via INTRAVENOUS
  Filled 2018-03-30: qty 50

## 2018-03-30 NOTE — Progress Notes (Signed)
MEDICATION RELATED CONSULT NOTE - INITIAL   Pharmacy Consult for electrolyte replacement Indication: hypokalemia and risk for refeeding  Allergies  Allergen Reactions  . Norco [Hydrocodone-Acetaminophen] Itching and Rash    Patient Measurements: Height: 5\' 6"  (167.6 cm) Weight: 84 lb 11.2 oz (38.4 kg) IBW/kg (Calculated) : 63.8 Adjusted Body Weight:   Vital Signs: Temp: 97.6 F (36.4 C) (04/30 0440) Temp Source: Oral (04/30 0440) BP: 150/95 (04/30 0440) Pulse Rate: 97 (04/30 0440) Intake/Output from previous day: 04/29 0701 - 04/30 0700 In: 633 [I.V.:633] Out: 450 [Urine:450] Intake/Output from this shift: No intake/output data recorded.  Labs: Recent Labs    03/28/18 1031 03/28/18 2200 03/29/18 0413  CREATININE 0.68 0.55* 0.34*  MG 1.2* 1.9 1.8  PHOS  --  2.2* 3.0   Estimated Creatinine Clearance: 51.3 mL/min (A) (by C-G formula based on SCr of 0.34 mg/dL (L)).   Microbiology: Recent Results (from the past 720 hour(s))  Blood Culture (routine x 2)     Status: None (Preliminary result)   Collection Time: 03/26/18  5:24 PM  Result Value Ref Range Status   Specimen Description BLOOD RT [facility]  Final   Special Requests   Final    BOTTLES DRAWN AEROBIC AND ANAEROBIC Blood Culture results may not be optimal due to an excessive volume of blood received in culture bottles   Culture   Final    NO GROWTH 2 DAYS Performed at [facility], [address] [place], [place]    Report Status PENDING  Incomplete  Urine culture     Status: None   Collection Time: 03/26/18  5:32 PM  Result Value Ref Range Status   Specimen Description   Final    URINE, RANDOM Performed at [facility], [address], [place], [place]    Special Requests   Final    NONE Performed at [facility], [address], [place], [place]    Culture   Final    NO GROWTH Performed at [facility], [address], [place],  [place]    Report Status 03/28/2018 FINAL  Final  Blood Culture (routine x 2)     Status: None (Preliminary result)   Collection Time: 03/26/18  5:37 PM  Result Value Ref Range Status   Specimen Description BLOOD LT FA  Final   Special Requests   Final    BOTTLES DRAWN AEROBIC AND ANAEROBIC Blood Culture adequate volume   Culture   Final    NO GROWTH 2 DAYS Performed at [facility], [address] [place] [address], [place], [place]    Report Status PENDING  Incomplete  C difficile quick scan w PCR reflex     Status: Abnormal   Collection Time: 03/27/18  1:33 AM  Result Value Ref Range Status   C Diff antigen POSITIVE (A) NEGATIVE Final   C Diff toxin NEGATIVE NEGATIVE Final   C Diff interpretation Results are indeterminate. See PCR results.  Final    Comment: Performed at [facility], [address] [place], [place]  C. Diff by PCR, Reflexed     Status: Abnormal   Collection Time: 03/27/18  1:33 AM  Result Value Ref Range Status   Toxigenic C. Difficile by PCR POSITIVE (A) NEGATIVE Final    Comment: Positive for toxigenic C. difficile with little to no toxin production. Only treat if clinical presentation suggests symptomatic illness. Performed at [facility], [address], [place], [place] [address]     Medical History: Past Medical History:  Diagnosis Date  . Collagen vascular disease (HCC)   . Hyperlipidemia   . Myocardial infarct (HCC)   . Rheumatoid arthritis(714.0)     Medications:  Infusions:  . sodium chloride 30 mL/hr at 03/28/18 1833    Assessment: 62 yom cc aching and emesis, diarrhea x 7 to 10 days. CDI PCR positive now on oral vancomycin. K noted to be low on 4/26 but no replacement noted. Dietitian is starting Ensure Enlive and consults pharmacy to manage electrolytes due to refeeding risk.  K=4.4 Mg=1.8 Phos=2.7  Goal of Therapy:  K 3.5 to 5 Ca 8.9 to 10.3 Mg 1.7 to 2.4 Phos 2.5 to 4.6  Plan:  Mg=1.5 all  other electrolytes are WNL. Mg 2g IV once. Due to refeeding risk, will check Mg, phos, K x 1 more day  Dawnisha Marquina D Nisa Decaire, Pharm.D., BCPS Clinical Pharmacist 03/30/2018,7:14 AM

## 2018-03-30 NOTE — Progress Notes (Signed)
Pt discharged per MD order. IV removed. Medications given to pt. Pt understood that he would have to acquire is ensure and probiotic over the counter. Questions answered to pt satisfaction. Pt declined wheelchair and walked to catch the bus.

## 2018-03-30 NOTE — Discharge Instructions (Signed)
Clostridium Difficile Infection   Clostridium difficile (C. difficile or C. diff) infection causes inflammation of the large intestine (colon). This condition can result in damage to the lining of your colon and may lead to another condition called colitis. This infection can be passed from person to person (is contagious).  Follow these instructions at home:  Eating and drinking   · Drink enough fluid to keep your pee (urine) clear or pale yellow.  · Avoid drinking:  ? Milk.  ? Caffeine.  ? Alcohol.  · Follow exact instructions from your doctor about how to get enough fluid in your body (rehydrate).  · Eat small meals often instead of large meals.  Medicines   · Take your antibiotic medicine as told by your doctor. Do not stop taking the antibiotic even if you start to feel better unless your doctor told you to do that.  · Take over-the-counter and prescription medicines only as told by your doctor.  · Do not use medicines to help with watery poop (diarrhea).  General instructions   · Wash your hands fully before you prepare food and after you use the bathroom. Make sure people who live with you also wash their  · hands often.  · Clean the surfaces that you touch. Use a product that contains chlorine bleach.  · Keep all follow-up visits as told by your doctor. This is important.  Contact a doctor if:  · Your symptoms do not get better with treatment.  · Your symptoms get worse with treatment.  · Your symptoms go away and then come back.  · You have a fever.  · You have new symptoms.  Get help right away if:  · You have more pain or tenderness in your belly (abdomen).  · Your poop (stool) is mostly bloody.  · Your poop looks dark black and tarry.  · You cannot eat or drink without throwing up (vomiting).  · You have signs of dehydration, such as:  ? Dark pee, very little pee, or no pee.  ? Cracked lips.  ? Not making tears when you cry.  ? Dry mouth.  ? Sunken eyes.  ? Feeling sleepy.  ? Feeling weak.  ? Feeling  dizzy.  This information is not intended to replace advice given to you by your health care provider. Make sure you discuss any questions you have with your health care provider.  Document Released: 09/14/2009 Document Revised: 04/24/2016 Document Reviewed: 05/21/2015  Elsevier Interactive Patient Education © 2017 Elsevier Inc.

## 2018-03-30 NOTE — Care Management (Signed)
Patient discharge home today.  Patient states he is using [employer] to get home.  Patient does not have PCP.  States "ive never needed one".  Patient provided with application to [facility].  Scripts were filled at [facility] .  Oral vanc was filled at [facility] outpatient pharmacy.  Medications were picked up by volunteer services, and delivered to patient by bedside RN.  Patient will have to get probiotics and ensure over the count.  Patient provided application to [facility] .  Patient states that transportation is an issue, due to having to rely on the bus system.

## 2018-03-31 LAB — CULTURE, BLOOD (ROUTINE X 2)
CULTURE: NO GROWTH
CULTURE: NO GROWTH
SPECIAL REQUESTS: ADEQUATE

## 2018-04-02 NOTE — Discharge Summary (Signed)
[employer] [facility] [place] at [facility]   PATIENT NAME: Paul Day    MR#:  [id number]  DATE OF BIRTH:  10/03/1956  DATE OF ADMISSION:  03/26/2018   ADMITTING PHYSICIAN: Auburn Bilberry, MD  DATE OF DISCHARGE: 03/30/2018  4:59 PM  PRIMARY CARE PHYSICIAN: Patient, No Pcp Per   ADMISSION DIAGNOSIS:  Dehydration [E86.0] Enteritis [K52.9] Tachycardia [R00.0] DISCHARGE DIAGNOSIS:  Active Problems:   Fever   Protein-calorie malnutrition, severe  SECONDARY DIAGNOSIS:   Past Medical History:  Diagnosis Date  . Collagen vascular disease (HCC)   . Hyperlipidemia   . Myocardial infarct (HCC)   . Rheumatoid arthritis(714.0)    HOSPITAL COURSE:  Patient is a 62 year old with history of rheumatoid arthritis nicotine abuse presenting with multiple complaints  *Acute C. difficile colitis  - improving Continue p.o. Flagyl for total 14-day course, Lactinex 3 times daily  *Acute on chronic pain syndrome  Much improved Noted history of poly-illicit drug abuse  Continue Neurontin, Elavil, Flexeril   *Acute bronchitis  Resolving  Continue Mucinex, prednisone taper  *Chronic tobacco smoking abuse/dependency  Stable Nicotine patch with cessation counseling ordered  *Acute on COPD exacerbation  Resolved  Steroids per above, breathing treatments as needed  *ChronicRheumatoid arthritis Improved Sed rate 78 Will need to follow-up with rheumatology status post discharge, currently on steroids  *Acute severe protein calorie/energy malnutrition  Prealbumin less than 5  Dietary consult appreciated  Compounded by chronic poly-illicit drug abuse  *Chronic poly-illicit drug abuse Stable Cessation counseling given while in house DISCHARGE CONDITIONS:  stable CONSULTS OBTAINED:   DRUG ALLERGIES:   Allergies  Allergen Reactions  . Norco [Hydrocodone-Acetaminophen] Itching and Rash   DISCHARGE MEDICATIONS:   Allergies as of 03/30/2018    Reactions   Norco [hydrocodone-acetaminophen] Itching, Rash      Medication List    TAKE these medications   acidophilus Caps capsule Take 1 capsule by mouth 3 (three) times daily with meals.   albuterol 108 (90 Base) MCG/ACT inhaler Commonly known as:  PROVENTIL HFA;VENTOLIN HFA Inhale 2 puffs into the lungs every 6 (six) hours as needed for wheezing or shortness of breath.   amitriptyline 50 MG tablet Commonly known as:  ELAVIL Take 1 tablet (50 mg total) by mouth at bedtime.   diltiazem 60 MG tablet Commonly known as:  CARDIZEM Take 1 tablet (60 mg total) by mouth every 8 (eight) hours.   feeding supplement (ENSURE ENLIVE) Liqd Take 237 mLs by mouth 3 (three) times daily between meals.   gabapentin 100 MG capsule Commonly known as:  NEURONTIN Take 2 capsules (200 mg total) by mouth 3 (three) times daily.   methimazole 10 MG tablet Commonly known as:  TAPAZOLE Take 3 tablets (30 mg total) by mouth daily.   vancomycin 50 mg/mL oral solution Commonly known as:  VANCOCIN Take 2.5 mLs (125 mg total) by mouth 4 (four) times daily for 10 days.      DISCHARGE INSTRUCTIONS:   DIET:  Regular diet DISCHARGE CONDITION:  Good ACTIVITY:  Activity as tolerated OXYGEN:  Home Oxygen: No.  Oxygen Delivery: room air DISCHARGE LOCATION:  home   If you experience worsening of your admission symptoms, develop shortness of breath, life threatening emergency, suicidal or homicidal thoughts you must seek medical attention immediately by calling 911 or calling your MD immediately  if symptoms less severe.  You Must read complete instructions/literature along with all the possible adverse reactions/side effects for all the Medicines you take and that have  been prescribed to you. Take any new Medicines after you have completely understood and accpet all the possible adverse reactions/side effects.   Please note  You were cared for by a hospitalist during your hospital stay. If you  have any questions about your discharge medications or the care you received while you were in the hospital after you are discharged, you can call the unit and asked to speak with the hospitalist on call if the hospitalist that took care of you is not available. Once you are discharged, your primary care physician will handle any further medical issues. Please note that NO REFILLS for any discharge medications will be authorized once you are discharged, as it is imperative that you return to your primary care physician (or establish a relationship with a primary care physician if you do not have one) for your aftercare needs so that they can reassess your need for medications and monitor your lab values.    On the day of Discharge:  VITAL SIGNS:  Blood pressure 123/84, pulse (!) 111, temperature 97.8 F (36.6 C), temperature source Oral, resp. rate 19, height 5\' 6"  (1.676 m), weight 38.4 kg (84 lb 11.2 oz), SpO2 98 %. PHYSICAL EXAMINATION:  GENERAL:  62 y.o.-year-old patient lying in the bed with no acute distress.  EYES: Pupils equal, round, reactive to light and accommodation. No scleral icterus. Extraocular muscles intact.  HEENT: Head atraumatic, normocephalic. Oropharynx and nasopharynx clear.  NECK:  Supple, no jugular venous distention. No thyroid enlargement, no tenderness.  LUNGS: Normal breath sounds bilaterally, no wheezing, rales,rhonchi or crepitation. No use of accessory muscles of respiration.  CARDIOVASCULAR: S1, S2 normal. No murmurs, rubs, or gallops.  ABDOMEN: Soft, non-tender, non-distended. Bowel sounds present. No organomegaly or mass.  EXTREMITIES: No pedal edema, cyanosis, or clubbing.  NEUROLOGIC: Cranial nerves II through XII are intact. Muscle strength 5/5 in all extremities. Sensation intact. Gait not checked.  PSYCHIATRIC: The patient is alert and oriented x 3.  SKIN: No obvious rash, lesion, or ulcer.  DATA REVIEW:   CBC Recent Labs  Lab 03/30/18 0655  WBC 9.3    HGB 13.0  HCT 39.3*  PLT 246    Chemistries  Recent Labs  Lab 03/26/18 1701  03/30/18 0655  NA 134*   < > 135  K 3.9   < > 4.4  CL 93*   < > 99*  CO2 30   < > 29  GLUCOSE 99   < > 104*  BUN 24*   < > 29*  CREATININE 0.70   < > 0.55*  CALCIUM 9.1   < > 9.1  MG  --    < > 1.5*  AST 41  --   --   ALT 31  --   --   ALKPHOS 165*  --   --   BILITOT 0.9  --   --    < > = values in this interval not displayed.     Microbiology Results  Results for orders placed or performed during the hospital encounter of 03/26/18  Blood Culture (routine x 2)     Status: None   Collection Time: 03/26/18  5:24 PM  Result Value Ref Range Status   Specimen Description BLOOD RT [facility]  Final   Special Requests   Final    BOTTLES DRAWN AEROBIC AND ANAEROBIC Blood Culture results may not be optimal due to an excessive volume of blood received in culture bottles   Culture   Final  NO GROWTH 5 DAYS Performed at [facility], [address] [place], [place] [address]    Report Status 03/31/2018 FINAL  Final  Urine culture     Status: None   Collection Time: 03/26/18  5:32 PM  Result Value Ref Range Status   Specimen Description   Final    URINE, RANDOM Performed at [facility], [address] [place], [place] [address]    Special Requests   Final    NONE Performed at [facility], [address] [place], [place] [address]    Culture   Final    NO GROWTH Performed at [facility], [address], [place], [place] [address]    Report Status 03/28/2018 FINAL  Final  Blood Culture (routine x 2)     Status: None   Collection Time: 03/26/18  5:37 PM  Result Value Ref Range Status   Specimen Description BLOOD LT FA  Final   Special Requests   Final    BOTTLES DRAWN AEROBIC AND ANAEROBIC Blood Culture adequate volume   Culture   Final    NO GROWTH 5 DAYS Performed at [facility], [address], [place], [place] [address]    Report Status  03/31/2018 FINAL  Final  C difficile quick scan w PCR reflex     Status: Abnormal   Collection Time: 03/27/18  1:33 AM  Result Value Ref Range Status   C Diff antigen POSITIVE (A) NEGATIVE Final   C Diff toxin NEGATIVE NEGATIVE Final   C Diff interpretation Results are indeterminate. See PCR results.  Final    Comment: Performed at [facility], [address] [place], [place] [address]  C. Diff by PCR, Reflexed     Status: Abnormal   Collection Time: 03/27/18  1:33 AM  Result Value Ref Range Status   Toxigenic C. Difficile by PCR POSITIVE (A) NEGATIVE Final    Comment: Positive for toxigenic C. difficile with little to no toxin production. Only treat if clinical presentation suggests symptomatic illness. Performed at [facility], [address], [place], [place] [address]     RADIOLOGY:  No results found.   Management plans discussed with the patient, nursing and they are in agreement.  CODE STATUS: Prior   TOTAL TIME TAKING CARE OF THIS PATIENT: 45 minutes.    Delfino Lovett M.D on 04/02/2018 at 11:23 AM  Between 7am to 6pm - Pager - [phone]  After 6pm go to [email or web address] - Social research officer, government  [facility]  Office  [phone]  CC: Primary care physician; Patient, No Pcp Per   Note: This dictation was prepared with Dragon dictation along with smaller phrase technology. Any transcriptional errors that result from this process are unintentional.

## 2018-06-03 ENCOUNTER — Emergency Department
Admission: EM | Admit: 2018-06-03 | Discharge: 2018-06-04 | Discharge status: Against Medical Advice | Payer: Self-pay | Attending: Student in an Organized Health Care Education/Training Program | Admitting: Student in an Organized Health Care Education/Training Program

## 2018-06-03 ENCOUNTER — Other Ambulatory Visit: Payer: Self-pay

## 2018-06-03 ENCOUNTER — Encounter: Payer: Self-pay | Admitting: *Deleted

## 2018-06-03 ENCOUNTER — Emergency Department: Payer: Self-pay

## 2018-06-03 DIAGNOSIS — R4182 Altered mental status, unspecified: Secondary | ICD-10-CM | POA: Insufficient documentation

## 2018-06-03 DIAGNOSIS — F17210 Nicotine dependence, cigarettes, uncomplicated: Secondary | ICD-10-CM | POA: Insufficient documentation

## 2018-06-03 DIAGNOSIS — R778 Other specified abnormalities of plasma proteins: Secondary | ICD-10-CM

## 2018-06-03 DIAGNOSIS — R7989 Other specified abnormal findings of blood chemistry: Secondary | ICD-10-CM | POA: Insufficient documentation

## 2018-06-03 DIAGNOSIS — I252 Old myocardial infarction: Secondary | ICD-10-CM | POA: Insufficient documentation

## 2018-06-03 DIAGNOSIS — I10 Essential (primary) hypertension: Secondary | ICD-10-CM | POA: Insufficient documentation

## 2018-06-03 DIAGNOSIS — Z79899 Other long term (current) drug therapy: Secondary | ICD-10-CM | POA: Insufficient documentation

## 2018-06-03 DIAGNOSIS — R55 Syncope and collapse: Secondary | ICD-10-CM | POA: Insufficient documentation

## 2018-06-03 DIAGNOSIS — Z5321 Procedure and treatment not carried out due to patient leaving prior to being seen by health care provider: Secondary | ICD-10-CM | POA: Insufficient documentation

## 2018-06-03 LAB — URINALYSIS, COMPLETE (UACMP) WITH MICROSCOPIC
Bacteria, UA: NONE SEEN
Bilirubin Urine: NEGATIVE
GLUCOSE, UA: NEGATIVE mg/dL
Hgb urine dipstick: NEGATIVE
KETONES UR: NEGATIVE mg/dL
LEUKOCYTES UA: NEGATIVE
NITRITE: NEGATIVE
PH: 6 (ref 5.0–8.0)
Protein, ur: NEGATIVE mg/dL
SPECIFIC GRAVITY, URINE: 1.006 (ref 1.005–1.030)
SQUAMOUS EPITHELIAL / LPF: NONE SEEN (ref 0–5)

## 2018-06-03 LAB — URINE DRUG SCREEN, QUALITATIVE (ARMC ONLY)
Amphetamines, Ur Screen: NOT DETECTED
Benzodiazepine, Ur Scrn: NOT DETECTED
CANNABINOID 50 NG, UR ~~LOC~~: NOT DETECTED
Cocaine Metabolite,Ur ~~LOC~~: NOT DETECTED
MDMA (ECSTASY) UR SCREEN: NOT DETECTED
Methadone Scn, Ur: NOT DETECTED
Opiate, Ur Screen: POSITIVE — AB
Phencyclidine (PCP) Ur S: NOT DETECTED
TRICYCLIC, UR SCREEN: NOT DETECTED

## 2018-06-03 LAB — CBC WITH DIFFERENTIAL/PLATELET
Basophils Absolute: 0 10*3/uL (ref 0–0.1)
Basophils Relative: 1 %
Eosinophils Absolute: 0.1 10*3/uL (ref 0–0.7)
Eosinophils Relative: 3 %
HEMATOCRIT: 36.1 % — AB (ref 40.0–52.0)
Hemoglobin: 11.8 g/dL — ABNORMAL LOW (ref 13.0–18.0)
LYMPHS PCT: 18 %
Lymphs Abs: 0.7 10*3/uL — ABNORMAL LOW (ref 1.0–3.6)
MCH: 26.4 pg (ref 26.0–34.0)
MCHC: 32.8 g/dL (ref 32.0–36.0)
MCV: 80.6 fL (ref 80.0–100.0)
MONO ABS: 0.4 10*3/uL (ref 0.2–1.0)
MONOS PCT: 10 %
NEUTROS ABS: 2.6 10*3/uL (ref 1.4–6.5)
Neutrophils Relative %: 68 %
Platelets: 139 10*3/uL — ABNORMAL LOW (ref 150–440)
RBC: 4.48 MIL/uL (ref 4.40–5.90)
RDW: 14.9 % — AB (ref 11.5–14.5)
WBC: 3.8 10*3/uL (ref 3.8–10.6)

## 2018-06-03 LAB — COMPREHENSIVE METABOLIC PANEL
ALK PHOS: 159 U/L — AB (ref 38–126)
ALT: 15 U/L (ref 0–44)
ANION GAP: 6 (ref 5–15)
AST: 27 U/L (ref 15–41)
Albumin: 3.5 g/dL (ref 3.5–5.0)
BILIRUBIN TOTAL: 0.6 mg/dL (ref 0.3–1.2)
BUN: 26 mg/dL — AB (ref 8–23)
CALCIUM: 8.6 mg/dL — AB (ref 8.9–10.3)
CO2: 26 mmol/L (ref 22–32)
Chloride: 103 mmol/L (ref 98–111)
Creatinine, Ser: 0.65 mg/dL (ref 0.61–1.24)
GFR calc Af Amer: >60 mL/min (ref >60)
Glucose, Bld: 93 mg/dL (ref 70–99)
POTASSIUM: 4.2 mmol/L (ref 3.5–5.1)
Sodium: 135 mmol/L (ref 135–145)
TOTAL PROTEIN: 6.8 g/dL (ref 6.5–8.1)

## 2018-06-03 LAB — LACTIC ACID, PLASMA: LACTIC ACID, VENOUS: 1.1 mmol/L (ref 0.5–1.9)

## 2018-06-03 LAB — GLUCOSE, CAPILLARY: Glucose-Capillary: 103 mg/dL — ABNORMAL HIGH (ref 70–99)

## 2018-06-03 LAB — TROPONIN I: TROPONIN I: 0.06 ng/mL — AB (ref <0.03)

## 2018-06-03 LAB — AMMONIA: AMMONIA: 24 umol/L (ref 9–35)

## 2018-06-03 MED ORDER — ASPIRIN 81 MG PO CHEW
324.0000 mg | CHEWABLE_TABLET | Freq: Once | ORAL | Status: AC
  Administered 2018-06-04: 324 mg via ORAL
  Filled 2018-06-03: qty 4

## 2018-06-03 MED ORDER — SODIUM CHLORIDE 0.9 % IV BOLUS
1000.0000 mL | Freq: Once | INTRAVENOUS | Status: AC
  Administered 2018-06-03: 1000 mL via INTRAVENOUS

## 2018-06-03 NOTE — ED Provider Notes (Addendum)
[facility] Emergency Department Provider Note    First MD Initiated Contact with Patient 06/03/18 2032     (approximate)  I have reviewed the triage vital signs and the nursing notes.   HISTORY  Chief Complaint Altered Mental Status  Level V caveat:  Acute encephalopathy  HPI Paul Day is a 62 y.o. male presents to the ER with chief complaint of altered mental status.  Patient picked up by EMS outside wraparound in a blanket and soiled pants.  Patient has presented to the ER for similar symptoms does have a long history of polysubstance abuse.  Denies any alcohol.  States he feels "foggy headed" does have a history of cirrhosis.  Has not had anything to eat in several days he states.   Past Medical History:  Diagnosis Date  . Collagen vascular disease (HCC)   . Hyperlipidemia   . Myocardial infarct (HCC)   . Rheumatoid arthritis(714.0)    History reviewed. No pertinent family history. Past Surgical History:  Procedure Laterality Date  . LEG SURGERY Right    Patient Active Problem List   Diagnosis Date Noted  . Protein-calorie malnutrition, severe 03/28/2018  . Fever 03/26/2018  . Opiate withdrawal (HCC) 08/20/2016  . Opiate abuse, continuous (HCC) 08/20/2016  . Malignant essential hypertension 08/19/2016  . Sinus tachycardia 08/19/2016  . Tachycardia 02/13/2016  . Unstable angina (HCC) 02/11/2016  . CAP (community acquired pneumonia) 02/11/2016  . Severe sepsis (HCC) 02/11/2016  . Rheumatoid arthritis (HCC)       Prior to Admission medications   Medication Sig Start Date End Date Taking? Authorizing Provider  acidophilus (RISAQUAD) CAPS capsule Take 1 capsule by mouth 3 (three) times daily with meals. 03/30/18   Delfino Lovett, MD  albuterol (PROVENTIL HFA;VENTOLIN HFA) 108 (90 Base) MCG/ACT inhaler Inhale 2 puffs into the lungs every 6 (six) hours as needed for wheezing or shortness of breath. 03/30/18   Delfino Lovett, MD  amitriptyline  (ELAVIL) 50 MG tablet Take 1 tablet (50 mg total) by mouth at bedtime. 03/30/18   Delfino Lovett, MD  diltiazem (CARDIZEM) 60 MG tablet Take 1 tablet (60 mg total) by mouth every 8 (eight) hours. 03/30/18   Delfino Lovett, MD  feeding supplement, ENSURE ENLIVE, (ENSURE ENLIVE) LIQD Take 237 mLs by mouth 3 (three) times daily between meals. 03/30/18   Delfino Lovett, MD  gabapentin (NEURONTIN) 100 MG capsule Take 2 capsules (200 mg total) by mouth 3 (three) times daily. 03/30/18   Delfino Lovett, MD  methimazole (TAPAZOLE) 10 MG tablet Take 3 tablets (30 mg total) by mouth daily. 03/30/18   Delfino Lovett, MD    Allergies Norco [hydrocodone-acetaminophen]    Social History Social History   Tobacco Use  . Smoking status: Current Every Day Smoker    Packs/day: 1.50    Types: Cigarettes  . Smokeless tobacco: Never Used  Substance Use Topics  . Alcohol use: No    Alcohol/week: 0.0 oz  . Drug use: Yes    Types: Cocaine, Marijuana    Comment: last used marijuana 2-3 days ago    Review of Systems Patient denies headaches, rhinorrhea, blurry vision, numbness, shortness of breath, chest pain, edema, cough, abdominal pain, nausea, vomiting, diarrhea, dysuria, fevers, rashes or hallucinations unless otherwise stated above in HPI. ____________________________________________   PHYSICAL EXAM:  VITAL SIGNS: Vitals:   06/03/18 2230 06/03/18 2300  BP: (!) 112/56 116/64  Pulse: (!) 114 (!) 111  Resp: (!) 21 19  Temp:  SpO2: 91% 94%    Constitutional: drowsy and encephalopathic, but protecting his airway Eyes: Conjunctivae are normal.  Head: Atraumatic. Nose: No congestion/rhinnorhea. Mouth/Throat: Mucous membranes are moist.   Neck: No stridor. Painless ROM.  Cardiovascular: tachycardic regular rhythm. Grossly normal heart sounds.  Good peripheral circulation. Respiratory: Normal respiratory effort.  No retractions. Lungs CTAB. Gastrointestinal: Soft and nontender. No distention. No abdominal  bruits. No CVA tenderness. Genitourinary: dferred Musculoskeletal: No lower extremity tenderness nor edema.  No joint effusions. Neurologic:  No facial droop, MAE spontaneously Skin:  Skin is warm, dry and intact. No rash noted.____________________________________________   LABS (all labs ordered are listed, but only abnormal results are displayed)  Results for orders placed or performed during the hospital encounter of 06/03/18 (from the past 24 hour(s))  CBC with Differential/Platelet     Status: Abnormal   Collection Time: 06/03/18  8:39 PM  Result Value Ref Range   WBC 3.8 3.8 - 10.6 K/uL   RBC 4.48 4.40 - 5.90 MIL/uL   Hemoglobin 11.8 (L) 13.0 - 18.0 g/dL   HCT 67.5 (L) 91.6 - 38.4 %   MCV 80.6 80.0 - 100.0 fL   MCH 26.4 26.0 - 34.0 pg   MCHC 32.8 32.0 - 36.0 g/dL   RDW 66.5 (H) 99.3 - 57.0 %   Platelets 139 (L) 150 - 440 K/uL   Neutrophils Relative % 68 %   Neutro Abs 2.6 1.4 - 6.5 K/uL   Lymphocytes Relative 18 %   Lymphs Abs 0.7 (L) 1.0 - 3.6 K/uL   Monocytes Relative 10 %   Monocytes Absolute 0.4 0.2 - 1.0 K/uL   Eosinophils Relative 3 %   Eosinophils Absolute 0.1 0 - 0.7 K/uL   Basophils Relative 1 %   Basophils Absolute 0.0 0 - 0.1 K/uL  Comprehensive metabolic panel     Status: Abnormal   Collection Time: 06/03/18  8:39 PM  Result Value Ref Range   Sodium 135 135 - 145 mmol/L   Potassium 4.2 3.5 - 5.1 mmol/L   Chloride 103 98 - 111 mmol/L   CO2 26 22 - 32 mmol/L   Glucose, Bld 93 70 - 99 mg/dL   BUN 26 (H) 8 - 23 mg/dL   Creatinine, Ser 1.77 0.61 - 1.24 mg/dL   Calcium 8.6 (L) 8.9 - 10.3 mg/dL   Total Protein 6.8 6.5 - 8.1 g/dL   Albumin 3.5 3.5 - 5.0 g/dL   AST 27 15 - 41 U/L   ALT 15 0 - 44 U/L   Alkaline Phosphatase 159 (H) 38 - 126 U/L   Total Bilirubin 0.6 0.3 - 1.2 mg/dL   GFR calc non Af Amer >60 >60 mL/min   GFR calc Af Amer >60 >60 mL/min   Anion gap 6 5 - 15  Troponin I     Status: Abnormal   Collection Time: 06/03/18  8:39 PM  Result Value  Ref Range   Troponin I 0.06 (HH) <0.03 ng/mL  Urinalysis, Complete w Microscopic     Status: Abnormal   Collection Time: 06/03/18  8:39 PM  Result Value Ref Range   Color, Urine STRAW (A) YELLOW   APPearance CLEAR (A) CLEAR   Specific Gravity, Urine 1.006 1.005 - 1.030   pH 6.0 5.0 - 8.0   Glucose, UA NEGATIVE NEGATIVE mg/dL   Hgb urine dipstick NEGATIVE NEGATIVE   Bilirubin Urine NEGATIVE NEGATIVE   Ketones, ur NEGATIVE NEGATIVE mg/dL   Protein, ur NEGATIVE NEGATIVE mg/dL  Nitrite NEGATIVE NEGATIVE   Leukocytes, UA NEGATIVE NEGATIVE   RBC / HPF 0-5 0 - 5 RBC/hpf   WBC, UA 0-5 0 - 5 WBC/hpf   Bacteria, UA NONE SEEN NONE SEEN   Squamous Epithelial / LPF NONE SEEN 0 - 5  Urine Drug Screen, Qualitative ([facility] only)     Status: Abnormal   Collection Time: 06/03/18  8:39 PM  Result Value Ref Range   Tricyclic, Ur Screen NONE DETECTED NONE DETECTED   Amphetamines, Ur Screen NONE DETECTED NONE DETECTED   MDMA (Ecstasy)Ur Screen NONE DETECTED NONE DETECTED   Cocaine Metabolite,Ur [place] NONE DETECTED NONE DETECTED   Opiate, Ur Screen POSITIVE (A) NONE DETECTED   Phencyclidine (PCP) Ur S NONE DETECTED NONE DETECTED   Cannabinoid 50 Ng, Ur [place] NONE DETECTED NONE DETECTED   Barbiturates, Ur Screen (A) NONE DETECTED    Result not available. Reagent lot number recalled by manufacturer.   Benzodiazepine, Ur Scrn NONE DETECTED NONE DETECTED   Methadone Scn, Ur NONE DETECTED NONE DETECTED  Glucose, capillary     Status: Abnormal   Collection Time: 06/03/18  8:44 PM  Result Value Ref Range   Glucose-Capillary 103 (H) 70 - 99 mg/dL  Lactic acid, plasma     Status: None   Collection Time: 06/03/18  9:30 PM  Result Value Ref Range   Lactic Acid, Venous 1.1 0.5 - 1.9 mmol/L  Ammonia     Status: None   Collection Time: 06/03/18  9:52 PM  Result Value Ref Range   Ammonia 24 9 - 35 umol/L   ____________________________________________  EKG My review and personal interpretation at Time: 20:36     Indication: ams  Rate: 125  Rhythm: sinus Axis: normal Other: normal intervals, no stemi, nonspecific st abn ____________________________________________  RADIOLOGY  I personally reviewed all radiographic images ordered to evaluate for the above acute complaints and reviewed radiology reports and findings.  These findings were personally discussed with the patient.  Please see medical record for radiology report.  ____________________________________________   PROCEDURES  Procedure(s) performed:  .Critical Care Performed by: Willy Eddy, MD Authorized by: Willy Eddy, MD   Critical care provider statement:    Critical care time (minutes):  35   Critical care time was exclusive of:  Separately billable procedures and treating other patients   Critical care was necessary to treat or prevent imminent or life-threatening deterioration of the following conditions:  CNS failure or compromise   Critical care was time spent personally by me on the following activities:  Development of treatment plan with patient or surrogate, discussions with consultants, evaluation of patient's response to treatment, examination of patient, obtaining history from patient or surrogate, ordering and performing treatments and interventions, ordering and review of laboratory studies, ordering and review of radiographic studies, pulse oximetry, re-evaluation of patient's condition and review of old charts      Critical Care performed: yes  ____________________________________________   INITIAL IMPRESSION / ASSESSMENT AND PLAN / ED COURSE  Pertinent labs & imaging results that were available during my care of the patient were reviewed by me and considered in my medical decision making (see chart for details).   DDX: Dehydration, sepsis, pna, uti, hypoglycemia, cva, drug effect, withdrawal, encephalitis   Paul Day is a 62 y.o. who presents to the ED with symptoms as described above.   Seems to have presented after syncopal event.  Patient is drowsy but does have known history of narcotic abuse.  He is maintaining  his respirations without any hypoxia therefore do not suspect overdose.  No pinpoint pupils on exam.  Blood work sent for the above differential given his extensive past medical history and broad differential for this presentation.  No evidence of an septic process at this time.  EKG does show sinus tachycardia with nonspecific changes and his troponin is elevated at 0.06 with patient denies any chest pain at this time.  CT head does not show any evidence of traumatic injury.  Ammonia is normal.  Has also sent off thyroid panel to evaluate for thyrotoxicosis.  Based on his risk factors presentation with elevated troponin and syncopal event do feel patient will require hospitalization for further hemodynamic monitoring.      As part of my medical decision making, I reviewed the following data within the electronic MEDICAL RECORD NUMBER Nursing notes reviewed and incorporated, Labs reviewed, notes from prior ED visits.   ____________________________________________   FINAL CLINICAL IMPRESSION(S) / ED DIAGNOSES  Final diagnoses:  Altered mental status, unspecified altered mental status type  Syncope and collapse  Elevated troponin I level      NEW MEDICATIONS STARTED DURING THIS VISIT:  New Prescriptions   No medications on file     Note:  This document was prepared using Dragon voice recognition software and may include unintentional dictation errors.    Willy Eddy, MD 06/03/18 1610    Willy Eddy, MD 06/14/18 [phone]

## 2018-06-03 NOTE — ED Triage Notes (Signed)
Pt presents via EMS from probable home. Pt states he "just went out". Pt found outside of apartments where pt states he resides w/ his son. Pt states he had episode of incontinent diarrhea x 1 prior to losing consciousness. Pt states no difficulties at home w/ son.

## 2018-06-03 NOTE — ED Notes (Signed)
Date and time results received: 06/03/18 2112   Test: Troponin Critical Value: 0.06 ng/mL  Name of Provider Notified: Dr. Roxan Hockey

## 2018-06-03 NOTE — ED Triage Notes (Signed)
Pt states he "couldn't move" which is why he has remained in soiled clothing.

## 2018-06-04 LAB — TSH

## 2018-06-04 LAB — T4, FREE: FREE T4: 4.14 ng/dL — AB (ref 0.82–1.77)

## 2018-06-04 NOTE — ED Provider Notes (Signed)
-----------------------------------------   12:47 AM on 06/04/2018 -----------------------------------------  Patient care assumed from Dr. Roxan Hockey.  Patient is awake alert oriented.  States he does not want to be admitted to the hospital, is asking to be discharged home.  Patient did have an elevated troponin.  I discussed this with the patient he states he feels much better and does not want to be admitted regardless.  He will sign out AGAINST MEDICAL ADVICE.  Patient is very pleasant.   Minna Antis, MD 06/04/18 [phone]

## 2018-06-04 NOTE — ED Notes (Signed)
Pt and family state that pt actually overdosed on fentanyl and that's why he passed out. Pt states he feels fine and does not want to be admitted to the hospital. Pt is alert and oriented. Pt is willing to sign out AMA.

## 2018-11-24 ENCOUNTER — Emergency Department: Payer: Self-pay

## 2018-11-24 ENCOUNTER — Inpatient Hospital Stay
Admission: EM | Admit: 2018-11-24 | Discharge: 2018-11-25 | DRG: 643 | Disposition: A | Discharge status: Home / Self Care | Payer: Self-pay | Attending: Internal Medicine | Admitting: Internal Medicine

## 2018-11-24 ENCOUNTER — Other Ambulatory Visit: Payer: Self-pay

## 2018-11-24 DIAGNOSIS — G894 Chronic pain syndrome: Secondary | ICD-10-CM | POA: Diagnosis present

## 2018-11-24 DIAGNOSIS — F17210 Nicotine dependence, cigarettes, uncomplicated: Secondary | ICD-10-CM | POA: Diagnosis present

## 2018-11-24 DIAGNOSIS — I252 Old myocardial infarction: Secondary | ICD-10-CM

## 2018-11-24 DIAGNOSIS — E0591 Thyrotoxicosis, unspecified with thyrotoxic crisis or storm: Secondary | ICD-10-CM

## 2018-11-24 DIAGNOSIS — I10 Essential (primary) hypertension: Secondary | ICD-10-CM | POA: Diagnosis present

## 2018-11-24 DIAGNOSIS — E43 Unspecified severe protein-calorie malnutrition: Secondary | ICD-10-CM | POA: Diagnosis present

## 2018-11-24 DIAGNOSIS — R0902 Hypoxemia: Secondary | ICD-10-CM | POA: Diagnosis present

## 2018-11-24 DIAGNOSIS — E0501 Thyrotoxicosis with diffuse goiter with thyrotoxic crisis or storm: Principal | ICD-10-CM | POA: Diagnosis present

## 2018-11-24 DIAGNOSIS — I248 Other forms of acute ischemic heart disease: Secondary | ICD-10-CM | POA: Diagnosis present

## 2018-11-24 DIAGNOSIS — R079 Chest pain, unspecified: Secondary | ICD-10-CM

## 2018-11-24 DIAGNOSIS — Z885 Allergy status to narcotic agent status: Secondary | ICD-10-CM

## 2018-11-24 DIAGNOSIS — M069 Rheumatoid arthritis, unspecified: Secondary | ICD-10-CM | POA: Diagnosis present

## 2018-11-24 DIAGNOSIS — R7989 Other specified abnormal findings of blood chemistry: Secondary | ICD-10-CM | POA: Diagnosis present

## 2018-11-24 DIAGNOSIS — E785 Hyperlipidemia, unspecified: Secondary | ICD-10-CM | POA: Diagnosis present

## 2018-11-24 DIAGNOSIS — Z79899 Other long term (current) drug therapy: Secondary | ICD-10-CM

## 2018-11-24 DIAGNOSIS — E0590 Thyrotoxicosis, unspecified without thyrotoxic crisis or storm: Secondary | ICD-10-CM | POA: Diagnosis present

## 2018-11-24 DIAGNOSIS — I2510 Atherosclerotic heart disease of native coronary artery without angina pectoris: Secondary | ICD-10-CM | POA: Diagnosis present

## 2018-11-24 DIAGNOSIS — Z681 Body mass index (BMI) 19 or less, adult: Secondary | ICD-10-CM

## 2018-11-24 LAB — COMPREHENSIVE METABOLIC PANEL
ALBUMIN: 4.6 g/dL (ref 3.5–5.0)
ALK PHOS: 161 U/L — AB (ref 38–126)
ALT: 22 U/L (ref 0–44)
ANION GAP: 11 (ref 5–15)
AST: 29 U/L (ref 15–41)
BILIRUBIN TOTAL: 0.7 mg/dL (ref 0.3–1.2)
BUN: 15 mg/dL (ref 8–23)
CALCIUM: 10.1 mg/dL (ref 8.9–10.3)
CO2: 28 mmol/L (ref 22–32)
Chloride: 102 mmol/L (ref 98–111)
Creatinine, Ser: 0.72 mg/dL (ref 0.61–1.24)
GFR calc Af Amer: >60 mL/min (ref >60)
GFR calc non Af Amer: >60 mL/min (ref >60)
GLUCOSE: 105 mg/dL — AB (ref 70–99)
Potassium: 4.1 mmol/L (ref 3.5–5.1)
Sodium: 141 mmol/L (ref 135–145)
TOTAL PROTEIN: 8.2 g/dL — AB (ref 6.5–8.1)

## 2018-11-24 LAB — URINALYSIS, COMPLETE (UACMP) WITH MICROSCOPIC
BACTERIA UA: NONE SEEN
Bilirubin Urine: NEGATIVE
Glucose, UA: NEGATIVE mg/dL
Ketones, ur: 5 mg/dL — AB
Leukocytes, UA: NEGATIVE
Nitrite: NEGATIVE
Protein, ur: 100 mg/dL — AB
RBC / HPF: >50 RBC/hpf — ABNORMAL HIGH (ref 0–5)
SPECIFIC GRAVITY, URINE: 1.026 (ref 1.005–1.030)
pH: 8 (ref 5.0–8.0)

## 2018-11-24 LAB — CBC WITH DIFFERENTIAL/PLATELET
Abs Immature Granulocytes: 0.01 10*3/uL (ref 0.00–0.07)
Basophils Absolute: 0 10*3/uL (ref 0.0–0.1)
Basophils Relative: 1 %
Eosinophils Absolute: 0 10*3/uL (ref 0.0–0.5)
Eosinophils Relative: 1 %
HEMATOCRIT: 45.7 % (ref 39.0–52.0)
HEMOGLOBIN: 15 g/dL (ref 13.0–17.0)
Immature Granulocytes: 0 %
LYMPHS ABS: 1.1 10*3/uL (ref 0.7–4.0)
LYMPHS PCT: 25 %
MCH: 25.8 pg — ABNORMAL LOW (ref 26.0–34.0)
MCHC: 32.8 g/dL (ref 30.0–36.0)
MCV: 78.7 fL — ABNORMAL LOW (ref 80.0–100.0)
MONOS PCT: 7 %
Monocytes Absolute: 0.3 10*3/uL (ref 0.1–1.0)
Neutro Abs: 3.1 10*3/uL (ref 1.7–7.7)
Neutrophils Relative %: 66 %
Platelets: 219 10*3/uL (ref 150–400)
RBC: 5.81 MIL/uL (ref 4.22–5.81)
RDW: 13.5 % (ref 11.5–15.5)
WBC: 4.6 10*3/uL (ref 4.0–10.5)
nRBC: 0 % (ref 0.0–0.2)

## 2018-11-24 LAB — URINE DRUG SCREEN, QUALITATIVE (ARMC ONLY)
Amphetamines, Ur Screen: NOT DETECTED
Barbiturates, Ur Screen: NOT DETECTED
Benzodiazepine, Ur Scrn: NOT DETECTED
COCAINE METABOLITE, UR ~~LOC~~: NOT DETECTED
Cannabinoid 50 Ng, Ur ~~LOC~~: NOT DETECTED
MDMA (ECSTASY) UR SCREEN: NOT DETECTED
METHADONE SCREEN, URINE: NOT DETECTED
Opiate, Ur Screen: NOT DETECTED
Phencyclidine (PCP) Ur S: NOT DETECTED
TRICYCLIC, UR SCREEN: NOT DETECTED

## 2018-11-24 LAB — TROPONIN I
Troponin I: 0.1 ng/mL (ref <0.03)
Troponin I: 0.1 ng/mL (ref <0.03)

## 2018-11-24 LAB — ETHANOL

## 2018-11-24 LAB — LIPASE, BLOOD: Lipase: 27 U/L (ref 11–51)

## 2018-11-24 LAB — TSH

## 2018-11-24 LAB — T4, FREE: Free T4: 5.14 ng/dL — ABNORMAL HIGH (ref 0.82–1.77)

## 2018-11-24 LAB — MRSA PCR SCREENING: MRSA by PCR: POSITIVE — AB

## 2018-11-24 MED ORDER — ONDANSETRON HCL 4 MG PO TABS: 4.0000 mg | ORAL_TABLET | Freq: Four times a day (QID) | ORAL | Status: DC | PRN

## 2018-11-24 MED ORDER — PROPRANOLOL HCL 20 MG PO TABS
60.0000 mg | ORAL_TABLET | Freq: Four times a day (QID) | ORAL | Status: DC
  Administered 2018-11-24 – 2018-11-25 (×3): 60 mg via ORAL
  Filled 2018-11-24 (×5): qty 3

## 2018-11-24 MED ORDER — ACETAMINOPHEN 650 MG RE SUPP: 650.0000 mg | Freq: Four times a day (QID) | RECTAL | Status: DC | PRN

## 2018-11-24 MED ORDER — ONDANSETRON HCL 4 MG/2ML IJ SOLN: 4.0000 mg | Freq: Four times a day (QID) | INTRAMUSCULAR | Status: DC | PRN

## 2018-11-24 MED ORDER — GABAPENTIN 100 MG PO CAPS
200.0000 mg | ORAL_CAPSULE | Freq: Three times a day (TID) | ORAL | Status: DC
  Administered 2018-11-24 – 2018-11-25 (×3): 200 mg via ORAL
  Filled 2018-11-24 (×3): qty 2

## 2018-11-24 MED ORDER — HYDRALAZINE HCL 20 MG/ML IJ SOLN: 5.0000 mg | INTRAMUSCULAR | Status: DC | PRN

## 2018-11-24 MED ORDER — METHIMAZOLE 10 MG PO TABS
30.0000 mg | ORAL_TABLET | Freq: Every day | ORAL | Status: DC
  Administered 2018-11-24 – 2018-11-25 (×2): 30 mg via ORAL
  Filled 2018-11-24 (×2): qty 3

## 2018-11-24 MED ORDER — PROPRANOLOL HCL 1 MG/ML IV SOLN
1.0000 mg | Freq: Once | INTRAVENOUS | Status: AC
  Administered 2018-11-24: 1 mg via INTRAVENOUS
  Filled 2018-11-24: qty 1

## 2018-11-24 MED ORDER — DILTIAZEM HCL 60 MG PO TABS
60.0000 mg | ORAL_TABLET | Freq: Three times a day (TID) | ORAL | Status: DC
  Administered 2018-11-24 – 2018-11-25 (×3): 60 mg via ORAL
  Filled 2018-11-24: qty 1
  Filled 2018-11-24 (×3): qty 2

## 2018-11-24 MED ORDER — IOPAMIDOL (ISOVUE-370) INJECTION 76%
75.0000 mL | Freq: Once | INTRAVENOUS | Status: AC | PRN
  Administered 2018-11-24: 75 mL via INTRAVENOUS

## 2018-11-24 MED ORDER — FENTANYL CITRATE (PF) 100 MCG/2ML IJ SOLN
50.0000 ug | Freq: Once | INTRAMUSCULAR | Status: AC
  Administered 2018-11-24: 50 ug via INTRAVENOUS
  Filled 2018-11-24: qty 2

## 2018-11-24 MED ORDER — PROPRANOLOL HCL 1 MG/ML IV SOLN
1.0000 mg | Freq: Once | INTRAVENOUS | Status: AC
  Administered 2018-11-24: 1 mg via INTRAVENOUS
  Filled 2018-11-24 (×2): qty 1

## 2018-11-24 MED ORDER — HYDROCORTISONE NA SUCCINATE PF 100 MG IJ SOLR
100.0000 mg | Freq: Three times a day (TID) | INTRAMUSCULAR | Status: DC
  Administered 2018-11-24 – 2018-11-25 (×2): 100 mg via INTRAVENOUS
  Filled 2018-11-24 (×3): qty 2

## 2018-11-24 MED ORDER — SODIUM CHLORIDE 0.9% FLUSH
10.0000 mL | Freq: Two times a day (BID) | INTRAVENOUS | Status: DC
  Administered 2018-11-24 – 2018-11-25 (×2): 10 mL via INTRAVENOUS

## 2018-11-24 MED ORDER — ENOXAPARIN SODIUM 40 MG/0.4ML ~~LOC~~ SOLN
40.0000 mg | SUBCUTANEOUS | Status: DC
  Administered 2018-11-24: 40 mg via SUBCUTANEOUS
  Filled 2018-11-24: qty 0.4

## 2018-11-24 MED ORDER — MUPIROCIN 2 % EX OINT
1.0000 "application " | TOPICAL_OINTMENT | Freq: Two times a day (BID) | CUTANEOUS | Status: DC
  Administered 2018-11-24 – 2018-11-25 (×2): 1 via NASAL
  Filled 2018-11-24: qty 22

## 2018-11-24 MED ORDER — POLYETHYLENE GLYCOL 3350 17 G PO PACK: 17.0000 g | PACK | Freq: Every day | ORAL | Status: DC | PRN

## 2018-11-24 MED ORDER — OXYCODONE HCL 5 MG PO TABS
5.0000 mg | ORAL_TABLET | Freq: Four times a day (QID) | ORAL | Status: DC | PRN
  Administered 2018-11-24 – 2018-11-25 (×4): 5 mg via ORAL
  Filled 2018-11-24 (×4): qty 1

## 2018-11-24 MED ORDER — ENSURE ENLIVE PO LIQD
237.0000 mL | Freq: Three times a day (TID) | ORAL | Status: DC
  Administered 2018-11-24 – 2018-11-25 (×3): 237 mL via ORAL

## 2018-11-24 MED ORDER — SODIUM CHLORIDE 0.9 % IV BOLUS
1000.0000 mL | Freq: Once | INTRAVENOUS | Status: AC
  Administered 2018-11-24: 1000 mL via INTRAVENOUS

## 2018-11-24 MED ORDER — CHLORHEXIDINE GLUCONATE CLOTH 2 % EX PADS
6.0000 | MEDICATED_PAD | Freq: Every day | CUTANEOUS | Status: DC
  Administered 2018-11-25: 6 via TOPICAL

## 2018-11-24 MED ORDER — ONDANSETRON HCL 4 MG/2ML IJ SOLN
4.0000 mg | Freq: Once | INTRAMUSCULAR | Status: AC
  Administered 2018-11-24: 4 mg via INTRAVENOUS
  Filled 2018-11-24: qty 2

## 2018-11-24 MED ORDER — ACETAMINOPHEN 325 MG PO TABS
650.0000 mg | ORAL_TABLET | Freq: Four times a day (QID) | ORAL | Status: DC | PRN
  Administered 2018-11-24: 650 mg via ORAL
  Filled 2018-11-24: qty 2

## 2018-11-24 NOTE — ED Notes (Signed)
Patient reports 8/10 pain under left side rib cage. Pain meds given IVF infusing. Will monitor.

## 2018-11-24 NOTE — ED Notes (Signed)
Report given to receiving nurse

## 2018-11-24 NOTE — ED Provider Notes (Signed)
[facility] Emergency Department Provider Note  ____________________________________________   First MD Initiated Contact with Patient 11/24/18 1108     (approximate)  I have reviewed the triage vital signs and the nursing notes.   HISTORY  Chief Complaint Chest Pain   HPI Paul Day is a 62 y.o. male with a history of hyperlipidemia, MI as well as rheumatoid arthritis and malnutrition was presented emergency department today with 5 days of left-sided chest pain.  The patient says that the pain is sharp and worse with deep breathing.  Also associated with nausea vomiting and diarrhea.  Says the pain is intermittent but does not say that it has worsened with activity.  No radiation of the pain.  No reports of blood in the vomitus or the stool.  EMS brought in the patient with a heart rate in the 120s to 140s.  Patient reporting the pain is 10 out of 10.  EMS also reported that the patient was hypoxic in route.  However, on room air the patient is 97 to 90% here.    Past Medical History:  Diagnosis Date  . Collagen vascular disease (HCC)   . Hyperlipidemia   . Myocardial infarct (HCC)   . Rheumatoid arthritis(714.0)     Patient Active Problem List   Diagnosis Date Noted  . Protein-calorie malnutrition, severe 03/28/2018  . Fever 03/26/2018  . Opiate withdrawal (HCC) 08/20/2016  . Opiate abuse, continuous (HCC) 08/20/2016  . Malignant essential hypertension 08/19/2016  . Sinus tachycardia 08/19/2016  . Tachycardia 02/13/2016  . Unstable angina (HCC) 02/11/2016  . CAP (community acquired pneumonia) 02/11/2016  . Severe sepsis (HCC) 02/11/2016  . Rheumatoid arthritis [facility])     Past Surgical History:  Procedure Laterality Date  . LEG SURGERY Right     Prior to Admission medications   Medication Sig Start Date End Date Taking? Authorizing Provider  acidophilus (RISAQUAD) CAPS capsule Take 1 capsule by mouth 3 (three) times daily with meals.  03/30/18   Delfino Lovett, MD  albuterol (PROVENTIL HFA;VENTOLIN HFA) 108 (90 Base) MCG/ACT inhaler Inhale 2 puffs into the lungs every 6 (six) hours as needed for wheezing or shortness of breath. 03/30/18   Delfino Lovett, MD  amitriptyline (ELAVIL) 50 MG tablet Take 1 tablet (50 mg total) by mouth at bedtime. 03/30/18   Delfino Lovett, MD  diltiazem (CARDIZEM) 60 MG tablet Take 1 tablet (60 mg total) by mouth every 8 (eight) hours. 03/30/18   Delfino Lovett, MD  feeding supplement, ENSURE ENLIVE, (ENSURE ENLIVE) LIQD Take 237 mLs by mouth 3 (three) times daily between meals. 03/30/18   Delfino Lovett, MD  gabapentin (NEURONTIN) 100 MG capsule Take 2 capsules (200 mg total) by mouth 3 (three) times daily. 03/30/18   Delfino Lovett, MD  methimazole (TAPAZOLE) 10 MG tablet Take 3 tablets (30 mg total) by mouth daily. 03/30/18   Delfino Lovett, MD    Allergies Norco [hydrocodone-acetaminophen]  No family history on file.  Social History Social History   Tobacco Use  . Smoking status: Current Every Day Smoker    Packs/day: 1.50    Types: Cigarettes  . Smokeless tobacco: Never Used  Substance Use Topics  . Alcohol use: No    Alcohol/week: 0.0 standard drinks  . Drug use: Yes    Types: Cocaine, Marijuana    Comment: last used marijuana 2-3 days ago    Review of Systems  Constitutional: No fever/chills Eyes: No visual changes. ENT: No sore throat. Cardiovascular: As  above Respiratory: As above Gastrointestinal: No constipation. Genitourinary: Negative for dysuria. Musculoskeletal: Negative for back pain. Skin: Negative for rash. Neurological: Negative for headaches, focal weakness or numbness.   ____________________________________________   PHYSICAL EXAM:  VITAL SIGNS: ED Triage Vitals  Enc Vitals Group     BP      Pulse      Resp      Temp      Temp src      SpO2      Weight      Height      Head Circumference      Peak Flow      Pain Score      Pain Loc      Pain Edu?      Excl. in  GC?     Constitutional: Alert and oriented.  Patient is cachectic and appears malnourished. Eyes: Conjunctivae are normal.  Head: Atraumatic. Nose: No congestion/rhinnorhea. Mouth/Throat: Mucous membranes are moist.  Neck: No stridor.   Cardiovascular: Sinus tachycardia, regular rhythm. Grossly normal heart sounds.   Respiratory: Normal respiratory effort.  No retractions. Lungs CTAB. Gastrointestinal: Soft with tenderness to left upper quadrant as well as epigastrium. No distention.  Musculoskeletal: No lower extremity tenderness nor edema.  No joint effusions. Neurologic:  Normal speech and language. No gross focal neurologic deficits are appreciated. Skin:  Skin is warm, dry and intact. No rash noted. Psychiatric: Mood and affect are normal. Speech and behavior are normal.  ____________________________________________   LABS (all labs ordered are listed, but only abnormal results are displayed)  Labs Reviewed - No data to display ____________________________________________  EKG  ED ECG REPORT I, Arelia Longest, the attending physician, personally viewed and interpreted this ECG.   Date: 11/24/2018  EKG Time: 1112  Rate: 141  Rhythm: sinus tachycardia  Axis: Normal axis  Intervals:none  ST&T Change: No ST segment elevation or depression.  No abnormal T wave inversion.  Likely read from the EKG machine of ST elevations in the precordial leads secondary to baseline wander.  ____________________________________________  RADIOLOGY  Chest x-ray with hyperexpansion without acute cardiopulmonary findings.  CT angiography without evidence of pulmonary embolism.  Improved bibasilar aeration.  Emphysema present.  Bilateral pulmonary nodules. ____________________________________________   PROCEDURES  Procedure(s) performed:   .Critical Care Performed by: Myrna Blazer, MD Authorized by: Myrna Blazer, MD   Critical care provider statement:     Critical care time (minutes):  35   Critical care time was exclusive of:  Separately billable procedures and treating other patients   Critical care was necessary to treat or prevent imminent or life-threatening deterioration of the following conditions:  Metabolic crisis   Critical care was time spent personally by me on the following activities:  Development of treatment plan with patient or surrogate, discussions with consultants, evaluation of patient's response to treatment, examination of patient, obtaining history from patient or surrogate, ordering and performing treatments and interventions, ordering and review of laboratory studies, ordering and review of radiographic studies, pulse oximetry, re-evaluation of patient's condition and review of old charts    Critical Care performed:    ____________________________________________   INITIAL IMPRESSION / ASSESSMENT AND PLAN / ED COURSE  Pertinent labs & imaging results that were available during my care of the patient were reviewed by me and considered in my medical decision making (see chart for details).  Differential diagnosis includes, but is not limited to, ACS, aortic dissection, pulmonary embolism, cardiac tamponade, pneumothorax, pneumonia, pericarditis,  myocarditis, GI-related causes including esophagitis/gastritis, and musculoskeletal chest wall pain.   As part of my medical decision making, I reviewed the following data within the electronic MEDICAL RECORD NUMBER Notes from prior ED visits  ----------------------------------------- 2:24 PM on 11/24/2018 -----------------------------------------  Patient with undetectable TSH.  Propranolol as well as fluids ordered.  Patient still mentating well.  Heart rate of 125 at this time.  Patient to be admitted to the hospital.  Signed out to Dr. Luberta Mutter.  Patient aware of need for admission and willing to comply. ____________________________________________   FINAL CLINICAL  IMPRESSION(S) / ED DIAGNOSES  Thyrotoxicosis.  NEW MEDICATIONS STARTED DURING THIS VISIT:  New Prescriptions   No medications on file     Note:  This document was prepared using Dragon voice recognition software and may include unintentional dictation errors.     Myrna Blazer, MD 11/24/18 1425

## 2018-11-24 NOTE — H&P (Signed)
[facility] [place] [facility]   PATIENT NAME: Paul Day    MR#:  [id number]  DATE OF BIRTH:  03/06/1956  DATE OF ADMISSION:  11/24/2018  PRIMARY CARE PHYSICIAN: Patient, No Pcp Per   REQUESTING/REFERRING PHYSICIAN: Gladstone Pih, MD  CHIEF COMPLAINT:   Chief Complaint  Patient presents with  . Chest Pain    HISTORY OF PRESENT ILLNESS:  Rollan Elsenpeter  is a 62 y.o. male with a known history of collagen vascular disease, rheumatoid arthritis, hyperlipidemia, and CAD with hx of MI who presented to the ED with chest pain, shortness of breath, and weakness for the last week. He states that he thinks his thyroid is acting up. He has been on methimazole in the past, but has not taken this for many months because he ran out. His chest pain is located on the left side of his chest and feels like an "ache".   In the ED, he was hypertensive to the 170s systolic and tachycardic in the 120s-140s. Labs were significant for troponin 0.10, undetectable TSH, and T4 5.14. He was given propranolol x 2. Hospitalists were called for admission.  PAST MEDICAL HISTORY:   Past Medical History:  Diagnosis Date  . Collagen vascular disease (HCC)   . Hyperlipidemia   . Myocardial infarct (HCC)   . Rheumatoid arthritis(714.0)     PAST SURGICAL HISTORY:   Past Surgical History:  Procedure Laterality Date  . LEG SURGERY Right     SOCIAL HISTORY:   Social History   Tobacco Use  . Smoking status: Current Every Day Smoker    Packs/day: 1.50    Types: Cigarettes  . Smokeless tobacco: Never Used  Substance Use Topics  . Alcohol use: No    Alcohol/week: 0.0 standard drinks    FAMILY HISTORY:  Father- alcohol use  DRUG ALLERGIES:   Allergies  Allergen Reactions  . Norco [Hydrocodone-Acetaminophen] Itching and Rash    REVIEW OF SYSTEMS:   Review of Systems  Constitutional: Negative for chills and fever.  HENT: Negative for congestion and sore throat.     Eyes: Negative for blurred vision and double vision.  Respiratory: Positive for shortness of breath. Negative for cough.   Cardiovascular: Positive for chest pain. Negative for palpitations and leg swelling.  Gastrointestinal: Negative for abdominal pain, nausea and vomiting.  Genitourinary: Negative for dysuria and urgency.  Musculoskeletal: Positive for back pain, joint pain and neck pain.  Neurological: Negative for dizziness and headaches.  Psychiatric/Behavioral: Negative for depression. The patient is not nervous/anxious.     MEDICATIONS AT HOME:   Prior to Admission medications   Medication Sig Start Date End Date Taking? Authorizing Provider  amitriptyline (ELAVIL) 50 MG tablet Take 1 tablet (50 mg total) by mouth at bedtime. 03/30/18  Yes Delfino Lovett, MD  diltiazem (CARDIZEM) 60 MG tablet Take 1 tablet (60 mg total) by mouth every 8 (eight) hours. 03/30/18  Yes Delfino Lovett, MD  gabapentin (NEURONTIN) 100 MG capsule Take 2 capsules (200 mg total) by mouth 3 (three) times daily. 03/30/18  Yes Delfino Lovett, MD  methimazole (TAPAZOLE) 10 MG tablet Take 3 tablets (30 mg total) by mouth daily. 03/30/18  Yes Delfino Lovett, MD  acidophilus (RISAQUAD) CAPS capsule Take 1 capsule by mouth 3 (three) times daily with meals. Patient not taking: Reported on 11/24/2018 03/30/18   Delfino Lovett, MD  albuterol (PROVENTIL HFA;VENTOLIN HFA) 108 (90 Base) MCG/ACT inhaler Inhale 2 puffs into the lungs every 6 (six) hours  as needed for wheezing or shortness of breath. Patient not taking: Reported on 11/24/2018 03/30/18   Delfino Lovett, MD  feeding supplement, ENSURE ENLIVE, (ENSURE ENLIVE) LIQD Take 237 mLs by mouth 3 (three) times daily between meals. Patient not taking: Reported on 11/24/2018 03/30/18   Delfino Lovett, MD      VITAL SIGNS:  Blood pressure (!) 167/89, pulse (!) 124, temperature 98.6 F (37 C), temperature source Oral, resp. rate (!) 27, height 5\' 6"  (1.676 m), weight 43.1 kg, SpO2 96  %.  PHYSICAL EXAMINATION:  Physical Exam  GENERAL:  62 y.o.-year-old patient lying in the bed with no acute distress. Cachetic-appearing. EYES: Pupils equal, round, reactive to light and accommodation. No scleral icterus. Extraocular muscles intact. +exophthalmos bilaterally HEENT: Head atraumatic, normocephalic. Oropharynx and nasopharynx clear.  NECK:  Supple, no jugular venous distention. No thyroid enlargement or nodules appreciated.  LUNGS: Normal breath sounds bilaterally, no wheezing, rales,rhonchi or crepitation. No use of accessory muscles of respiration.  CARDIOVASCULAR: Tachycardic, regular rhythm, S1, S2 normal. No murmurs, rubs, or gallops.  ABDOMEN: Soft, nontender, nondistended. Bowel sounds present. No organomegaly or mass.  EXTREMITIES: No pedal edema, cyanosis, or clubbing.  NEUROLOGIC: Cranial nerves II through XII are intact. +global weakness. Sensation intact. Gait not checked.  PSYCHIATRIC: The patient is alert and oriented x 3.  SKIN: No obvious rash, lesion, or ulcer.   LABORATORY PANEL:   CBC Recent Labs  Lab 11/24/18 1114  WBC 4.6  HGB 15.0  HCT 45.7  PLT 219   ------------------------------------------------------------------------------------------------------------------  Chemistries  Recent Labs  Lab 11/24/18 1114  NA 141  K 4.1  CL 102  CO2 28  GLUCOSE 105*  BUN 15  CREATININE 0.72  CALCIUM 10.1  AST 29  ALT 22  ALKPHOS 161*  BILITOT 0.7   ------------------------------------------------------------------------------------------------------------------  Cardiac Enzymes Recent Labs  Lab 11/24/18 1114  TROPONINI 0.10*   ------------------------------------------------------------------------------------------------------------------  RADIOLOGY:  Dg Chest 2 View  Result Date: 11/24/2018 CLINICAL DATA:  Chest pain and shortness of breath EXAM: CHEST - 2 VIEW COMPARISON:  06/04/2019 FINDINGS: AP and lateral views of the chest show  hyperexpansion without focal consolidation, edema, or pleural effusion. The cardiopericardial silhouette is within normal limits for size. The visualized bony structures of the thorax are intact. Telemetry leads overlie the chest. IMPRESSION: Hyperexpansion without acute cardiopulmonary findings. Electronically Signed   By: 08/05/2019 M.D.   On: 11/24/2018 12:00   Ct Angio Chest Pe W And/or Wo Contrast  Result Date: 11/24/2018 CLINICAL DATA:  Chest pain. Vomiting. Diarrhea and shortness of breath. Symptoms for 5 days. Smoker with prior heart attack and COPD. EXAM: CT ANGIOGRAPHY CHEST WITH CONTRAST TECHNIQUE: Multidetector CT imaging of the chest was performed using the standard protocol during bolus administration of intravenous contrast. Multiplanar CT image reconstructions and MIPs were obtained to evaluate the vascular anatomy. CONTRAST:  78mL ISOVUE-370 IOPAMIDOL (ISOVUE-370) INJECTION 76% COMPARISON:  Plain film of earlier today. CT 03/26/2018 FINDINGS: Cardiovascular: The quality of this exam for evaluation of pulmonary embolism is good. No evidence of pulmonary embolism. Aortic and branch vessel atherosclerosis. Normal heart size, without pericardial effusion. Multivessel coronary artery atherosclerosis. Mediastinum/Nodes: AP window node measures 1.3 cm on image 39/4 versus 1.6 cm on the prior. Right infrahilar node measures 8 mm today versus 10 mm previously. Left infrahilar node measures 8 mm today versus 11 mm on the prior. Lungs/Pleura: No pleural fluid. Moderate bullous type emphysema. Significantly improved bibasilar aeration. Only minimal residual left lower lobe for consolidation  remains at 1.0 cm on image 83/6. This is likely scarring in setting of prior pneumonia. Bilateral pulmonary nodules are upper lobe predominant and similar. Examples including at 6 mm in the left upper lobe. Other nodules are identified on series 6. Upper Abdomen: Normal imaged portions of the liver, spleen, stomach,  pancreas, adrenal glands, right kidney. Upper pole left renal cyst. The intrahepatic ducts are mildly prominent for age, similar. Common duct is incompletely imaged. Musculoskeletal: No acute osseous abnormality. Review of the MIP images confirms the above findings. IMPRESSION: 1. No evidence of pulmonary embolism. 2. Significantly improved bibasilar aeration. Only minimal residual left lower lobe consolidation remains, likely scarring in setting of prior pneumonia. 3. Emphysema (ICD10-J43.9). Bilateral pulmonary nodules which are similar to 02/05/2016, consistent with a benign etiology. 4. Mild improvement in thoracic adenopathy, likely reactive. 5. Coronary artery atherosclerosis. 6. Intrahepatic ductal prominence is mild and similar to 2017. Most likely within normal variation. This could be correlated with bilirubin level. Aortic Atherosclerosis (ICD10-I70.0). Electronically Signed   By: Jeronimo Greaves M.D.   On: 11/24/2018 13:04      IMPRESSION AND PLAN:   Uncontrolled hyperthyroidism- likely due to Graves disease, given his exophthalmos, although no thyroid enlargement noted on exam. TSH undetectable and T4 elevated. No signs of thyroid storm. -Will check T3 and thyrotropin receptor antibodies -If antibodies are negative, will need radioactive iodine uptake scan (either inpatient or outpatient) to look for other causes of hyperthyroidism -Restart methimazole 30mg  daily -Continue home cardizem and add propranolol IR 60mg  qid, can increase dose as needed -Hydrocortisone 100mg  every 8 hours -Needs PCP, endocrinology, and ophthalmology f/u on discharge  Elevated troponin- likely demand ischemia in the setting of hyperthyroidism. -Trend troponins  Hypertension- BPs elevated in the ED -Hydralazine prn  Rheumatoid arthritis/chronic pain syndrome- stable -Tylenol and oxycodone prn  Tobacco use -Hold on nicotine patch for now due to tachycardia  All the records are reviewed and case discussed  with ED provider. Management plans discussed with the patient, family and they are in agreement.  CODE STATUS: Full  TOTAL TIME TAKING CARE OF THIS PATIENT: 50 minutes.    Mayo M.D on 11/24/2018 at 3:52 PM  Between 7am to 6pm - Pager [phone]  After 6pm go to [email or web address] - Jinny Blossom  [facility]  Office  [phone]  CC: Primary care physician; Patient, No Pcp Per   Note: This dictation was prepared with Dragon dictation along with smaller phrase technology. Any transcriptional errors that result from this process are unintentional.

## 2018-11-24 NOTE — ED Notes (Signed)
Patients heart rate down since propanolol. Md aware another dose ordered. Will continue tomonitor.

## 2018-11-24 NOTE — ED Notes (Signed)
Lab called for add on of t4 free count. will add to already received blood work .

## 2018-11-24 NOTE — ED Notes (Signed)
Awaiting CT angio r/o PE

## 2018-11-24 NOTE — ED Notes (Signed)
Lab called TSH added to blood work in lab.

## 2018-11-24 NOTE — Progress Notes (Signed)
Patient denies having any drug allergies.

## 2018-11-24 NOTE — ED Notes (Signed)
ED TO INPATIENT HANDOFF REPORT  Name/Age/Gender Paul Day 62 y.o. male  Code Status Code Status History    Date Active Date Inactive Code Status Order ID Comments User Context   03/26/2018 2227 03/30/2018 2005 Full Code [id number]  Auburn Bilberry, MD Inpatient   08/19/2016 1524 08/22/2016 1517 Full Code [id number]  Katharina Caper, MD Inpatient   02/11/2016 2237 02/13/2016 1813 Full Code [id number]  Oralia Manis, MD Inpatient      Home/SNF/Other From home   Chief Complaint CP   Level of Care/Admitting Diagnosis ED Disposition    ED Disposition Condition Comment   Admit  Hospital Area: [facility] [[id number]]  Level of Care: Telemetry [5]  Diagnosis: Hyperthyroidism [086578]  Admitting Physician: Willadean Carol DODD [[id number]]  Attending Physician: Willadean Carol DODD [[id number]]  Estimated length of stay: past midnight tomorrow  Certification:: I certify this patient will need inpatient services for at least 2 midnights  PT Class (Do Not Modify): Inpatient [101]  PT Acc Code (Do Not Modify): Private [1]       Medical History Past Medical History:  Diagnosis Date  . Collagen vascular disease (HCC)   . Hyperlipidemia   . Myocardial infarct (HCC)   . Rheumatoid arthritis(714.0)     Allergies Allergies  Allergen Reactions  . Norco [Hydrocodone-Acetaminophen] Itching and Rash    IV Location/Drains/Wounds Patient Lines/Drains/Airways Status   Active Line/Drains/Airways    Name:   Placement date:   Placement time:   Site:   Days:   Peripheral IV 11/24/18 Left Antecubital   11/24/18    1113    Antecubital   less than 1          Labs/Imaging Results for orders placed or performed during the hospital encounter of 11/24/18 (from the past 48 hour(s))  CBC with Differential     Status: Abnormal   Collection Time: 11/24/18 11:14 AM  Result Value Ref Range   WBC 4.6 4.0 - 10.5 K/uL   RBC 5.81 4.22 - 5.81 MIL/uL   Hemoglobin 15.0 13.0 - 17.0 g/dL   HCT 40.1  02.7 - 25.3 %   MCV 78.7 (L) 80.0 - 100.0 fL   MCH 25.8 (L) 26.0 - 34.0 pg   MCHC 32.8 30.0 - 36.0 g/dL   RDW 66.4 40.3 - 47.4 %   Platelets 219 150 - 400 K/uL   nRBC 0.0 0.0 - 0.2 %   Neutrophils Relative % 66 %   Neutro Abs 3.1 1.7 - 7.7 K/uL   Lymphocytes Relative 25 %   Lymphs Abs 1.1 0.7 - 4.0 K/uL   Monocytes Relative 7 %   Monocytes Absolute 0.3 0.1 - 1.0 K/uL   Eosinophils Relative 1 %   Eosinophils Absolute 0.0 0.0 - 0.5 K/uL   Basophils Relative 1 %   Basophils Absolute 0.0 0.0 - 0.1 K/uL   Immature Granulocytes 0 %   Abs Immature Granulocytes 0.01 0.00 - 0.07 K/uL    Comment: Performed at [facility], [address] [place], [place] [address]  Comprehensive metabolic panel     Status: Abnormal   Collection Time: 11/24/18 11:14 AM  Result Value Ref Range   Sodium 141 135 - 145 mmol/L   Potassium 4.1 3.5 - 5.1 mmol/L   Chloride 102 98 - 111 mmol/L   CO2 28 22 - 32 mmol/L   Glucose, Bld 105 (H) 70 - 99 mg/dL   BUN 15 8 - 23 mg/dL   Creatinine, Ser 3.87 0.61 -  1.24 mg/dL   Calcium 76.7 8.9 - 34.1 mg/dL   Total Protein 8.2 (H) 6.5 - 8.1 g/dL   Albumin 4.6 3.5 - 5.0 g/dL   AST 29 15 - 41 U/L   ALT 22 0 - 44 U/L   Alkaline Phosphatase 161 (H) 38 - 126 U/L   Total Bilirubin 0.7 0.3 - 1.2 mg/dL   GFR calc non Af Amer >60 >60 mL/min   GFR calc Af Amer >60 >60 mL/min   Anion gap 11 5 - 15    Comment: Performed at [facility], [address] [place], [place] [address]  Lipase, blood     Status: None   Collection Time: 11/24/18 11:14 AM  Result Value Ref Range   Lipase 27 11 - 51 U/L    Comment: Performed at [facility], [address] [place], [place] [address]  Troponin I - ONCE - STAT     Status: Abnormal   Collection Time: 11/24/18 11:14 AM  Result Value Ref Range   Troponin I 0.10 (HH) <0.03 ng/mL    Comment: CRITICAL RESULT CALLED TO, READ BACK BY AND VERIFIED WITH JEANNETT PEREZ@1150  ON 11/24/18 BY HKP Performed at [facility], [address] [place], [place] [address]   TSH     Status: Abnormal   Collection Time: 11/24/18 11:14 AM  Result Value Ref Range   TSH <0.010 (L) 0.350 - 4.500 uIU/mL    Comment: Performed by a 3rd Generation assay with a functional sensitivity of <=0.01 uIU/mL. Performed at [facility], [address] [place], [place] [address]   T4, free     Status: Abnormal   Collection Time: 11/24/18 11:14 AM  Result Value Ref Range   Free T4 5.14 (H) 0.82 - 1.77 ng/dL    Comment: (NOTE) Biotin ingestion may interfere with free T4 tests. If the results are inconsistent with the TSH level, previous test results, or the clinical presentation, then consider biotin interference. If needed, order repeat testing after stopping biotin. Performed at [facility], [address] [place], [place] [address]   Urinalysis, Complete w Microscopic     Status: Abnormal   Collection Time: 11/24/18 12:12 PM  Result Value Ref Range   Color, Urine YELLOW (A) YELLOW   APPearance CLEAR (A) CLEAR   Specific Gravity, Urine 1.026 1.005 - 1.030   pH 8.0 5.0 - 8.0   Glucose, UA NEGATIVE NEGATIVE mg/dL   Hgb urine dipstick MODERATE (A) NEGATIVE   Bilirubin Urine NEGATIVE NEGATIVE   Ketones, ur 5 (A) NEGATIVE mg/dL   Protein, ur 622 (A) NEGATIVE mg/dL   Nitrite NEGATIVE NEGATIVE   Leukocytes, UA NEGATIVE NEGATIVE   RBC / HPF >50 (H) 0 - 5 RBC/hpf   WBC, UA 0-5 0 - 5 WBC/hpf   Bacteria, UA NONE SEEN NONE SEEN   Squamous Epithelial / LPF 0-5 0 - 5   Mucus PRESENT     Comment: Performed at [facility], [address], [place], [place] [address]  Urine Drug Screen, Qualitative ([facility] only)     Status: None   Collection Time: 11/24/18 12:12 PM  Result Value Ref Range   Tricyclic, Ur Screen NONE DETECTED NONE DETECTED   Amphetamines, Ur Screen NONE DETECTED NONE DETECTED   MDMA (Ecstasy)Ur Screen NONE DETECTED NONE DETECTED   Cocaine Metabolite,Ur [place] NONE DETECTED  NONE DETECTED   Opiate, Ur Screen NONE DETECTED NONE DETECTED   Phencyclidine (PCP) Ur S NONE DETECTED  NONE DETECTED   Cannabinoid 50 Ng, Ur [place] NONE DETECTED NONE DETECTED   Barbiturates, Ur Screen NONE DETECTED NONE DETECTED   Benzodiazepine, Ur Scrn NONE DETECTED NONE DETECTED   Methadone Scn, Ur NONE DETECTED NONE DETECTED    Comment: (NOTE) Tricyclics + metabolites, urine    Cutoff 1000 ng/mL Amphetamines + metabolites, urine  Cutoff 1000 ng/mL MDMA (Ecstasy), urine              Cutoff 500 ng/mL Cocaine Metabolite, urine          Cutoff 300 ng/mL Opiate + metabolites, urine        Cutoff 300 ng/mL Phencyclidine (PCP), urine         Cutoff 25 ng/mL Cannabinoid, urine                 Cutoff 50 ng/mL Barbiturates + metabolites, urine  Cutoff 200 ng/mL Benzodiazepine, urine              Cutoff 200 ng/mL Methadone, urine                   Cutoff 300 ng/mL The urine drug screen provides only a preliminary, unconfirmed analytical test result and should not be used for non-medical purposes. Clinical consideration and professional judgment should be applied to any positive drug screen result due to possible interfering substances. A more specific alternate chemical method must be used in order to obtain a confirmed analytical result. Gas chromatography / mass spectrometry (GC/MS) is the preferred confirmat ory method. Performed at [facility], [address] [place], [place] [address]   Ethanol     Status: None   Collection Time: 11/24/18 12:13 PM  Result Value Ref Range   Alcohol, Ethyl (B) <10 <10 mg/dL    Comment: (NOTE) Lowest detectable limit for serum alcohol is 10 mg/dL. For medical purposes only. Performed at [facility], [address] [place], [place] [address]    Dg Chest 2 View  Result Date: 11/24/2018 CLINICAL DATA:  Chest pain and shortness of breath EXAM: CHEST - 2 VIEW COMPARISON:  06/04/2019 FINDINGS: AP and lateral views of the chest  show hyperexpansion without focal consolidation, edema, or pleural effusion. The cardiopericardial silhouette is within normal limits for size. The visualized bony structures of the thorax are intact. Telemetry leads overlie the chest. IMPRESSION: Hyperexpansion without acute cardiopulmonary findings. Electronically Signed   By: Kennith CenterEric  Mansell M.D.   On: 11/24/2018 12:00   Ct Angio Chest Pe W And/or Wo Contrast  Result Date: 11/24/2018 CLINICAL DATA:  Chest pain. Vomiting. Diarrhea and shortness of breath. Symptoms for 5 days. Smoker with prior heart attack and COPD. EXAM: CT ANGIOGRAPHY CHEST WITH CONTRAST TECHNIQUE: Multidetector CT imaging of the chest was performed using the standard protocol during bolus administration of intravenous contrast. Multiplanar CT image reconstructions and MIPs were obtained to evaluate the vascular anatomy. CONTRAST:  75mL ISOVUE-370 IOPAMIDOL (ISOVUE-370) INJECTION 76% COMPARISON:  Plain film of earlier today. CT 03/26/2018 FINDINGS: Cardiovascular: The quality of this exam for evaluation of pulmonary embolism is good. No evidence of pulmonary embolism. Aortic and branch vessel atherosclerosis. Normal heart size, without pericardial effusion. Multivessel coronary artery atherosclerosis. Mediastinum/Nodes: AP window node measures 1.3 cm on image 39/4 versus 1.6 cm on the prior. Right infrahilar node measures 8 mm today versus 10 mm previously. Left infrahilar node measures 8 mm today versus 11 mm on the prior. Lungs/Pleura: No pleural fluid. Moderate bullous type emphysema. Significantly improved bibasilar aeration.  Only minimal residual left lower lobe for consolidation remains at 1.0 cm on image 83/6. This is likely scarring in setting of prior pneumonia. Bilateral pulmonary nodules are upper lobe predominant and similar. Examples including at 6 mm in the left upper lobe. Other nodules are identified on series 6. Upper Abdomen: Normal imaged portions of the liver, spleen,  stomach, pancreas, adrenal glands, right kidney. Upper pole left renal cyst. The intrahepatic ducts are mildly prominent for age, similar. Common duct is incompletely imaged. Musculoskeletal: No acute osseous abnormality. Review of the MIP images confirms the above findings. IMPRESSION: 1. No evidence of pulmonary embolism. 2. Significantly improved bibasilar aeration. Only minimal residual left lower lobe consolidation remains, likely scarring in setting of prior pneumonia. 3. Emphysema (ICD10-J43.9). Bilateral pulmonary nodules which are similar to 02/05/2016, consistent with a benign etiology. 4. Mild improvement in thoracic adenopathy, likely reactive. 5. Coronary artery atherosclerosis. 6. Intrahepatic ductal prominence is mild and similar to 2017. Most likely within normal variation. This could be correlated with bilirubin level. Aortic Atherosclerosis (ICD10-I70.0). Electronically Signed   By: Jeronimo Greaves M.D.   On: 11/24/2018 13:04    Pending Labs Unresulted Labs (From admission, onward)    Start     Ordered   Signed and Held  CBC  (enoxaparin (LOVENOX)    CrCl >/= 30 ml/min)  Once,   R    Comments:  Baseline for enoxaparin therapy IF NOT ALREADY DRAWN.  Notify MD if PLT < 100 K.    Signed and Held   Signed and Held  Creatinine, serum  (enoxaparin (LOVENOX)    CrCl >/= 30 ml/min)  Once,   R    Comments:  Baseline for enoxaparin therapy IF NOT ALREADY DRAWN.    Signed and Held   Signed and Held  Creatinine, serum  (enoxaparin (LOVENOX)    CrCl >/= 30 ml/min)  Weekly,   R    Comments:  while on enoxaparin therapy    Signed and Held   Signed and Held  Basic metabolic panel  Tomorrow morning,   R     Signed and Held   Signed and Held  CBC  Tomorrow morning,   R     Signed and Held   Signed and Held  Thyroid stimulating immunoglobulin  Once,   R     Signed and Held   Signed and Held  T3  Once,   R     Signed and Held   Signed and Held  Troponin I - Now Then Q6H  Now then every 6 hours,    R     Signed and Held          Vitals/Pain Today's Vitals   11/24/18 1515 11/24/18 1530 11/24/18 1545 11/24/18 1600  BP: (!) 153/85 (!) 167/89 (!) 166/106 (!) 174/102  Pulse: (!) 121 (!) 124 (!) 118 (!) 116  Resp: (!) 33 (!) 27 (!) 32 (!) 30  Temp:      TempSrc:      SpO2: 97% 96% 97% 96%  Weight:      Height:      PainSc:        Isolation Precautions No active isolations  Medications Medications  sodium chloride 0.9 % bolus 1,000 mL (0 mLs Intravenous Stopped 11/24/18 1237)  fentaNYL (SUBLIMAZE) injection 50 mcg (50 mcg Intravenous Given 11/24/18 1126)  ondansetron (ZOFRAN) injection 4 mg (4 mg Intravenous Given 11/24/18 1126)  iopamidol (ISOVUE-370) 76 % injection 75 mL (75 mLs Intravenous  Contrast Given 11/24/18 1238)  fentaNYL (SUBLIMAZE) injection 50 mcg (50 mcg Intravenous Given 11/24/18 1400)  ondansetron (ZOFRAN) injection 4 mg (4 mg Intravenous Given 11/24/18 1400)  propranolol (INDERAL) injection 1 mg (1 mg Intravenous Given 11/24/18 1453)  propranolol (INDERAL) injection 1 mg (1 mg Intravenous Given 11/24/18 1544)   amnbulates

## 2018-11-24 NOTE — Plan of Care (Signed)
Patient is tachycardic on the monitor but improving with medication. Patient complains of unrelieved chest pain. Patient has frequent request for food but only eats a small portion of the food. Patient denies any nausea. Patient is positive for MRSA, interventions initiated.

## 2018-11-24 NOTE — ED Notes (Signed)
Propanolol given over 1 min. Awaiting bed status. Patient has has 2 episodes of vomiting which zofran seems to help but not for long as per patient. Pain med help but does not last very long as per patient. Will continue to monitor patients pain control and heart rate.

## 2018-11-24 NOTE — ED Notes (Signed)
hospitalist at bedside. Second dose propanolol given. Will monitor. Bed status pending.

## 2018-11-24 NOTE — ED Notes (Signed)
Report given to Jeanette RN.

## 2018-11-24 NOTE — ED Triage Notes (Signed)
Pt comes via [facility] from home with c/o chest pain, vomiting, diarrhea and some SHOB. Pt states this has been ongoing chest pain that comes and goes for about 5 days.  Pt has hx of COPD, past heart attack and states he smokes a pack a day.  EMS reports BP-166/90, HR-130, BS-147.  EMS also states fire arrived 84% on RA initially and placed pt on nonrebreather which increased to 100%.  EMS arrived and gave pt breathing treatment and improvement per patient.  Pt able to ambulate off stretcher and into bed. MD at bedside. Pt hooked to monitor with O2 at 99% RA and HR-137.

## 2018-11-25 LAB — BASIC METABOLIC PANEL
Anion gap: 12 (ref 5–15)
BUN: 31 mg/dL — ABNORMAL HIGH (ref 8–23)
CO2: 28 mmol/L (ref 22–32)
Calcium: 9.3 mg/dL (ref 8.9–10.3)
Chloride: 97 mmol/L — ABNORMAL LOW (ref 98–111)
Creatinine, Ser: 0.86 mg/dL (ref 0.61–1.24)
GFR calc non Af Amer: >60 mL/min (ref >60)
Glucose, Bld: 184 mg/dL — ABNORMAL HIGH (ref 70–99)
Potassium: 4.5 mmol/L (ref 3.5–5.1)
SODIUM: 137 mmol/L (ref 135–145)

## 2018-11-25 LAB — CBC
HCT: 42.7 % (ref 39.0–52.0)
Hemoglobin: 14 g/dL (ref 13.0–17.0)
MCH: 25.7 pg — ABNORMAL LOW (ref 26.0–34.0)
MCHC: 32.8 g/dL (ref 30.0–36.0)
MCV: 78.5 fL — ABNORMAL LOW (ref 80.0–100.0)
NRBC: 0 % (ref 0.0–0.2)
PLATELETS: 229 10*3/uL (ref 150–400)
RBC: 5.44 MIL/uL (ref 4.22–5.81)
RDW: 13.5 % (ref 11.5–15.5)
WBC: 5.4 10*3/uL (ref 4.0–10.5)

## 2018-11-25 LAB — TROPONIN I: Troponin I: 0.09 ng/mL (ref <0.03)

## 2018-11-25 MED ORDER — ALBUTEROL SULFATE HFA 108 (90 BASE) MCG/ACT IN AERS: 2.0000 | INHALATION_SPRAY | Freq: Four times a day (QID) | RESPIRATORY_TRACT | 2 refills | Status: AC | PRN

## 2018-11-25 MED ORDER — ENSURE ENLIVE PO LIQD: 237.0000 mL | Freq: Three times a day (TID) | ORAL | 12 refills | Status: AC

## 2018-11-25 MED ORDER — GABAPENTIN 100 MG PO CAPS: 200.0000 mg | ORAL_CAPSULE | Freq: Three times a day (TID) | ORAL | 0 refills | Status: AC

## 2018-11-25 MED ORDER — METHIMAZOLE 10 MG PO TABS: 30.0000 mg | ORAL_TABLET | Freq: Every day | ORAL | 0 refills | Status: AC

## 2018-11-25 MED ORDER — DILTIAZEM HCL 60 MG PO TABS: 60.0000 mg | ORAL_TABLET | Freq: Three times a day (TID) | ORAL | 0 refills | Status: AC

## 2018-11-25 MED ORDER — PROPRANOLOL HCL 60 MG PO TABS: 60.0000 mg | ORAL_TABLET | Freq: Three times a day (TID) | ORAL | 0 refills | Status: AC

## 2018-11-25 MED ORDER — NICOTINE 21 MG/24HR TD PT24: 21.0000 mg | MEDICATED_PATCH | TRANSDERMAL | 0 refills | Status: AC

## 2018-11-25 MED ORDER — AMITRIPTYLINE HCL 50 MG PO TABS: 50.0000 mg | ORAL_TABLET | Freq: Every day | ORAL | 0 refills | Status: AC

## 2018-11-25 NOTE — Care Management Note (Signed)
Case Management Note  Patient Details  Name: Paul Day MRN: [id number] Date of Birth: 01/14/1956  Subjective/Objective:     Patient is being discharged today.  Spoke with patient about getting his medications at [facility].  He is open to them.  He has not been getting his medications filled; has not made an appointment with [facility] for PCP.  He states he will call this morning and make appointment.  RNCM gave him the application for [facility] to [facility] to [facility] today.  He states he lives with his son; neither he or his son drive.  He states he will have a ride tomorrow but does not have one today.  Notified Judeth Cornfield, RN to tell patient he is being discharged today and needs to attempt to find a ride home.  [facility] to [facility].          Action/Plan:   Expected Discharge Date:  11/26/18               Expected Discharge Plan:  Home/Self Care  In-House Referral:     Discharge planning Services  CM Consult, Medication Assistance  Post Acute Care Choice:    Choice offered to:     DME Arranged:    DME Agency:     HH Arranged:    HH Agency:     Status of Service:  Completed, signed off  If discussed at [employer] of Stay Meetings, dates discussed:    Additional Comments:  Sherren Kerns, RN 11/25/2018, 9:47 AM

## 2018-11-25 NOTE — Progress Notes (Signed)
Patient discharged to home. Tele and IV d/c'd prior to discharge. Patient with discharge meds from pharmacy that were picked up by volunteer.  Patient sent home via cab.  Verbalized understanding of discharge instructions.

## 2018-11-25 NOTE — Discharge Summary (Signed)
[facility] [place] [facility]   PATIENT NAME: Paul Day    MR#:  [id number]  DATE OF BIRTH:  04/07/1956  DATE OF ADMISSION:  11/24/2018 ADMITTING PHYSICIAN: Campbell Stall, MD  DATE OF DISCHARGE: 11/25/2018  PRIMARY CARE PHYSICIAN: [facility]   ADMISSION DIAGNOSIS:  Thyrotoxicosis with thyrotoxic crisis, unspecified thyrotoxicosis type [E05.91] Chest pain, unspecified type [R07.9]  DISCHARGE DIAGNOSIS:  Active Problems:   Hyperthyroidism   SECONDARY DIAGNOSIS:   Past Medical History:  Diagnosis Date  . Collagen vascular disease (HCC)   . Hyperlipidemia   . Myocardial infarct (HCC)   . Rheumatoid arthritis(714.0)     HOSPITAL COURSE:  62 year old male with history of rheumatoid arthritis, CAD, tobacco dependence and hypothyroidism who presented due to chest pain and generalized weakness.  1.  Uncontrolled hyperthyroidism: Patient has been off of methimazole for over 9 months. He will continue methimazole 30 mg daily, Cardizem and propanolol He will have outpatient follow-up with Dr. Tedd Sias from endocrinology.  2.  Elevated troponin: This is due to sinus tachycardia with demand ischemia.  Patient was ruled out for ACS.   3. Tobacco dependence: Patient is encouraged to quit smoking. Counseling was provided for 4 minutes. He would like nicotine patch at discharge  4.  Rheumatoid arthritis: Chronic pain syndrome: [facility] 5.  Essential hypertension: Continue propanolol and Cardizem  6.  Severe protein calorie malnutrition in the setting of uncontrolled hyperthyroidism and continue nutritional supplements  DISCHARGE CONDITIONS AND DIET:  stAble for discharge on regular diet   CONSULTS OBTAINED:    DRUG ALLERGIES:   Allergies  Allergen Reactions  . Norco [Hydrocodone-Acetaminophen] Itching and Rash    DISCHARGE MEDICATIONS:   Allergies as of 11/25/2018      Reactions   Norco  [hydrocodone-acetaminophen] Itching, Rash      Medication List    STOP taking these medications   acidophilus Caps capsule     TAKE these medications   albuterol 108 (90 Base) MCG/ACT inhaler Commonly known as:  PROVENTIL HFA;VENTOLIN HFA Inhale 2 puffs into the lungs every 6 (six) hours as needed for wheezing or shortness of breath.   amitriptyline 50 MG tablet Commonly known as:  ELAVIL Take 1 tablet (50 mg total) by mouth at bedtime.   diltiazem 60 MG tablet Commonly known as:  CARDIZEM Take 1 tablet (60 mg total) by mouth 3 (three) times daily. What changed:  when to take this   feeding supplement (ENSURE ENLIVE) Liqd Take 237 mLs by mouth 3 (three) times daily between meals.   gabapentin 100 MG capsule Commonly known as:  NEURONTIN Take 2 capsules (200 mg total) by mouth 3 (three) times daily.   methimazole 10 MG tablet Commonly known as:  TAPAZOLE Take 3 tablets (30 mg total) by mouth daily.   nicotine 21 mg/24hr patch Commonly known as:  NICODERM CQ - dosed in mg/24 hours Place 1 patch (21 mg total) onto the skin daily.   propranolol 60 MG tablet Commonly known as:  INDERAL Take 1 tablet (60 mg total) by mouth 3 (three) times daily.         Today   CHIEF COMPLAINT:   No acute issues overnight    VITAL SIGNS:  Blood pressure 134/77, pulse 99, temperature 98.4 F (36.9 C), temperature source Oral, resp. rate (!) 28, height 5\' 6"  (1.676 m), weight 38.8 kg, SpO2 97 %.   REVIEW OF SYSTEMS:  Review of Systems  Constitutional: Negative.  Negative for chills, fever and malaise/fatigue.  HENT: Negative.  Negative for ear discharge, ear pain, hearing loss, nosebleeds and sore throat.   Eyes: Negative.  Negative for blurred vision and pain.  Respiratory: Negative.  Negative for cough, hemoptysis, shortness of breath and wheezing.   Cardiovascular: Negative.  Negative for chest pain, palpitations and leg swelling.  Gastrointestinal: Negative.  Negative  for abdominal pain, blood in stool, diarrhea, nausea and vomiting.  Genitourinary: Negative.  Negative for dysuria.  Musculoskeletal: Negative.  Negative for back pain.  Skin: Negative.   Neurological: Negative for dizziness, tremors, speech change, focal weakness, seizures and headaches.  Endo/Heme/Allergies: Negative.  Does not bruise/bleed easily.  Psychiatric/Behavioral: Negative.  Negative for depression, hallucinations and suicidal ideas.     PHYSICAL EXAMINATION:  GENERAL:  62 y.o.-year-old patient lying in the bed with no acute distress. Thin frail +exophthalmos bilaterally NECK:  Supple, no jugular venous distention. No thyroid enlargement, no tenderness.  LUNGS: Normal breath sounds bilaterally, no wheezing, rales,rhonchi  No use of accessory muscles of respiration.  CARDIOVASCULAR: S1, S2 normal. No murmurs, rubs, or gallops.  ABDOMEN: Soft, non-tender, non-distended. Bowel sounds present. No organomegaly or mass.  EXTREMITIES: No pedal edema, cyanosis, or clubbing.  PSYCHIATRIC: The patient is alert and oriented x 3.  SKIN: No obvious rash, lesion, or ulcer.   DATA REVIEW:   CBC Recent Labs  Lab 11/25/18 0012  WBC 5.4  HGB 14.0  HCT 42.7  PLT 229    Chemistries  Recent Labs  Lab 11/24/18 1114 11/25/18 0012  NA 141 137  K 4.1 4.5  CL 102 97*  CO2 28 28  GLUCOSE 105* 184*  BUN 15 31*  CREATININE 0.72 0.86  CALCIUM 10.1 9.3  AST 29  --   ALT 22  --   ALKPHOS 161*  --   BILITOT 0.7  --     Cardiac Enzymes Recent Labs  Lab 11/24/18 1114 11/24/18 1915 11/25/18 0012  TROPONINI 0.10* 0.10* 0.09*    Microbiology Results  @[email or web address] @  RADIOLOGY:  Dg Chest 2 View  Result Date: 11/24/2018 CLINICAL DATA:  Chest pain and shortness of breath EXAM: CHEST - 2 VIEW COMPARISON:  06/04/2019 FINDINGS: AP and lateral views of the chest show hyperexpansion without focal consolidation, edema, or pleural effusion. The cardiopericardial silhouette is within  normal limits for size. The visualized bony structures of the thorax are intact. Telemetry leads overlie the chest. IMPRESSION: Hyperexpansion without acute cardiopulmonary findings. Electronically Signed   By: 08/05/2019 M.D.   On: 11/24/2018 12:00   Ct Angio Chest Pe W And/or Wo Contrast  Result Date: 11/24/2018 CLINICAL DATA:  Chest pain. Vomiting. Diarrhea and shortness of breath. Symptoms for 5 days. Smoker with prior heart attack and COPD. EXAM: CT ANGIOGRAPHY CHEST WITH CONTRAST TECHNIQUE: Multidetector CT imaging of the chest was performed using the standard protocol during bolus administration of intravenous contrast. Multiplanar CT image reconstructions and MIPs were obtained to evaluate the vascular anatomy. CONTRAST:  54mL ISOVUE-370 IOPAMIDOL (ISOVUE-370) INJECTION 76% COMPARISON:  Plain film of earlier today. CT 03/26/2018 FINDINGS: Cardiovascular: The quality of this exam for evaluation of pulmonary embolism is good. No evidence of pulmonary embolism. Aortic and branch vessel atherosclerosis. Normal heart size, without pericardial effusion. Multivessel coronary artery atherosclerosis. Mediastinum/Nodes: AP window node measures 1.3 cm on image 39/4 versus 1.6 cm on the prior. Right infrahilar node measures 8 mm today versus 10 mm previously. Left infrahilar node measures 8 mm today versus 11 mm on  the prior. Lungs/Pleura: No pleural fluid. Moderate bullous type emphysema. Significantly improved bibasilar aeration. Only minimal residual left lower lobe for consolidation remains at 1.0 cm on image 83/6. This is likely scarring in setting of prior pneumonia. Bilateral pulmonary nodules are upper lobe predominant and similar. Examples including at 6 mm in the left upper lobe. Other nodules are identified on series 6. Upper Abdomen: Normal imaged portions of the liver, spleen, stomach, pancreas, adrenal glands, right kidney. Upper pole left renal cyst. The intrahepatic ducts are mildly prominent for  age, similar. Common duct is incompletely imaged. Musculoskeletal: No acute osseous abnormality. Review of the MIP images confirms the above findings. IMPRESSION: 1. No evidence of pulmonary embolism. 2. Significantly improved bibasilar aeration. Only minimal residual left lower lobe consolidation remains, likely scarring in setting of prior pneumonia. 3. Emphysema (ICD10-J43.9). Bilateral pulmonary nodules which are similar to 02/05/2016, consistent with a benign etiology. 4. Mild improvement in thoracic adenopathy, likely reactive. 5. Coronary artery atherosclerosis. 6. Intrahepatic ductal prominence is mild and similar to 2017. Most likely within normal variation. This could be correlated with bilirubin level. Aortic Atherosclerosis (ICD10-I70.0). Electronically Signed   By: Jeronimo Greaves M.D.   On: 11/24/2018 13:04      Allergies as of 11/25/2018      Reactions   Norco [hydrocodone-acetaminophen] Itching, Rash      Medication List    STOP taking these medications   acidophilus Caps capsule     TAKE these medications   albuterol 108 (90 Base) MCG/ACT inhaler Commonly known as:  PROVENTIL HFA;VENTOLIN HFA Inhale 2 puffs into the lungs every 6 (six) hours as needed for wheezing or shortness of breath.   amitriptyline 50 MG tablet Commonly known as:  ELAVIL Take 1 tablet (50 mg total) by mouth at bedtime.   diltiazem 60 MG tablet Commonly known as:  CARDIZEM Take 1 tablet (60 mg total) by mouth 3 (three) times daily. What changed:  when to take this   feeding supplement (ENSURE ENLIVE) Liqd Take 237 mLs by mouth 3 (three) times daily between meals.   gabapentin 100 MG capsule Commonly known as:  NEURONTIN Take 2 capsules (200 mg total) by mouth 3 (three) times daily.   methimazole 10 MG tablet Commonly known as:  TAPAZOLE Take 3 tablets (30 mg total) by mouth daily.   nicotine 21 mg/24hr patch Commonly known as:  NICODERM CQ - dosed in mg/24 hours Place 1 patch (21 mg total)  onto the skin daily.   propranolol 60 MG tablet Commonly known as:  INDERAL Take 1 tablet (60 mg total) by mouth 3 (three) times daily.         Management plans discussed with the patient and he is in agreement. Stable for discharge   [facility]  CODE STATUS:     Code Status Orders  (From admission, onward)         Start     Ordered   11/24/18 1824  Full code  Continuous     11/24/18 1824        Code Status History    Date Active Date Inactive Code Status Order ID Comments User Context   03/26/2018 2227 03/30/2018 2005 Full Code [id number]  Auburn Bilberry, MD Inpatient   08/19/2016 1524 08/22/2016 1517 Full Code [id number]  Katharina Caper, MD Inpatient   02/11/2016 2237 02/13/2016 1813 Full Code [id number]  Oralia Manis, MD Inpatient      TOTAL TIME TAKING CARE OF  THIS PATIENT: 38 minutes.    Note: This dictation was prepared with Dragon dictation along with smaller phrase technology. Any transcriptional errors that result from this process are unintentional.  Ventura Leggitt M.D on 11/25/2018 at 11:46 AM  Between 7am to 6pm - Pager - [phone] After 6pm go to [email or web address] - Social research officer, government  [facility]  Office  [phone]  CC: Primary care physician;[facility] [place]

## 2018-11-25 NOTE — Care Management (Addendum)
Volunteer picking up medications at [facility] and delivering to this RNCM.  Will Call for cab when ready for discharge as patient does not have a ride.    [facility] out of Cardizem.  Will have it in stock on 11-26-18.  Patient states he can get a ride to pick up Cardizem tomorrow.  Misty Stanley, Pharmacist will supply patient with a few to hold him over until then.  Patient verbalizes understanding.

## 2018-11-26 LAB — T3: T3, Total: 412 ng/dL — ABNORMAL HIGH (ref 71–180)

## 2018-11-30 LAB — THYROID STIMULATING IMMUNOGLOBULIN: Thyroid Stimulating Immunoglob: 16 IU/L — ABNORMAL HIGH (ref 0.00–0.55)
